# Patient Record
Sex: Female | Born: 1970 | Race: Black or African American | Hispanic: No | Marital: Married | Smoking: Never smoker
Health system: Southern US, Community
[De-identification: ages and names within clinical notes are randomized; demographics above are authoritative.]

## PROBLEM LIST (undated history)

## (undated) DIAGNOSIS — D219 Benign neoplasm of connective and other soft tissue, unspecified: Secondary | ICD-10-CM

## (undated) DIAGNOSIS — F329 Major depressive disorder, single episode, unspecified: Secondary | ICD-10-CM

## (undated) DIAGNOSIS — I10 Essential (primary) hypertension: Secondary | ICD-10-CM

## (undated) DIAGNOSIS — I639 Cerebral infarction, unspecified: Secondary | ICD-10-CM

## (undated) DIAGNOSIS — G10 Huntington's disease: Secondary | ICD-10-CM

## (undated) DIAGNOSIS — F419 Anxiety disorder, unspecified: Secondary | ICD-10-CM

## (undated) DIAGNOSIS — F32A Depression, unspecified: Secondary | ICD-10-CM

## (undated) HISTORY — PX: CHOLECYSTECTOMY: SHX55

## (undated) HISTORY — PX: LYMPH NODE DISSECTION: SHX5087

---

## 2008-04-10 ENCOUNTER — Emergency Department (HOSPITAL_COMMUNITY): Admission: EM | Admit: 2008-04-10 | Discharge: 2008-04-10 | Payer: Self-pay | Admitting: Emergency Medicine

## 2008-12-19 ENCOUNTER — Emergency Department (HOSPITAL_COMMUNITY): Admission: EM | Admit: 2008-12-19 | Discharge: 2008-12-19 | Payer: Self-pay | Admitting: Emergency Medicine

## 2009-03-27 ENCOUNTER — Emergency Department (HOSPITAL_COMMUNITY): Admission: EM | Admit: 2009-03-27 | Discharge: 2009-03-27 | Payer: Self-pay | Admitting: Emergency Medicine

## 2009-06-22 ENCOUNTER — Emergency Department (HOSPITAL_COMMUNITY): Admission: EM | Admit: 2009-06-22 | Discharge: 2009-06-22 | Payer: Self-pay | Admitting: Emergency Medicine

## 2010-01-01 ENCOUNTER — Emergency Department (HOSPITAL_COMMUNITY): Admission: EM | Admit: 2010-01-01 | Discharge: 2010-01-02 | Payer: Self-pay | Admitting: Emergency Medicine

## 2010-03-14 ENCOUNTER — Emergency Department (HOSPITAL_COMMUNITY): Admission: EM | Admit: 2010-03-14 | Discharge: 2010-03-15 | Payer: Self-pay | Admitting: Emergency Medicine

## 2010-05-02 ENCOUNTER — Emergency Department (HOSPITAL_COMMUNITY)
Admission: EM | Admit: 2010-05-02 | Discharge: 2010-05-02 | Payer: Self-pay | Source: Home / Self Care | Admitting: Emergency Medicine

## 2010-08-13 LAB — WET PREP, GENITAL
Clue Cells Wet Prep HPF POC: NONE SEEN
WBC, Wet Prep HPF POC: NONE SEEN
Yeast Wet Prep HPF POC: NONE SEEN

## 2010-08-13 LAB — URINALYSIS, ROUTINE W REFLEX MICROSCOPIC
Bilirubin Urine: NEGATIVE
Hgb urine dipstick: NEGATIVE
Specific Gravity, Urine: 1.034 — ABNORMAL HIGH (ref 1.005–1.030)
pH: 6 (ref 5.0–8.0)

## 2010-08-13 LAB — GC/CHLAMYDIA PROBE AMP, GENITAL: Chlamydia, DNA Probe: NEGATIVE

## 2010-08-15 LAB — CBC
HCT: 38 % (ref 36.0–46.0)
Hemoglobin: 12.6 g/dL (ref 12.0–15.0)
RDW: 13 % (ref 11.5–15.5)
WBC: 7.9 10*3/uL (ref 4.0–10.5)

## 2010-08-15 LAB — URINALYSIS, ROUTINE W REFLEX MICROSCOPIC
Bilirubin Urine: NEGATIVE
Glucose, UA: 100 mg/dL — AB
Hgb urine dipstick: NEGATIVE
Protein, ur: NEGATIVE mg/dL

## 2010-08-15 LAB — GC/CHLAMYDIA PROBE AMP, GENITAL: GC Probe Amp, Genital: NEGATIVE

## 2010-08-15 LAB — DIFFERENTIAL
Basophils Absolute: 0 10*3/uL (ref 0.0–0.1)
Lymphocytes Relative: 19 % (ref 12–46)
Monocytes Absolute: 0.4 10*3/uL (ref 0.1–1.0)
Monocytes Relative: 5 % (ref 3–12)
Neutro Abs: 6 10*3/uL (ref 1.7–7.7)

## 2010-08-15 LAB — POCT I-STAT, CHEM 8
BUN: 8 mg/dL (ref 6–23)
Calcium, Ion: 1.09 mmol/L — ABNORMAL LOW (ref 1.12–1.32)
Chloride: 104 mEq/L (ref 96–112)

## 2010-08-15 LAB — WET PREP, GENITAL

## 2010-08-16 LAB — POCT I-STAT, CHEM 8
Calcium, Ion: 1.19 mmol/L (ref 1.12–1.32)
Glucose, Bld: 199 mg/dL — ABNORMAL HIGH (ref 70–99)
HCT: 39 % (ref 36.0–46.0)
Hemoglobin: 13.3 g/dL (ref 12.0–15.0)
TCO2: 28 mmol/L (ref 0–100)

## 2010-08-16 LAB — URINALYSIS, ROUTINE W REFLEX MICROSCOPIC
Leukocytes, UA: NEGATIVE
Nitrite: NEGATIVE
Specific Gravity, Urine: >1.046 — ABNORMAL HIGH (ref 1.005–1.030)
Urobilinogen, UA: 1 mg/dL (ref 0.0–1.0)

## 2010-08-16 LAB — URINE MICROSCOPIC-ADD ON

## 2010-08-21 ENCOUNTER — Emergency Department (HOSPITAL_COMMUNITY)
Admission: EM | Admit: 2010-08-21 | Discharge: 2010-08-21 | Disposition: A | Discharge status: Home / Self Care | Payer: Worker's Compensation | Attending: Emergency Medicine | Admitting: Emergency Medicine

## 2010-08-21 ENCOUNTER — Emergency Department (HOSPITAL_COMMUNITY): Payer: Worker's Compensation

## 2010-08-21 DIAGNOSIS — Y9229 Other specified public building as the place of occurrence of the external cause: Secondary | ICD-10-CM | POA: Insufficient documentation

## 2010-08-21 DIAGNOSIS — M79609 Pain in unspecified limb: Secondary | ICD-10-CM | POA: Insufficient documentation

## 2010-08-21 DIAGNOSIS — I10 Essential (primary) hypertension: Secondary | ICD-10-CM | POA: Insufficient documentation

## 2010-08-21 DIAGNOSIS — IMO0002 Reserved for concepts with insufficient information to code with codable children: Secondary | ICD-10-CM | POA: Insufficient documentation

## 2010-08-21 DIAGNOSIS — E119 Type 2 diabetes mellitus without complications: Secondary | ICD-10-CM | POA: Insufficient documentation

## 2010-08-21 DIAGNOSIS — S93609A Unspecified sprain of unspecified foot, initial encounter: Secondary | ICD-10-CM | POA: Insufficient documentation

## 2010-08-21 DIAGNOSIS — S93409A Sprain of unspecified ligament of unspecified ankle, initial encounter: Secondary | ICD-10-CM | POA: Insufficient documentation

## 2010-08-21 DIAGNOSIS — W108XXA Fall (on) (from) other stairs and steps, initial encounter: Secondary | ICD-10-CM | POA: Insufficient documentation

## 2010-08-21 DIAGNOSIS — M25579 Pain in unspecified ankle and joints of unspecified foot: Secondary | ICD-10-CM | POA: Insufficient documentation

## 2010-08-21 DIAGNOSIS — M25569 Pain in unspecified knee: Secondary | ICD-10-CM | POA: Insufficient documentation

## 2010-08-21 DIAGNOSIS — X500XXA Overexertion from strenuous movement or load, initial encounter: Secondary | ICD-10-CM | POA: Insufficient documentation

## 2010-09-08 LAB — URINALYSIS, ROUTINE W REFLEX MICROSCOPIC
Bilirubin Urine: NEGATIVE
Hgb urine dipstick: NEGATIVE
Ketones, ur: NEGATIVE mg/dL
Protein, ur: NEGATIVE mg/dL
Urobilinogen, UA: 0.2 mg/dL (ref 0.0–1.0)

## 2010-09-23 ENCOUNTER — Other Ambulatory Visit: Payer: Self-pay | Admitting: Obstetrics and Gynecology

## 2010-10-02 ENCOUNTER — Other Ambulatory Visit: Payer: Self-pay | Admitting: Obstetrics and Gynecology

## 2010-10-02 ENCOUNTER — Ambulatory Visit
Admission: RE | Admit: 2010-10-02 | Discharge: 2010-10-02 | Disposition: A | Discharge status: Home / Self Care | Payer: Worker's Compensation | Source: Ambulatory Visit | Attending: Obstetrics and Gynecology | Admitting: Obstetrics and Gynecology

## 2010-11-18 ENCOUNTER — Encounter: Payer: BC Managed Care – PPO | Attending: Endocrinology

## 2010-11-18 DIAGNOSIS — E119 Type 2 diabetes mellitus without complications: Secondary | ICD-10-CM | POA: Insufficient documentation

## 2010-11-18 DIAGNOSIS — Z713 Dietary counseling and surveillance: Secondary | ICD-10-CM | POA: Insufficient documentation

## 2010-12-10 ENCOUNTER — Encounter: Payer: Worker's Compensation | Attending: Endocrinology

## 2010-12-10 DIAGNOSIS — E119 Type 2 diabetes mellitus without complications: Secondary | ICD-10-CM | POA: Insufficient documentation

## 2010-12-10 DIAGNOSIS — Z713 Dietary counseling and surveillance: Secondary | ICD-10-CM | POA: Insufficient documentation

## 2010-12-12 NOTE — Progress Notes (Signed)
  Patient was seen on 12/10/2010 for the second of a series of three diabetes self-management courses at the [facility]. The following learning objectives were met by the patient during this course:   States the relationship of exercise to blood glucose  States benefits/barriers of regular and safe exercise  States three guidelines for safe and effective exercise  Describes personal diabetes medicine regimen  Describes actions of own medications  Describes causes, symptoms, and treatment of hypo/hyperglycemia  Describes sick day rules  Identifies when to test urine for ketones when appropriate  Identifies when to call healthcare provider for acute complications  States the risk for problems with foot, skin, and dental care  States preventative foot, skin, and dental care measures  States when to call healthcare provider regarding foot, skin, and dental care  Identifies methods for evaluation of diabetes control  Discusses benefits of SBGM  Identifies relationship between nutrition, exercise, medication, and glucose levels  Discusses the importance of record keeping  Follow-Up Plan: Patient will attend the final class of the ADA Diabetes Self-Care Education.

## 2011-03-04 LAB — CBC
HCT: 39.7
Hemoglobin: 13.4
MCV: 94.2
Platelets: 304
RDW: 12

## 2011-03-04 LAB — DIFFERENTIAL
Basophils Absolute: 0
Basophils Relative: 0
Lymphocytes Relative: 8 — ABNORMAL LOW
Monocytes Absolute: 0.4
Monocytes Relative: 3
Neutro Abs: 12.1 — ABNORMAL HIGH
Neutrophils Relative %: 89 — ABNORMAL HIGH

## 2011-03-04 LAB — URINALYSIS, ROUTINE W REFLEX MICROSCOPIC
Hgb urine dipstick: NEGATIVE
Protein, ur: NEGATIVE
Specific Gravity, Urine: 1.03
Urobilinogen, UA: 1

## 2011-03-04 LAB — COMPREHENSIVE METABOLIC PANEL
Albumin: 4
BUN: 8
Creatinine, Ser: 0.84
Glucose, Bld: 189 — ABNORMAL HIGH
Total Bilirubin: 0.7
Total Protein: 6.9

## 2011-03-04 LAB — GLUCOSE, CAPILLARY: Glucose-Capillary: 196 — ABNORMAL HIGH

## 2011-03-04 LAB — POCT PREGNANCY, URINE: Preg Test, Ur: NEGATIVE

## 2011-04-11 ENCOUNTER — Encounter: Payer: Self-pay | Admitting: Emergency Medicine

## 2011-04-11 ENCOUNTER — Emergency Department (HOSPITAL_COMMUNITY)
Admission: EM | Admit: 2011-04-11 | Discharge: 2011-04-11 | Disposition: A | Discharge status: Home / Self Care | Payer: Worker's Compensation | Attending: Emergency Medicine | Admitting: Emergency Medicine

## 2011-04-11 DIAGNOSIS — I10 Essential (primary) hypertension: Secondary | ICD-10-CM | POA: Insufficient documentation

## 2011-04-11 DIAGNOSIS — E119 Type 2 diabetes mellitus without complications: Secondary | ICD-10-CM | POA: Insufficient documentation

## 2011-04-11 DIAGNOSIS — M542 Cervicalgia: Secondary | ICD-10-CM | POA: Insufficient documentation

## 2011-04-11 DIAGNOSIS — Y92410 Unspecified street and highway as the place of occurrence of the external cause: Secondary | ICD-10-CM | POA: Insufficient documentation

## 2011-04-11 DIAGNOSIS — T148XXA Other injury of unspecified body region, initial encounter: Secondary | ICD-10-CM

## 2011-04-11 HISTORY — DX: Essential (primary) hypertension: I10

## 2011-04-11 MED ORDER — IBUPROFEN 800 MG PO TABS: 800.0000 mg | ORAL_TABLET | Freq: Three times a day (TID) | ORAL | Status: AC | PRN

## 2011-04-11 MED ORDER — METHOCARBAMOL 500 MG PO TABS: 1000.0000 mg | ORAL_TABLET | Freq: Four times a day (QID) | ORAL | Status: AC
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
## 2011-04-11 NOTE — ED Notes (Signed)
Right neck pain with radiation to shoulder.  Aleve this AM and helped mildly. 4/10.
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
## 2011-04-12 NOTE — ED Provider Notes (Signed)
Medical screening examination/treatment/procedure(s) were performed by non-physician practitioner and as supervising physician I was immediately available for consultation/collaboration.  Doug Sou, MD 04/12/11 [phone]

## 2011-08-03 ENCOUNTER — Emergency Department (HOSPITAL_COMMUNITY)
Admission: EM | Admit: 2011-08-03 | Discharge: 2011-08-03 | Disposition: A | Discharge status: Home / Self Care | Payer: Self-pay | Attending: Emergency Medicine | Admitting: Emergency Medicine

## 2011-08-03 ENCOUNTER — Encounter (HOSPITAL_COMMUNITY): Payer: Self-pay | Admitting: *Deleted

## 2011-08-03 DIAGNOSIS — H9209 Otalgia, unspecified ear: Secondary | ICD-10-CM | POA: Insufficient documentation

## 2011-08-03 DIAGNOSIS — I10 Essential (primary) hypertension: Secondary | ICD-10-CM | POA: Insufficient documentation

## 2011-08-03 DIAGNOSIS — R51 Headache: Secondary | ICD-10-CM | POA: Insufficient documentation

## 2011-08-03 DIAGNOSIS — G629 Polyneuropathy, unspecified: Secondary | ICD-10-CM

## 2011-08-03 DIAGNOSIS — E119 Type 2 diabetes mellitus without complications: Secondary | ICD-10-CM | POA: Insufficient documentation

## 2011-08-03 DIAGNOSIS — G589 Mononeuropathy, unspecified: Secondary | ICD-10-CM | POA: Insufficient documentation

## 2011-08-03 DIAGNOSIS — J029 Acute pharyngitis, unspecified: Secondary | ICD-10-CM | POA: Insufficient documentation

## 2011-08-03 LAB — GLUCOSE, CAPILLARY

## 2011-08-03 LAB — RAPID STREP SCREEN (MED CTR MEBANE ONLY): Streptococcus, Group A Screen (Direct): NEGATIVE

## 2011-08-03 MED ORDER — HYDROCODONE-ACETAMINOPHEN 5-325 MG PO TABS: 1.0000 | ORAL_TABLET | ORAL | Status: AC | PRN

## 2011-08-03 NOTE — ED Provider Notes (Signed)
History     CSN: [id number]  Arrival date & time 08/03/11  4098   First MD Initiated Contact with Patient 08/03/11 1919      Chief Complaint  Patient presents with  . multiple complaints     (Consider location/radiation/quality/duration/timing/severity/associated sxs/prior treatment) HPI The patient complains of headache, earache and throat pain for the last 4 days. She feels the right side of her face and neck area is swollen. She feels like the glands in that area are enlarged as well. She also has noted that about 3 or 4 days ago 3 toes on her left foot are numb. That has subsequently resolved. She denies any trouble with her speech, coordination, balance. She has had a head cold as well for the last few days. She denies abdominal pain or chest pain. She has no history of stroke. Past Medical History  Diagnosis Date  . Hypertension   . Diabetes mellitus     Past Surgical History  Procedure Date  . Cholecystectomy     History reviewed. No pertinent family history.  History  Substance Use Topics  . Smoking status: Never Smoker   . Smokeless tobacco: Never Used  . Alcohol Use: No    OB History    Grav Para Term Preterm Abortions TAB SAB Ect Mult Living                  Review of Systems  All other systems reviewed and are negative.    Allergies  Review of patient's allergies indicates no known allergies.  Home Medications   Current Outpatient Rx  Name Route Sig Dispense Refill  . ASPIRIN EC 81 MG PO TBEC Oral Take 81 mg by mouth daily.      Marland Kitchen VITAMIN D PO Oral Take 1 tablet by mouth daily.    Marland Kitchen CINNAMON PO Oral Take 1 tablet by mouth 2 (two) times daily.    Marland Kitchen VITAMIN B-12 PO Oral Take 1 tablet by mouth daily.    . OMEGA-3 FATTY ACIDS 1000 MG PO CAPS Oral Take 1 g by mouth daily.    Marland Kitchen GLIPIZIDE ER 10 MG PO TB24 Oral Take 10 mg by mouth daily.    Marland Kitchen LISINOPRIL-HYDROCHLOROTHIAZIDE 10-12.5 MG PO TABS Oral Take 1 tablet by mouth daily.        BP 129/76  Pulse  100  Temp(Src) 98.8 F (37.1 C) (Oral)  Resp 18  SpO2 98%  LMP 08/01/2011  Physical Exam  Nursing note and vitals reviewed. Constitutional: She appears well-developed and well-nourished. No distress.  HENT:  Head: Normocephalic and atraumatic.  Right Ear: Tympanic membrane, external ear and ear canal normal.  Left Ear: Tympanic membrane, external ear and ear canal normal.  Eyes: Conjunctivae are normal. Right eye exhibits no discharge. Left eye exhibits no discharge. No scleral icterus.  Neck: Neck supple. No rigidity. No tracheal deviation and no edema present.       Tenderness anterior cervical chain primarily on the right side, no significant lymphadenopathy appreciated  Cardiovascular: Normal rate, regular rhythm and intact distal pulses.   Pulmonary/Chest: Effort normal and breath sounds normal. No stridor. No respiratory distress. She has no wheezes. She has no rales.  Abdominal: Soft. Bowel sounds are normal. She exhibits no distension. There is no tenderness. There is no rebound and no guarding.  Musculoskeletal: She exhibits no edema and no tenderness.  Neurological: She is alert. She has normal strength. She displays no atrophy and no tremor. No cranial nerve deficit (  no gross defecits noted) or sensory deficit. She exhibits normal muscle tone. She displays no seizure activity. Coordination and gait normal. GCS eye subscore is 4. GCS verbal subscore is 5. GCS motor subscore is 6.       Normal sensation throughout, 5 over 5 strength upper extremities and lower extremities, extraocular concern tach, no visual field defects,  Skin: Skin is warm and dry. No rash noted.  Psychiatric: She has a normal mood and affect.    ED Course  Procedures (including critical care time)  Labs Reviewed  GLUCOSE, CAPILLARY - Abnormal; Notable for the following:    Glucose-Capillary 132 (*)    All other components within normal limits  RAPID STREP SCREEN  LAB REPORT - SCANNED   No results  found.    MDM  Patient mentions an incidental numbness in her toes. There is no evidence to suggest stroke. I doubt TIA. She does have diabetes and may be developing neuropathy. At this time I do not feel there is an acute emergency medical condition associated with this complaint do recommend that she followup with her primary doctor.  Patient does have pharyngitis. Will check a strep test. It is possible that this is a viral type infection. There is no evidence of otitis externa or otitis media. There is no abscess on her exam        Celene Kras, MD 08/05/11 [phone]

## 2011-08-03 NOTE — ED Notes (Signed)
CBG=132 mg/ml

## 2011-08-03 NOTE — ED Notes (Signed)
Received bedside report from [place], [place].  Patient currently sitting up in bed; no respiratory or acute distress noted.  Introduced self to patient and updated patient on plan of care; informed patient that EDP will be in to assess patient.  Patient has no other questions or concerns at this time; will continue to monitor.

## 2011-08-03 NOTE — ED Provider Notes (Signed)
Hayley Bryant S 8:00 PM Patient discussed in sign out with Dr. Lynelle Doctor. Patient with complaints of earache, sore throat mild general fatigue and left toes numbness and tingling. Strep screen pending. Patient's sugars day unremarkable. She is known diabetic. Patient had normal nonfocal neurologic exam. Numbness in toes thought possibly to be related to diabetes and neuropathy. No concerning findings on exam for PTA or retropharyngeal abscess. Plan, if strep screen negative give symptomatic treatment. If strep screen positive give appropriate antibiotic treatment.   8:30 PM strep screen is negative. I discussed results with patient and treatment plan for symptomatic treatment. She will followup with PCP.  Angus Seller, [place] 08/03/11 2047

## 2011-08-03 NOTE — ED Notes (Signed)
Patient currently resting quietly in bed; no respiratory or acute distress noted; patient updated on plan of care; informed patient that we are currently waiting for strep screen to come back.  Patient given water; OKed by Dr. Lynelle Doctor.  Patient has no other questions or concerns at this time; will continue to monitor.

## 2011-08-03 NOTE — ED Notes (Signed)
Pt c/o a rt sided headache earache and throat pain for 4 days and her beck is swiollen.  She also had 3 toes numb on her lt foot 3-4 days ago .  Not now

## 2011-08-03 NOTE — ED Notes (Addendum)
Pt c/o "head cold," and pain to (R) side of head, throat, neck, and ear, non-productive cough, and swelling to (R) side of neck x4-5 days. Pt also reports numbness to first 3 toes on (L) foot 3-4 days ago for approx 90 mins, denies any numbness today but had brief episode of pain to (L) great toe yesterday

## 2011-08-03 NOTE — ED Notes (Addendum)
Pt reports her (R) ear pain is the only thing hurting at this moment, head and jaw pain has subsided, throat hurts only when she swallows

## 2011-08-03 NOTE — Discharge Instructions (Signed)
You were seen and evaluated today for your symptoms of earache, sore throat and toe numbness. At this time your strep throat test was negative. Your providers today feel that you're here in throat symptoms are likely caused from a viral infection. Your symptoms should improve with rest over the next few days. You may use Tylenol or ibuprofen during the day for your symptoms of pain. You were given a prescription for stronger pain medicine to help you at night if you have difficulty sleeping due to your symptoms of pain. Do not drive or operate machinery while taking this medicine as it may cause you to be drowsy. Drink plenty of fluids to stay hydrated. You develop any worsening symptoms, fever, chills, and difficulty swallowing or breathing please return to the emergency room. Continue to followup with your primary care provider for your diabetes treatment and continued evaluation of your numbness in your toes. Providers today feel this may be related to your diabetes but may also require further workup by her primary doctor.  Viral Infections A viral infection can be caused by different types of viruses.Most viral infections are not serious and resolve on their own. However, some infections may cause severe symptoms and may lead to further complications. SYMPTOMS Viruses can frequently cause:  Minor sore throat.   Aches and pains.   Headaches.   Runny nose.   Different types of rashes.   Watery eyes.   Tiredness.   Cough.   Loss of appetite.   Gastrointestinal infections, resulting in nausea, vomiting, and diarrhea.  These symptoms do not respond to antibiotics because the infection is not caused by bacteria. However, you might catch a bacterial infection following the viral infection. This is sometimes called a "superinfection." Symptoms of such a bacterial infection may include:  Worsening sore throat with pus and difficulty swallowing.   Swollen neck glands.   Chills and a high  or persistent fever.   Severe headache.   Tenderness over the sinuses.   Persistent overall ill feeling (malaise), muscle aches, and tiredness (fatigue).   Persistent cough.   Yellow, green, or brown mucus production with coughing.  HOME CARE INSTRUCTIONS   Only take over-the-counter or prescription medicines for pain, discomfort, diarrhea, or fever as directed by your caregiver.   Drink enough water and fluids to keep your urine clear or pale yellow. Sports drinks can provide valuable electrolytes, sugars, and hydration.   Get plenty of rest and maintain proper nutrition. Soups and broths with crackers or rice are fine.  SEEK IMMEDIATE MEDICAL CARE IF:   You have severe headaches, shortness of breath, chest pain, neck pain, or an unusual rash.   You have uncontrolled vomiting, diarrhea, or you are unable to keep down fluids.   You or your child has an oral temperature above 102 F (38.9 C), not controlled by medicine.   Your baby is older than 3 months with a rectal temperature of 102 F (38.9 C) or higher.   Your baby is 18 months old or younger with a rectal temperature of 100.4 F (38 C) or higher.  MAKE SURE YOU:   Understand these instructions.   Will watch your condition.   Will get help right away if you are not doing well or get worse.  Document Released: 02/26/2005 Document Revised: 05/08/2011 Document Reviewed: 09/23/2010 [facility] Patient Information 2012 [place], [place].  Sore Throat A sore throat is felt inside the throat and at the back of the mouth. It hurts to swallow or  the throat may feel dry and scratchy. It can be caused by germs, smoking, pollution, or allergies.  HOME CARE   Only take medicine as told by your doctor.   Drink enough fluids to keep your pee (urine) clear or pale yellow.   Eat soft foods.   Do not smoke.   Rinse the mouth (gargle) with warm water or salt water ( teaspoon salt in 8 ounces of water).   Try throat sprays,  lozenges, or suck on hard candy.  GET HELP RIGHT AWAY IF:   You have trouble breathing.   Your sore throat lasts longer than 1 week.   There is more puffiness (swelling) in the throat.   The pain is so bad that you are unable to swallow.   You have a very bad headache or a red rash.   You start to throw up (vomit).   You or your child has a temperature by mouth above 102 F (38.9 C), not controlled by medicine.   Your baby is older than 3 months with a rectal temperature of 102 F (38.9 C) or higher.   Your baby is 30 months old or younger with a rectal temperature of 100.4 F (38 C) or higher.  MAKE SURE YOU:   Understand these instructions.   Will watch your condition.   Will get help right away if you are not doing well or get worse.  Document Released: 02/26/2008 Document Revised: 05/08/2011 Document Reviewed: 02/26/2008 [facility] Patient Information 2012 [place], [place].  Otalgia The most common reason for this in children is an infection of the middle ear. Pain from the middle ear is usually caused by a build-up of fluid and pressure behind the eardrum. Pain from an earache can be sharp, dull, or burning. The pain may be temporary or constant. The middle ear is connected to the nasal passages by a short narrow tube called the Eustachian tube. The Eustachian tube allows fluid to drain out of the middle ear, and helps keep the pressure in your ear equalized. CAUSES  A cold or allergy can block the Eustachian tube with inflammation and the build-up of secretions. This is especially likely in small children, because their Eustachian tube is shorter and more horizontal. When the Eustachian tube closes, the normal flow of fluid from the middle ear is stopped. Fluid can accumulate and cause stuffiness, pain, hearing loss, and an ear infection if germs start growing in this area. SYMPTOMS  The symptoms of an ear infection may include fever, ear pain, fussiness, increased crying, and  irritability. Many children will have temporary and minor hearing loss during and right after an ear infection. Permanent hearing loss is rare, but the risk increases the more infections a child has. Other causes of ear pain include retained water in the outer ear canal from swimming and bathing. Ear pain in adults is less likely to be from an ear infection. Ear pain may be referred from other locations. Referred pain may be from the joint between your jaw and the skull. It may also come from a tooth problem or problems in the neck. Other causes of ear pain include:  A foreign body in the ear.   Outer ear infection.   Sinus infections.   Impacted ear wax.   Ear injury.   Arthritis of the jaw or TMJ problems.   Middle ear infection.   Tooth infections.   Sore throat with pain to the ears.  DIAGNOSIS  Your caregiver can usually make  the diagnosis by examining you. Sometimes other special studies, including x-rays and lab work may be necessary. TREATMENT   If antibiotics were prescribed, use them as directed and finish them even if you or your child's symptoms seem to be improved.   Sometimes PE tubes are needed in children. These are little plastic tubes which are put into the eardrum during a simple surgical procedure. They allow fluid to drain easier and allow the pressure in the middle ear to equalize. This helps relieve the ear pain caused by pressure changes.  HOME CARE INSTRUCTIONS   Only take over-the-counter or prescription medicines for pain, discomfort, or fever as directed by your caregiver. DO NOT GIVE CHILDREN ASPIRIN because of the association of Reye's Syndrome in children taking aspirin.   Use a cold pack applied to the outer ear for 15 to 20 minutes, 3 to 4 times per day or as needed may reduce pain. Do not apply ice directly to the skin. You may cause frost bite.   Over-the-counter ear drops used as directed may be effective. Your caregiver may sometimes prescribe ear  drops.   Resting in an upright position may help reduce pressure in the middle ear and relieve pain.   Ear pain caused by rapidly descending from high altitudes can be relieved by swallowing or chewing gum. Allowing infants to suck on a bottle during airplane travel can help.   Do not smoke in the house or near children. If you are unable to quit smoking, smoke outside.   Control allergies.  SEEK IMMEDIATE MEDICAL CARE IF:   You or your child are becoming sicker.   Pain or fever relief is not obtained with medicine.   You or your child's symptoms (pain, fever, or irritability) do not improve within 24 to 48 hours or as instructed.   Severe pain suddenly stops hurting. This may indicate a ruptured eardrum.   You or your children develop new problems such as severe headaches, stiff neck, difficulty swallowing, or swelling of the face or around the ear.  Document Released: 01/04/2004 Document Revised: 05/08/2011 Document Reviewed: 05/10/2008 [facility] Patient Information 2012 [place], [place].     Peripheral Nerve Problems Peripheral nerve disorders are problems with the nerves in your arms or legs. CAUSES  There are many different causes of these disorders. They include:  Injury.   Diabetes.   Chronic alcoholism.   Toxic chemicals and drugs.   Vitamin deficiencies.   Tumors.   Liver or kidney diseases.  SYMPTOMS  Some of the problems caused are:  Tingling, burning, pain, and numbness in the extremities and feet.   Weakness, loss of muscle tone, and size.  DIAGNOSIS  Sometimes blood tests and studies to examine nerve function are needed to make the diagnosis.  TREATMENT  Sometimes peripheral nerve problems can be treated with vitamins, medication, and avoiding known toxins such as alcohol. Please make a follow-up appointment to be sure you are getting better with treatment.  SEEK MEDICAL CARE IF:   You are not better after one week of treatment.   You have  worsening of problems or have breathing trouble.  Document Released: 06/26/2004 Document Revised: 05/08/2011 Document Reviewed: 05/19/2005 [facility] Patient Information 2012 [place], [place].

## 2011-08-05 NOTE — ED Provider Notes (Signed)
Medical screening examination/treatment/procedure(s) were conducted as a shared visit with non-physician practitioner(s) and myself.  I personally evaluated the patient during the encounter    Celene Kras, MD 08/05/11 [phone]

## 2011-10-10 IMAGING — CR DG ANKLE COMPLETE 3+V*R*
3 series · 3 of 3 positions shown · non-contrast
Comparison: None.

CLINICAL DATA: Right ankle pain.  Fall.

RIGHT ANKLE - COMPLETE 3+ VIEW

[t ankle joint ap right]
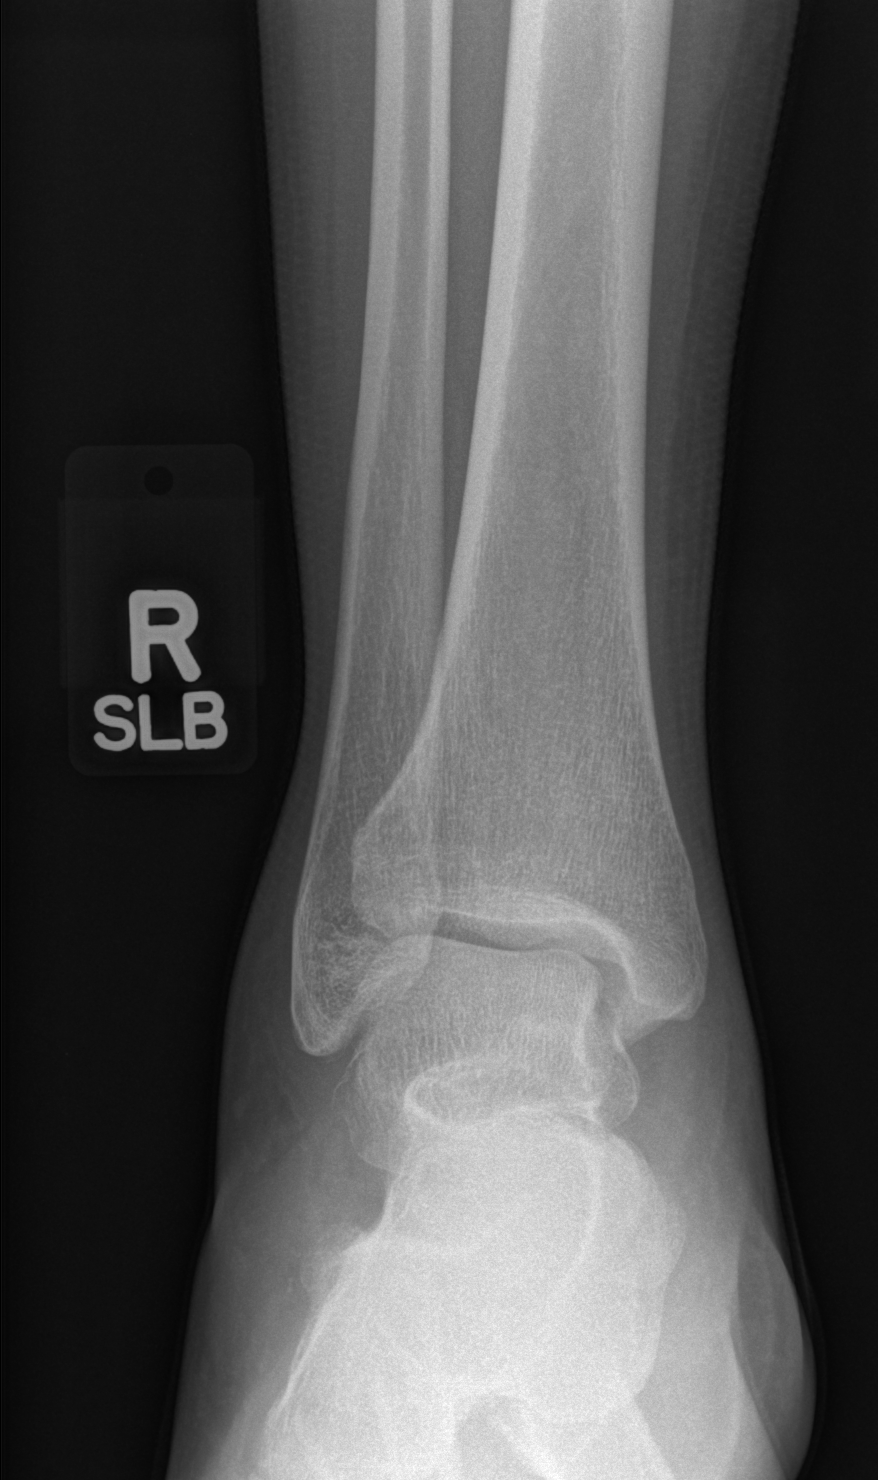

[t ankle joint oblique right]
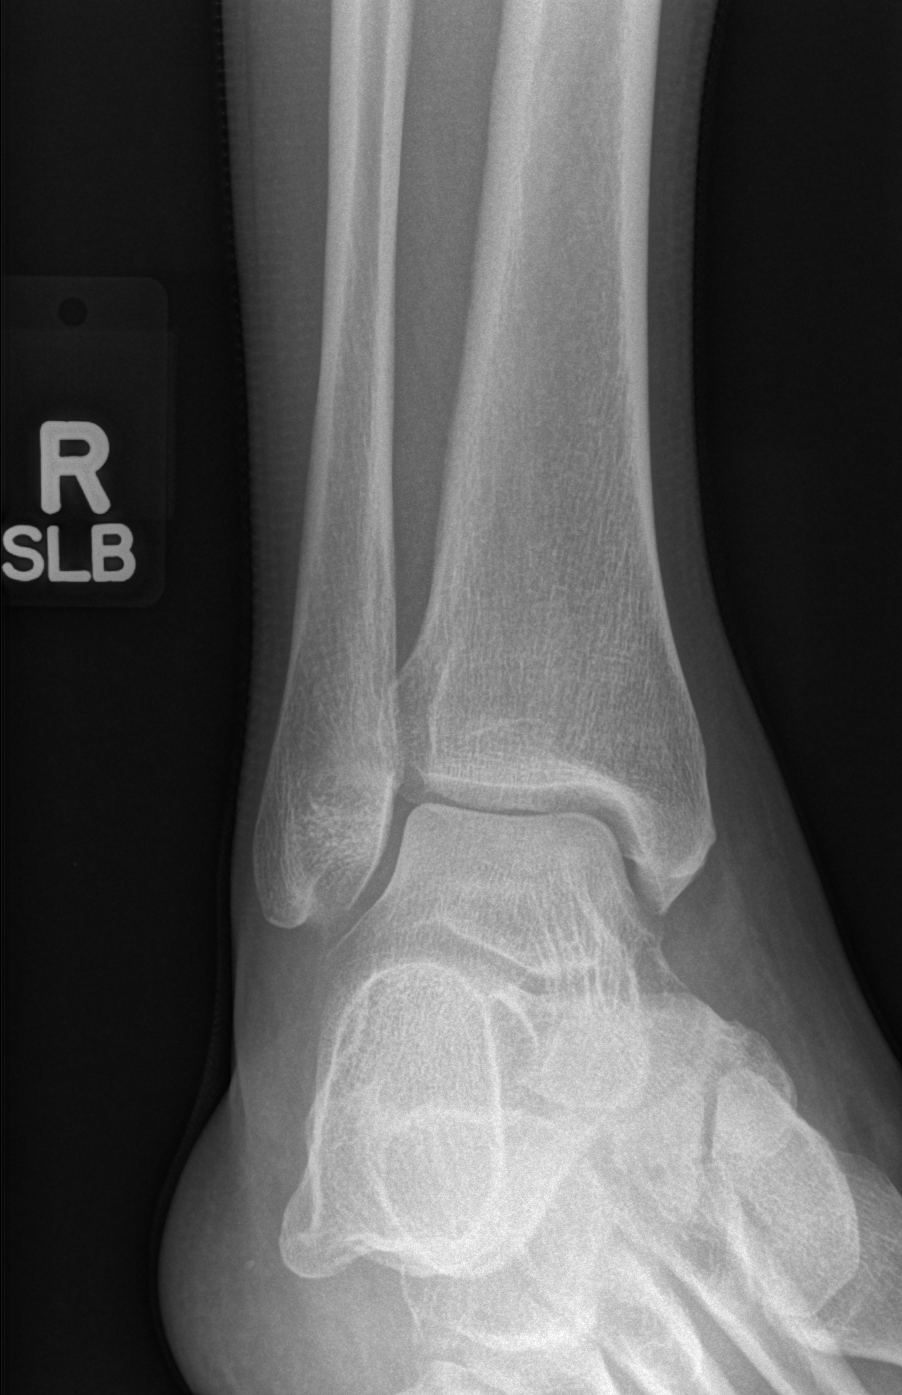

[t ankle joint lat right]
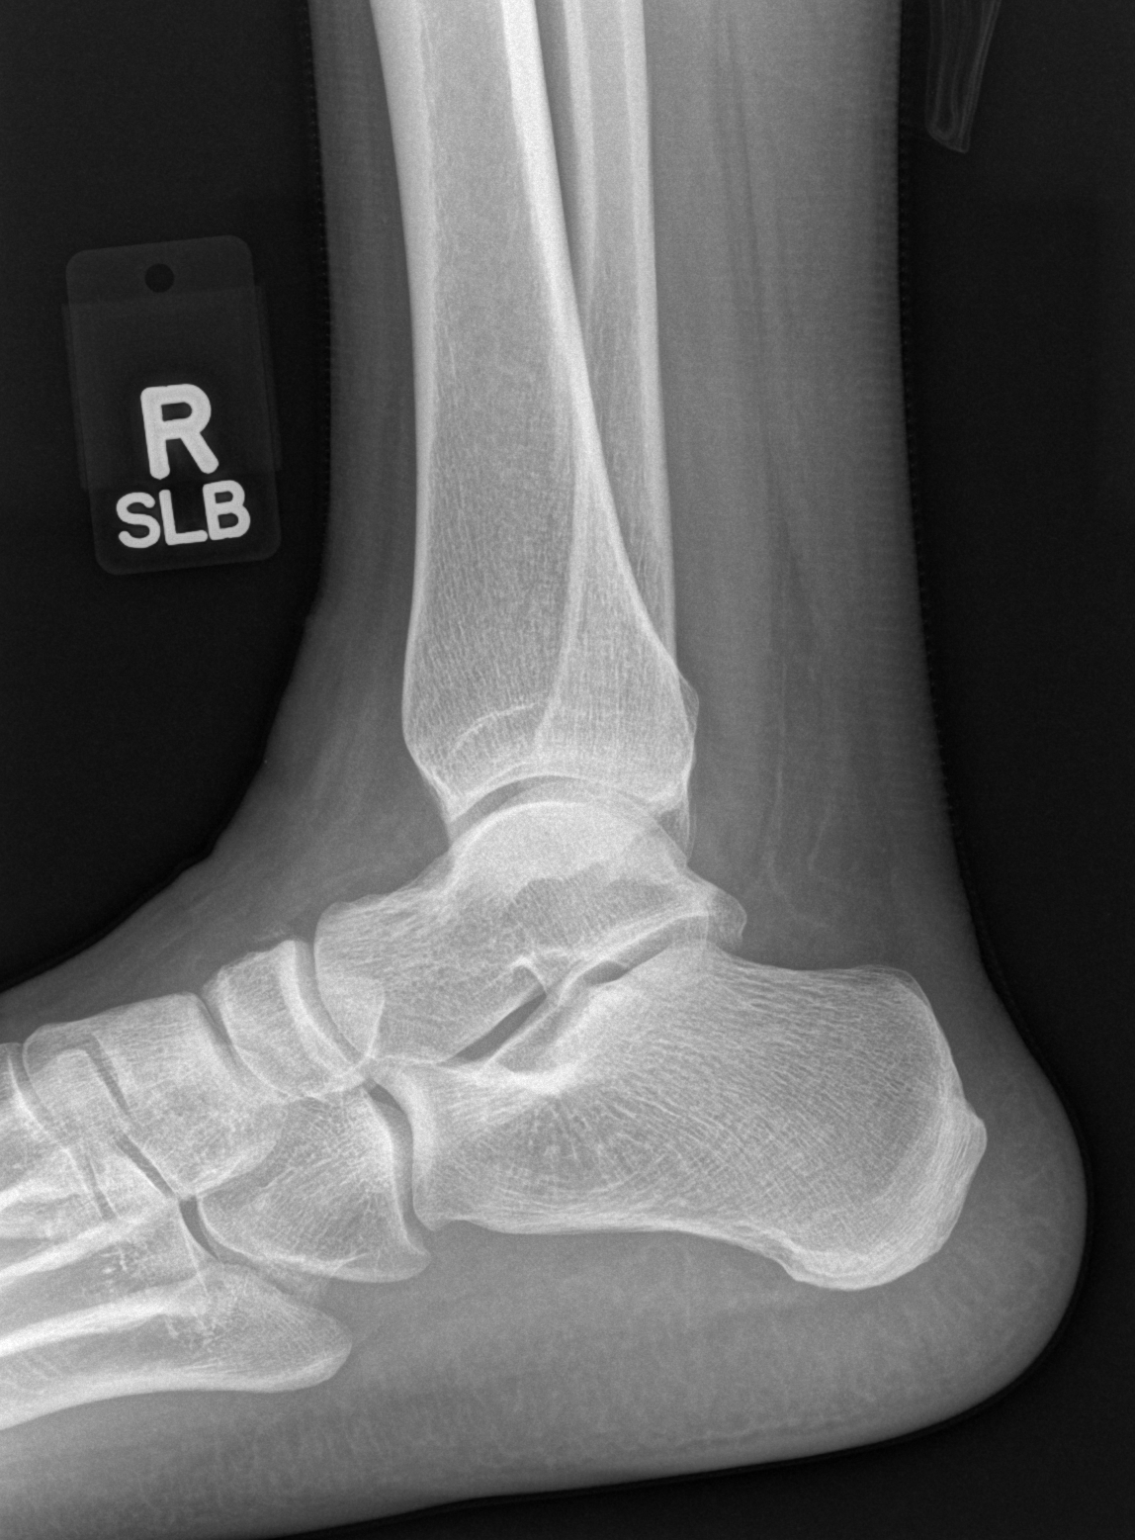

[3 of 3 positions shown; findings below may reference images not displayed]

FINDINGS: There is a suggestion of soft tissue swelling below the
malleoli, particularly laterally.

The plafond and talar dome appear intact.  No fractures identified.
No acute bony findings.
IMPRESSION: 1.  Mild soft tissue swelling below the malleoli.  No fracture
identified.

## 2011-12-01 ENCOUNTER — Emergency Department (HOSPITAL_COMMUNITY)
Admission: EM | Admit: 2011-12-01 | Discharge: 2011-12-02 | Disposition: A | Discharge status: Home / Self Care | Payer: Managed Care, Other (non HMO) | Attending: Emergency Medicine | Admitting: Emergency Medicine

## 2011-12-01 ENCOUNTER — Encounter (HOSPITAL_COMMUNITY): Payer: Self-pay | Admitting: *Deleted

## 2011-12-01 DIAGNOSIS — M549 Dorsalgia, unspecified: Secondary | ICD-10-CM

## 2011-12-01 DIAGNOSIS — E119 Type 2 diabetes mellitus without complications: Secondary | ICD-10-CM | POA: Insufficient documentation

## 2011-12-01 DIAGNOSIS — R109 Unspecified abdominal pain: Secondary | ICD-10-CM | POA: Insufficient documentation

## 2011-12-01 DIAGNOSIS — I10 Essential (primary) hypertension: Secondary | ICD-10-CM | POA: Insufficient documentation

## 2011-12-01 LAB — URINALYSIS, ROUTINE W REFLEX MICROSCOPIC
Glucose, UA: >1000 mg/dL — AB
Leukocytes, UA: NEGATIVE
Nitrite: NEGATIVE
Specific Gravity, Urine: 1.04 — ABNORMAL HIGH (ref 1.005–1.030)
pH: 6 (ref 5.0–8.0)

## 2011-12-01 LAB — POCT PREGNANCY, URINE: Preg Test, Ur: NEGATIVE

## 2011-12-01 LAB — URINE MICROSCOPIC-ADD ON

## 2011-12-01 LAB — GLUCOSE, CAPILLARY: Glucose-Capillary: 271 mg/dL — ABNORMAL HIGH (ref 70–99)

## 2011-12-01 NOTE — ED Notes (Addendum)
Patient states bilateral flank pain x 1 day, patient also states pain with urination

## 2011-12-02 LAB — POCT I-STAT, CHEM 8
Chloride: 99 mEq/L (ref 96–112)
Creatinine, Ser: 0.7 mg/dL (ref 0.50–1.10)
Glucose, Bld: 257 mg/dL — ABNORMAL HIGH (ref 70–99)
Hemoglobin: 12.9 g/dL (ref 12.0–15.0)
Potassium: 3.5 mEq/L (ref 3.5–5.1)

## 2011-12-02 NOTE — Discharge Instructions (Signed)
Take Tylenol as needed for pain. Your blood sugar was slightly high today at 257. Your urine was sent for culture. If an infection shows in a few days but was not seen on tonight's test we will call an antibiotic for you. Call the resource guide to get a new primary care Doctor.  RESOURCE GUIDE  Chronic Pain Problems: Contact [facility]  [phone] Patients need to be referred by their primary care doctor.  Insufficient Money for Medicine: Contact United Way:  call "211" or [facility] [phone].  No Primary Care Doctor: - Call [facility]  [phone] - can help you locate a primary care doctor that  accepts your insurance, provides certain services, etc. - Physician Referral Service- [phone]  Agencies that provide inexpensive medical care: - [facility]  [phone] - [facility]  [phone] - [facility]  [phone] - Women's Clinic  [phone] - [facility]  [phone] [facility]  [phone]  Medicaid-accepting [facility] Providers: - [facility]- [address]  [phone], Mon-Fri 9am-7pm, Sat 9am-1pm - [facility]- [address] [place], [address] [place]  [phone] - [facility]- [address] [place]  [phone] [facility]- [address]  [phone] - Renaye Rakers- [address] [place], [address], [phone]  Only accepts [place] patients after they have their name  applied to their card  Self Pay (no insurance) in [place]: - Sickle Cell Patients: Dr Willey Blade, [facility]  [address] [place], [phone] - [facility]- [address] [place]  [phone]       [facility] [place]- [address]       -     [facility]- see information above (Speak to [employer] if you do not have insurance)       -   [facility]- [address] [place], [phone]       -  [facility]- [address] [place],  [phone]       -  [facility]- [address], [phone]       -  Dr Julio Sicks-  [address], [place], [phone]       -  [facility]- [address], [phone]       -  [facility]- [address] [phone], also [address] [phone]       -    [facility]- [address] [place], [phone], 1st & 3rd Saturday   every month, 10am-1pm  1) Find a Doctor and Pay Out of Pocket Although you won't have to find out who is covered by your insurance plan, it is a good idea to ask around and get recommendations. You will then need to call the office and see if the doctor you have chosen will accept you as a new patient and what types of options they offer for patients who are self-pay. Some doctors offer discounts or will set up payment plans for their patients who do not have insurance, but you will need to ask so you aren't surprised when you get to your appointment.  2) Contact Your Local Health Department Not all health departments have doctors that can see  patients for sick visits, but many do, so it is worth a call to see if yours does. If you don't know where your local health department is, you can check in your phone book. The CDC also has a tool to help you locate your state's health department, and many state websites also have listings of all of their local health departments.  3) Find a Walk-in Clinic If your illness is not likely to be very severe or complicated, you may want to try a walk in clinic. These are popping up all over the country in pharmacies, drugstores, and shopping centers. They're usually staffed by nurse practitioners or physician assistants that have been trained to treat common illnesses and complaints. They're usually fairly quick and inexpensive. However, if you have serious medical issues or  chronic medical problems, these are probably not your best option  STD Testing - [facility] Department of [facility] [place], STD Clinic, [address] [place], phone [phone] or [phone].  Monday - Friday, call for an appointment. [facility] Department of [employer], STD Clinic, [place] [address], [place], phone [phone] or [phone].  Monday - Friday, call for an appointment.  Abuse/Neglect: [facility] Child Abuse Hotline 250-624-8709 [facility] Child Abuse Hotline (443) 200-8389 (After Hours)  Emergency Shelter:  [facility] 201-600-8386  Maternity Homes: - [facility] at [facility] [place] [facility] [phone] - [facility] ([phone]) [phone]  MRSA Hotline #:   325-203-7893  [facility] Resources  [facility] [place]  [facility] Dept. [address]                 [address]         [address] [place] [address]  Blondell Reveal Phone:  [phone]                                  Phone:  [phone]                   Phone:  [phone]  [facility], [phone] - [phone]       -     [facility] in [employer], [address],                                  [phone], [facility] Child Abuse Hotline 7092762661 or 570-441-3905 (After Hours)   Behavioral Health Services  Substance Abuse Resources: - [facility]  864-211-1826 - [facility] 5707310567 - [facility] [place] [phone] Floydene Flock [phone] - Residential & Outpatient Substance Abuse Program  325-395-2599  Psychological Services: [facility]  [phone] Services  [phone] - [facility], [phone] [place]. [address] [place],  ACCESS LINE: 279-063-8909 or 314-032-6884, [email or web address]za  Dental Assistance  If unable to pay or uninsured, contact:  [facility] or [facility]. to become qualified for the adult dental clinic.  Patients with Medicaid: [facility] [phone] [address], [phone] [address] [phone]  If unable to pay, or uninsured, contact [facility] [phone]) or [facility] Department [phone] in [place], [phone] in [facility]) to become qualified for the adult dental clinic  Other Low-Cost Community Dental Services: - [facility]- [address] [place], [place], [place], [address], [phone], Ext. [phone], 2nd and 4th Thursday of the month at 6:30am.  10 clients each day by appointment, can sometimes see walk-in patients if someone does not show for an appointment. [facility]- [address] Ether Griffins [place], [place], [address], [phone] - [facility]- [address] [place], [place], [address], [phone] - [facility]- [phone] [facility] Department- [phone] [facility] Department- [phone]

## 2011-12-02 NOTE — ED Notes (Signed)
Patient is alert and oriented x4.  No complaints of pain.  Patient is driving herself home.  Discharge instructions explained no questions asked.  No further action taken.

## 2011-12-02 NOTE — ED Provider Notes (Signed)
History     CSN: [id number]  Arrival date & time 12/01/11  2156   First MD Initiated Contact with Patient 12/01/11 2348      Chief Complaint  Patient presents with  . Flank Pain    bilateral    (Consider location/radiation/quality/duration/timing/severity/associated sxs/prior treatment) HPI Complains of bilateral flank pain onset yesterday pain worse with holding her urine improved with urinating. No fever no abdominal pain no nausea or vomiting no other associated symptoms. She is presently asymptomatic without treatment. Last menstrual period 2 weeks ago, normal Past Medical History  Diagnosis Date  . Hypertension   . Diabetes mellitus     Past Surgical History  Procedure Date  . Cholecystectomy     No family history on file.  History  Substance Use Topics  . Smoking status: Never Smoker   . Smokeless tobacco: Never Used  . Alcohol Use: No    OB History    Grav Para Term Preterm Abortions TAB SAB Ect Mult Living                  Review of Systems  Constitutional: Negative.   HENT: Negative.   Respiratory: Negative.   Cardiovascular: Negative.   Gastrointestinal: Negative.   Genitourinary: Positive for flank pain.  Musculoskeletal: Negative.   Skin: Negative.   Neurological: Negative.   Hematological: Negative.   Psychiatric/Behavioral: Negative.   All other systems reviewed and are negative.    Allergies  Review of patient's allergies indicates no known allergies.  Home Medications   Current Outpatient Rx  Name Route Sig Dispense Refill  . ASPIRIN EC 81 MG PO TBEC Oral Take 81 mg by mouth daily.      . OMEGA-3 FATTY ACIDS 1000 MG PO CAPS Oral Take 1 g by mouth daily.    Marland Kitchen GLIPIZIDE ER 10 MG PO TB24 Oral Take 10 mg by mouth daily.    Marland Kitchen LISINOPRIL-HYDROCHLOROTHIAZIDE 10-12.5 MG PO TABS Oral Take 1 tablet by mouth daily.        BP 90/65  Pulse 69  Temp 98.6 F (37 C) (Oral)  Resp 18  Wt 220 lb (99.791 kg)  SpO2 100%  LMP  11/15/2011  Physical Exam  Nursing note and vitals reviewed. Constitutional: She appears well-developed and well-nourished.  HENT:  Head: Normocephalic and atraumatic.  Eyes: Conjunctivae are normal. Pupils are equal, round, and reactive to light.  Neck: Neck supple. No tracheal deviation present. No thyromegaly present.  Cardiovascular: Normal rate and regular rhythm.   No murmur heard. Pulmonary/Chest: Effort normal and breath sounds normal.  Abdominal: Soft. Bowel sounds are normal. She exhibits no distension. There is no tenderness.       Obese  Genitourinary:       No flank tenderness  Musculoskeletal: Normal range of motion. She exhibits no edema and no tenderness.  Neurological: She is alert. Coordination normal.  Skin: Skin is warm and dry. No rash noted.  Psychiatric: She has a normal mood and affect.    ED Course  Procedures (including critical care time) 2:15 AM remains asymptomatic Labs Reviewed  URINALYSIS, ROUTINE W REFLEX MICROSCOPIC - Abnormal; Notable for the following:    Specific Gravity, Urine 1.040 (*)     Glucose, UA >1000 (*)     All other components within normal limits  GLUCOSE, CAPILLARY - Abnormal; Notable for the following:    Glucose-Capillary 271 (*)     All other components within normal limits  URINE MICROSCOPIC-ADD ON - Abnormal; Notable  for the following:    Squamous Epithelial / LPF FEW (*)     All other components within normal limits  POCT PREGNANCY, URINE   No results found. Results for orders placed during the hospital encounter of 12/01/11  URINALYSIS, ROUTINE W REFLEX MICROSCOPIC      Component Value Range   Color, Urine YELLOW  YELLOW   APPearance CLEAR  CLEAR   Specific Gravity, Urine 1.040 (*) 1.005 - 1.030   pH 6.0  5.0 - 8.0   Glucose, UA >1000 (*) NEGATIVE mg/dL   Hgb urine dipstick NEGATIVE  NEGATIVE   Bilirubin Urine NEGATIVE  NEGATIVE   Ketones, ur NEGATIVE  NEGATIVE mg/dL   Protein, ur NEGATIVE  NEGATIVE mg/dL    Urobilinogen, UA 1.0  0.0 - 1.0 mg/dL   Nitrite NEGATIVE  NEGATIVE   Leukocytes, UA NEGATIVE  NEGATIVE  GLUCOSE, CAPILLARY      Component Value Range   Glucose-Capillary 271 (*) 70 - 99 mg/dL  POCT PREGNANCY, URINE      Component Value Range   Preg Test, Ur NEGATIVE  NEGATIVE  URINE MICROSCOPIC-ADD ON      Component Value Range   Squamous Epithelial / LPF FEW (*) RARE   WBC, UA 3-6  <3 WBC/hpf   Bacteria, UA RARE  RARE  POCT I-STAT, CHEM 8      Component Value Range   Sodium 136  135 - 145 mEq/L   Potassium 3.5  3.5 - 5.1 mEq/L   Chloride 99  96 - 112 mEq/L   BUN 9  6 - 23 mg/dL   Creatinine, Ser 1.47  0.50 - 1.10 mg/dL   Glucose, Bld 829 (*) 70 - 99 mg/dL   Calcium, Ion 5.62  1.30 - 1.32 mmol/L   TCO2 25  0 - 100 mmol/L   Hemoglobin 12.9  12.0 - 15.0 g/dL   HCT 86.5  78.4 - 69.6 %  POCT I-STAT TROPONIN I      Component Value Range   Troponin i, poc 0.00  0.00 - 0.08 ng/mL   Comment 3            No results found.   No diagnosis found.    MDM  Plan Tylenol for pain as needed. Patient requesting a new Chief Technology Officer. Will give resource guide Diagnosis #1 low back pain 2 hyperglycemia       Doug Sou, MD 12/02/11 0222

## 2011-12-02 NOTE — ED Notes (Signed)
Pt denies any pain at this time.  St's pain was in right flank and radiated to left.  Pt denies any pain at this time.  Skin warm and dry, color appropriate.  Dr. Ethelda Chick at bedside.

## 2011-12-27 ENCOUNTER — Emergency Department (HOSPITAL_COMMUNITY)
Admission: EM | Admit: 2011-12-27 | Discharge: 2011-12-27 | Disposition: A | Discharge status: Home / Self Care | Payer: Managed Care, Other (non HMO) | Attending: Emergency Medicine | Admitting: Emergency Medicine

## 2011-12-27 ENCOUNTER — Encounter (HOSPITAL_COMMUNITY): Payer: Self-pay | Admitting: Physical Medicine and Rehabilitation

## 2011-12-27 DIAGNOSIS — I10 Essential (primary) hypertension: Secondary | ICD-10-CM | POA: Insufficient documentation

## 2011-12-27 DIAGNOSIS — E119 Type 2 diabetes mellitus without complications: Secondary | ICD-10-CM | POA: Insufficient documentation

## 2011-12-27 DIAGNOSIS — M549 Dorsalgia, unspecified: Secondary | ICD-10-CM

## 2011-12-27 MED ORDER — TRAMADOL HCL 50 MG PO TABS: 50.0000 mg | ORAL_TABLET | Freq: Four times a day (QID) | ORAL | Status: AC | PRN

## 2011-12-27 MED ORDER — IBUPROFEN 800 MG PO TABS: 800.0000 mg | ORAL_TABLET | Freq: Three times a day (TID) | ORAL | Status: AC

## 2011-12-27 MED ORDER — DIAZEPAM 5 MG PO TABS: 5.0000 mg | ORAL_TABLET | Freq: Two times a day (BID) | ORAL | Status: AC

## 2011-12-27 NOTE — ED Notes (Signed)
Pt presents to department for evaluation of back injury. States she was lifting laundry basket when she pulled back out. States 6/10 pain that increases with movement/ambulation. No obvious deformities noted. She is alert and oriented x4.

## 2011-12-27 NOTE — ED Provider Notes (Signed)
History   This chart was scribed for Gerhard Munch, MD by Charolett Bumpers . The patient was seen in room TR06C/TR06C. Patient's care was started at 1740.    CSN: [id number]  Arrival date & time 12/27/11  1734   First MD Initiated Contact with Patient 12/27/11 1740      Chief Complaint  Patient presents with  . Back Pain    (Consider location/radiation/quality/duration/timing/severity/associated sxs/prior treatment) HPI Hayley Bryant is a 41 y.o. female who presents to the Emergency Department complaining of constant, moderate lower back pain after injuring her back around 12 noon. Pt described a shooting back across her back, after lifting a laundry basket earlier today. Pt rates her back pain 6/10. Pt denies any radiation of back to extremities or anywhere other than her back. Pt states that the back pain is sharp, shooting, located mostly on left side. Pt states that she took Ibuprofen with no relief. Pt states that her back pain is aggravated with movement/ambulating and states that her back pain is relieved with laying down. Pt denies any weakness, numbness or tingling of extremities. Pt denies any urinary or bowel incontinence. Pt denies any chest pain, SOB, fevers or chills. Pt denies any recent illnesses. Pt reports a h/o HTN and diabetes. Pt denies any h/o back surgeries.    PCP: Dr. Juluis Rainier  Past Medical History  Diagnosis Date  . Hypertension   . Diabetes mellitus     Past Surgical History  Procedure Date  . Cholecystectomy     No family history on file.  History  Substance Use Topics  . Smoking status: Never Smoker   . Smokeless tobacco: Never Used  . Alcohol Use: No    OB History    Grav Para Term Preterm Abortions TAB SAB Ect Mult Living                  Review of Systems  Constitutional:       Per HPI, otherwise negative  HENT:       Per HPI, otherwise negative  Eyes: Negative.   Respiratory:       Per HPI, otherwise negative    Cardiovascular:       Per HPI, otherwise negative  Gastrointestinal: Negative for vomiting.  Genitourinary: Negative.   Musculoskeletal: Positive for back pain.       Per HPI, otherwise negative  Skin: Negative.   Neurological: Negative for syncope.    Allergies  Review of patient's allergies indicates no known allergies.  Home Medications   Current Outpatient Rx  Name Route Sig Dispense Refill  . ASPIRIN EC 81 MG PO TBEC Oral Take 81 mg by mouth daily.      . ERGOCALCIFEROL 50000 UNITS PO CAPS Oral Take 50,000 Units by mouth once a week.    Marland Kitchen GLIPIZIDE ER 10 MG PO TB24 Oral Take 10 mg by mouth daily.    . IBUPROFEN 200 MG PO TABS Oral Take 800 mg by mouth every 6 (six) hours as needed. For pain    . LISINOPRIL-HYDROCHLOROTHIAZIDE 10-12.5 MG PO TABS Oral Take 1 tablet by mouth daily.      Marland Kitchen OVER THE COUNTER MEDICATION Both Eyes Place 1 drop into both eyes daily as needed. For itchy, watery eyes eye allergy drops      BP 136/89  Pulse 78  Temp 99.3 F (37.4 C) (Oral)  Resp 20  SpO2 100%  LMP 11/15/2011  Physical Exam  Nursing note and vitals reviewed.  Constitutional: She is oriented to person, place, and time. She appears well-developed and well-nourished. No distress.  HENT:  Head: Normocephalic and atraumatic.  Eyes: Conjunctivae and EOM are normal.  Cardiovascular: Normal rate, regular rhythm and normal heart sounds.   Pulmonary/Chest: Effort normal and breath sounds normal. No stridor. No respiratory distress. She has no wheezes.  Abdominal: She exhibits no distension.  Musculoskeletal: Normal range of motion. She exhibits no edema and no tenderness.       Abduction and extension of left hip normal. Pain with flexion on left hip. No spinal tenderness noted.   Neurological: She is alert and oriented to person, place, and time. No cranial nerve deficit.  Skin: Skin is warm and dry.  Psychiatric: She has a normal mood and affect.    ED Course  Procedures (including  critical care time)  DIAGNOSTIC STUDIES: Oxygen Saturation is 100% on room air, normal by my interpretation.    COORDINATION OF CARE:   17:58-Discussed planned course of treatment with the patient including pain medication, anti-inflammatories and f/u with PCP if no improvement, who is agreeable at this time.     Labs Reviewed - No data to display No results found.   No diagnosis found.    MDM  This previously well female presents following the acute onset of back pain while doing laundry.  On exam the patient is in no distress, with no neurovascular deficits, and the absence of any vital sign abnormalities reassuring for the low suspicion of a cauda equina or acute epidural hematoma.  The patient was discharged with analgesics, muscle relaxants, instructions to follow up with her primary care physician.  Gerhard Munch, MD 12/27/11 Rickey Primus

## 2012-03-21 ENCOUNTER — Emergency Department (HOSPITAL_COMMUNITY)
Admission: EM | Admit: 2012-03-21 | Discharge: 2012-03-22 | Disposition: A | Discharge status: Home / Self Care | Payer: Managed Care, Other (non HMO) | Attending: Emergency Medicine | Admitting: Emergency Medicine

## 2012-03-21 ENCOUNTER — Encounter (HOSPITAL_COMMUNITY): Payer: Self-pay | Admitting: *Deleted

## 2012-03-21 DIAGNOSIS — N949 Unspecified condition associated with female genital organs and menstrual cycle: Secondary | ICD-10-CM | POA: Insufficient documentation

## 2012-03-21 DIAGNOSIS — E119 Type 2 diabetes mellitus without complications: Secondary | ICD-10-CM | POA: Insufficient documentation

## 2012-03-21 DIAGNOSIS — N921 Excessive and frequent menstruation with irregular cycle: Secondary | ICD-10-CM | POA: Insufficient documentation

## 2012-03-21 DIAGNOSIS — I10 Essential (primary) hypertension: Secondary | ICD-10-CM | POA: Insufficient documentation

## 2012-03-21 DIAGNOSIS — Z794 Long term (current) use of insulin: Secondary | ICD-10-CM | POA: Insufficient documentation

## 2012-03-21 LAB — URINALYSIS, ROUTINE W REFLEX MICROSCOPIC
Glucose, UA: 500 mg/dL — AB
Leukocytes, UA: NEGATIVE
pH: 6.5 (ref 5.0–8.0)

## 2012-03-21 LAB — WET PREP, GENITAL
Trich, Wet Prep: NONE SEEN
Yeast Wet Prep HPF POC: NONE SEEN

## 2012-03-21 LAB — POCT PREGNANCY, URINE: Preg Test, Ur: NEGATIVE

## 2012-03-21 MED ORDER — NAPROXEN 250 MG PO TABS
500.0000 mg | ORAL_TABLET | Freq: Once | ORAL | Status: AC
  Administered 2012-03-21: 500 mg via ORAL
  Filled 2012-03-21: qty 2

## 2012-03-21 NOTE — ED Notes (Signed)
Pt c/o  Mid lower abd pain onset 10.19.13 and this am woke up with dark red vaginal spotting which has persisted all day when she wipes.  Pain has gone now but does have some abd  Cramping.  Denies urinary symptoms.  Denies nauea or vomiting.  LMP 10.15.13

## 2012-03-21 NOTE — ED Notes (Signed)
Pt states that she is on BC pill and does not usually have spotting. Pt was spotting last night, but just when she wiped. Pt states lower pelvic pain as well last night. Pt states itching no discharge. Pt denies burning upon urination.

## 2012-03-22 MED ORDER — NAPROXEN 500 MG PO TABS: 500.0000 mg | ORAL_TABLET | Freq: Two times a day (BID) | ORAL | Status: DC

## 2012-03-22 NOTE — ED Provider Notes (Signed)
History     CSN: [id number]  Arrival date & time 03/21/12  2210   First MD Initiated Contact with Patient 03/21/12 2259      Chief Complaint  Patient presents with  . Vaginal Bleeding  . Pelvic Pain    (Consider location/radiation/quality/duration/timing/severity/associated sxs/prior treatment) Patient is a 41 y.o. female presenting with vaginal bleeding and pelvic pain.  Vaginal Bleeding  Pelvic Pain   41 year old female presents to emergency room complaining of vaginal bleeding and lower pelvic cramping. Patient reports she is on a birth control pill. She had one day where she was late taking a pill but has not missed any pills. She has not previously had breakthrough beat bleeding. Patient had spotting yesterday, and then today has had a little heavier bleeding. She is not due to have a period for another 2 weeks. No vaginal discharge no urinary symptoms no fever no chills no new sexual partner. Patient is concerned that she might be pregnant.  Past Medical History  Diagnosis Date  . Hypertension   . Diabetes mellitus     Past Surgical History  Procedure Date  . Cholecystectomy     History reviewed. No pertinent family history.  History  Substance Use Topics  . Smoking status: Never Smoker   . Smokeless tobacco: Never Used  . Alcohol Use: No    OB History    Grav Para Term Preterm Abortions TAB SAB Ect Mult Living                  Review of Systems  Genitourinary: Positive for vaginal bleeding and pelvic pain.  All other systems reviewed and are negative.    Allergies  Review of patient's allergies indicates no known allergies.  Home Medications   Current Outpatient Rx  Name Route Sig Dispense Refill  . ASPIRIN EC 81 MG PO TBEC Oral Take 81 mg by mouth daily.      . INSULIN ASPART 100 UNIT/ML [place] SOLN Subcutaneous Inject 18 Units into the skin 2 (two) times daily.    Marland Kitchen LISINOPRIL-HYDROCHLOROTHIAZIDE 10-12.5 MG PO TABS Oral Take 1 tablet by mouth  daily.      Marland Kitchen NORGESTIMATE-ETH ESTRADIOL 0.25-35 MG-MCG PO TABS Oral Take 1 tablet by mouth daily.    Marland Kitchen NAPROXEN 500 MG PO TABS Oral Take 1 tablet (500 mg total) by mouth 2 (two) times daily with a meal. 30 tablet 0    BP 97/62  Pulse 81  Temp 98.5 F (36.9 C) (Oral)  Resp 16  SpO2 98%  LMP 03/06/2012  Physical Exam  Nursing note and vitals reviewed. Constitutional: She appears well-developed and well-nourished. No distress.  Abdominal: Soft. Bowel sounds are normal. She exhibits no distension and no mass. There is no tenderness. There is no rebound and no guarding.  Genitourinary: Vaginal discharge (dark blood noted in vaginal vault without frank discharge) found.       normal external genitalia, vulva, vagina, cervix, uterus and adnexa. No adnexal tenderness mild uterine tenderness, no cervical motion tenderness   Skin: Skin is warm and dry. No rash noted. She is not diaphoretic. No erythema. No pallor.    ED Course  Procedures (including critical care time)  Labs Reviewed  URINALYSIS, ROUTINE W REFLEX MICROSCOPIC - Abnormal; Notable for the following:    Specific Gravity, Urine 1.035 (*)     Glucose, UA 500 (*)     All other components within normal limits  WET PREP, GENITAL - Abnormal; Notable for the following:  Clue Cells Wet Prep HPF POC FEW (*)     All other components within normal limits  POCT PREGNANCY, URINE  GC/CHLAMYDIA PROBE AMP, GENITAL   No results found.   1. Breakthrough bleeding on birth control pills       MDM  41 year old female with scant vaginal bleeding suspected breakthrough bleeding. Patient is not pregnant. No signs of PID. Will have her followup with her gynecologist.        Olivia Mackie, MD 03/22/12 [phone]

## 2012-05-16 ENCOUNTER — Encounter (HOSPITAL_COMMUNITY): Payer: Self-pay | Admitting: *Deleted

## 2012-05-16 ENCOUNTER — Emergency Department (HOSPITAL_COMMUNITY)
Admission: EM | Admit: 2012-05-16 | Discharge: 2012-05-16 | Disposition: A | Discharge status: Home / Self Care | Payer: Managed Care, Other (non HMO) | Attending: Emergency Medicine | Admitting: Emergency Medicine

## 2012-05-16 DIAGNOSIS — Z7982 Long term (current) use of aspirin: Secondary | ICD-10-CM | POA: Insufficient documentation

## 2012-05-16 DIAGNOSIS — N63 Unspecified lump in unspecified breast: Secondary | ICD-10-CM | POA: Insufficient documentation

## 2012-05-16 DIAGNOSIS — I10 Essential (primary) hypertension: Secondary | ICD-10-CM | POA: Insufficient documentation

## 2012-05-16 DIAGNOSIS — E119 Type 2 diabetes mellitus without complications: Secondary | ICD-10-CM | POA: Insufficient documentation

## 2012-05-16 DIAGNOSIS — N6310 Unspecified lump in the right breast, unspecified quadrant: Secondary | ICD-10-CM

## 2012-05-16 DIAGNOSIS — Z794 Long term (current) use of insulin: Secondary | ICD-10-CM | POA: Insufficient documentation

## 2012-05-16 DIAGNOSIS — Z79899 Other long term (current) drug therapy: Secondary | ICD-10-CM | POA: Insufficient documentation

## 2012-05-16 NOTE — ED Notes (Signed)
Pt states when she awoke this am, she noticed a "knot" to outer right breast.

## 2012-05-16 NOTE — ED Provider Notes (Signed)
Medical screening examination/treatment/procedure(s) were performed by non-physician practitioner and as supervising physician I was immediately available for consultation/collaboration.  Ethelda Chick, MD 05/16/12 [phone]

## 2012-05-16 NOTE — ED Provider Notes (Signed)
History     CSN: [id number]  Arrival date & time 05/16/12  0814   First MD Initiated Contact with Patient 05/16/12 0915      Chief Complaint  Patient presents with  . Breast Mass    lump to right breast    (Consider location/radiation/quality/duration/timing/severity/associated sxs/prior treatment) HPI  41 year old female with history of hypertension and history of diabetes presents for evaluations of breast mass.  Patient reports she was awoke this a.m. and noticed a "knot" to the outer right breast.  States she was playing on the computer and for no apparent reasons she touches her right breast and noticed a lump. She denies any specific pain but states she didn't evaluate further. She has not noticed this knot before.  Her last menstrual period was 3 weeks ago. Patient denies fever, weight changes, myalgias, nipple discharge, bloody nipple discharge, chest pain, shortness of breath, or pain in the axillary area no significant family history of breast cancer. Patient is a nonsmoker.  Past Medical History  Diagnosis Date  . Hypertension   . Diabetes mellitus     Past Surgical History  Procedure Date  . Cholecystectomy     No family history on file.  History  Substance Use Topics  . Smoking status: Never Smoker   . Smokeless tobacco: Never Used  . Alcohol Use: No    OB History    Grav Para Term Preterm Abortions TAB SAB Ect Mult Living                  Review of Systems  Constitutional: Negative for fever, chills, diaphoresis and unexpected weight change.  Respiratory: Negative for shortness of breath.   Cardiovascular: Negative for chest pain.  Skin: Negative for rash and wound.    Allergies  Review of patient's allergies indicates no known allergies.  Home Medications   Current Outpatient Rx  Name  Route  Sig  Dispense  Refill  . ASPIRIN EC 81 MG PO TBEC   Oral   Take 81 mg by mouth daily.           Marland Kitchen CINNAMON PO   Oral   Take 2 capsules by mouth  daily.         . INSULIN ASPART PROT & ASPART (70-30) 100 UNIT/ML [place] SUSP   Subcutaneous   Inject 18 Units into the skin 2 (two) times daily with a meal.         . LISINOPRIL-HYDROCHLOROTHIAZIDE 20-25 MG PO TABS   Oral   Take 1 tablet by mouth daily.         Marland Kitchen NAPROXEN 500 MG PO TABS   Oral   Take 500 mg by mouth 2 (two) times daily as needed. For pain         . NORETHINDRONE 0.35 MG PO TABS   Oral   Take 1 tablet by mouth daily.           BP 106/65  Pulse 72  Temp 98.5 F (36.9 C) (Oral)  Resp 16  SpO2 100%  LMP 04/26/2012  Physical Exam  Nursing note and vitals reviewed. Constitutional: She is oriented to person, place, and time. She appears well-developed and well-nourished. No distress.  HENT:  Head: Atraumatic.  Eyes: Conjunctivae normal are normal.  Neck: Neck supple.  Cardiovascular: Normal rate and regular rhythm.   Pulmonary/Chest: No respiratory distress. She exhibits no tenderness. Right breast exhibits tenderness. Right breast exhibits no inverted nipple, no nipple discharge and no skin  change.       Chaperone present  R breast was examine.  Multiple nodular lesion noted below breast tissue at 2 o'clock medial to areolar and a more prominent nodular, non fixated lesion noted at 9 o'clock, tender, lateral to areola.    No nipple invertion, discharge, skin changes, peau d'orange or rash.    Abdominal: Soft. There is no tenderness.  Neurological: She is alert and oriented to person, place, and time.  Skin: Skin is warm. No rash noted.    ED Course  Procedures (including critical care time)  Labs Reviewed - No data to display No results found.   No diagnosis found.  1. Breast mass, right  MDM  Pt with several "knots" to R breast suggestive of cystic lesions.  No red flags.  No evidence of infection or abscess.  Pt report having normal mammogram < 6 months ago.  I recommend having self breast examination monthly while in the shower.   Although risk of cancer is low, i do recommend having mammography to evaluate further.  Resource given.  Pt does have PCP which she agrees to f/u with.    BP 106/65  Pulse 72  Temp 98.5 F (36.9 C) (Oral)  Resp 16  SpO2 100%  LMP 04/26/2012       Fayrene Helper, PA-C 05/16/12 [phone]

## 2012-07-08 ENCOUNTER — Encounter (HOSPITAL_COMMUNITY): Payer: Self-pay

## 2012-07-08 ENCOUNTER — Emergency Department (HOSPITAL_COMMUNITY)
Admission: EM | Admit: 2012-07-08 | Discharge: 2012-07-08 | Disposition: A | Discharge status: Home / Self Care | Payer: Managed Care, Other (non HMO) | Attending: Emergency Medicine | Admitting: Emergency Medicine

## 2012-07-08 DIAGNOSIS — I10 Essential (primary) hypertension: Secondary | ICD-10-CM | POA: Insufficient documentation

## 2012-07-08 DIAGNOSIS — R000 Tachycardia, unspecified: Secondary | ICD-10-CM | POA: Insufficient documentation

## 2012-07-08 DIAGNOSIS — Z7982 Long term (current) use of aspirin: Secondary | ICD-10-CM | POA: Insufficient documentation

## 2012-07-08 DIAGNOSIS — F430 Acute stress reaction: Secondary | ICD-10-CM | POA: Insufficient documentation

## 2012-07-08 DIAGNOSIS — E119 Type 2 diabetes mellitus without complications: Secondary | ICD-10-CM | POA: Insufficient documentation

## 2012-07-08 DIAGNOSIS — Z794 Long term (current) use of insulin: Secondary | ICD-10-CM | POA: Insufficient documentation

## 2012-07-08 DIAGNOSIS — G4700 Insomnia, unspecified: Secondary | ICD-10-CM | POA: Insufficient documentation

## 2012-07-08 DIAGNOSIS — F411 Generalized anxiety disorder: Secondary | ICD-10-CM | POA: Insufficient documentation

## 2012-07-08 DIAGNOSIS — Z79899 Other long term (current) drug therapy: Secondary | ICD-10-CM | POA: Insufficient documentation

## 2012-07-08 DIAGNOSIS — F439 Reaction to severe stress, unspecified: Secondary | ICD-10-CM

## 2012-07-08 DIAGNOSIS — F419 Anxiety disorder, unspecified: Secondary | ICD-10-CM

## 2012-07-08 MED ORDER — LORAZEPAM 1 MG PO TABS
1.0000 mg | ORAL_TABLET | Freq: Once | ORAL | Status: AC
  Administered 2012-07-08: 1 mg via ORAL
  Filled 2012-07-08: qty 1

## 2012-07-08 MED ORDER — CLONAZEPAM 0.5 MG PO TABS: 0.5000 mg | ORAL_TABLET | Freq: Two times a day (BID) | ORAL | Status: DC | PRN

## 2012-07-08 NOTE — ED Provider Notes (Signed)
History     CSN: [id number]  Arrival date & time 07/08/12  0523   First MD Initiated Contact with Patient 07/08/12 0600      Chief Complaint  Patient presents with  . Anxiety    (Consider location/radiation/quality/duration/timing/severity/associated sxs/prior treatment) HPI Hayley Bryant is a 42 y.o. female presents to the hospital after feeling anxious while waking up this morning. Patient woke up with feelings of anxiety and crying, she says she's been having insomnia, she is having difficulty sleeping and only sleeping about 3 hours a night. Patient says she has lots of stress in her life right now, per mother is living with her, she says her family and husband have stressors as well. She denies any suicidal homicidal ideations, she denies any history of psychiatric illness or prior history of anxiety or depression.  Patient denies any delusions or auditory or visual hallucinations.  Patient's primary care physician is Dr. Zachery Dauer, she has not talked to Dr. Zachery Dauer about these symptoms yet. The symptoms have been present for the last few weeks and this morning just got worse. She has not taken anything for them there are no other alleviating or exacerbating factors no other associated symptoms. Patient says her heart was racing when she woke up but denies any skipping beats or other palpitations, denies any chest pain, shortness of breath, abdominal pain, nausea vomiting or diarrhea, headaches, myalgias, joint pains or rash.   Past Medical History  Diagnosis Date  . Hypertension   . Diabetes mellitus     Past Surgical History  Procedure Date  . Cholecystectomy     No family history on file.  History  Substance Use Topics  . Smoking status: Never Smoker   . Smokeless tobacco: Never Used  . Alcohol Use: No    OB History    Grav Para Term Preterm Abortions TAB SAB Ect Mult Living                  Review of Systems At least 10pt or greater review of systems completed and  are negative except where specified in the HPI.  Allergies  Review of patient's allergies indicates no known allergies.  Home Medications   Current Outpatient Rx  Name  Route  Sig  Dispense  Refill  . ASPIRIN EC 81 MG PO TBEC   Oral   Take 81 mg by mouth daily.           Marland Kitchen CINNAMON PO   Oral   Take 2 capsules by mouth daily.         . INSULIN ASPART PROT & ASPART (70-30) 100 UNIT/ML [place] SUSP   Subcutaneous   Inject 18 Units into the skin 2 (two) times daily with a meal.         . LISINOPRIL-HYDROCHLOROTHIAZIDE 20-25 MG PO TABS   Oral   Take 1 tablet by mouth daily.         . NORETHINDRONE 0.35 MG PO TABS   Oral   Take 1 tablet by mouth daily.           BP 128/75  Pulse 74  Temp 98.4 F (36.9 C)  Resp 24  SpO2 100%  LMP 06/16/2012  Physical Exam  Nursing notes reviewed.  Electronic medical record reviewed. VITAL SIGNS:   Filed Vitals:   07/08/12 0532  BP: 128/75  Pulse: 74  Temp: 98.4 F (36.9 C)  Resp: 24  SpO2: 100%   CONSTITUTIONAL: Awake, oriented, appears non-toxic HENT:  Atraumatic, normocephalic, oral mucosa pink and moist, airway patent. Nares patent without drainage. External ears normal. EYES: Conjunctiva clear, EOMI, PERRLA NECK: Trachea midline, non-tender, supple CARDIOVASCULAR: Normal heart rate, Normal rhythm, No murmurs, rubs, gallops PULMONARY/CHEST: Clear to auscultation, no rhonchi, wheezes, or rales. Symmetrical breath sounds. Non-tender. ABDOMINAL: Non-distended, soft, non-tender - no rebound or guarding.  BS normal. NEUROLOGIC: Non-focal, moving all four extremities, no gross sensory or motor deficits. EXTREMITIES: No clubbing, cyanosis, or edema SKIN: Warm, Dry, No erythema, No rash Psychiatric: Denies any SI or HI, mood is congruent with affect, mood is slightly depressed. Not responding to internal stimuli, responds appropriately to questions. Good insight.  ED Course  Procedures (including critical care time)  Date:  07/08/2012  Rate: 68  Rhythm: normal sinus rhythm  QRS Axis: normal  Intervals: First degree AV block  ST/T Wave abnormalities: normal  Conduction Disutrbances: none  Narrative Interpretation: First degree AV block, no prior for comparison  Labs Reviewed - No data to display No results found.   1. Anxiety   2. Stress at home   3. Insomnia secondary to anxiety       MDM  EVALIN SHAWHAN is a 42 y.o. female presenting with anxiety, rapid heartbeat starting this morning, she got a history of anxiety or depression however does appear to be having a stress reaction possibly a adjustment disorder. Patient does have a primary care physician-Dr. Zachery Dauer, patient's also having problem with insomnia. Give the patient is small prescription for clonazepam for anxiety and for sleep, have her followup in one to 2 days with Dr. Zachery Dauer. Spent extensive time at the bedside splint use and risks of clonazepam he can followup with her primary care provider for long-term care. Also discussed with ACT team patient will be provided with community resources for counseling.  Patient is feeling better, she understands accepts medical plan is as been dictated and only discharged home stable and in good condition.       Jones Skene, MD 07/08/12 4098

## 2012-07-08 NOTE — ED Notes (Signed)
Pt to ed from home.  Woke up this am unable to stop crying, anxious feeling,  And heart racing. States she does have a major stressor in life right now.  Feels like her heart has stopped racing now but still feels anxious.  Pt tearful upon interview.

## 2012-07-08 NOTE — ED Notes (Signed)
Report given to nikki, rn

## 2012-07-13 DIAGNOSIS — F419 Anxiety disorder, unspecified: Secondary | ICD-10-CM | POA: Insufficient documentation

## 2012-07-13 DIAGNOSIS — F32A Depression, unspecified: Secondary | ICD-10-CM | POA: Diagnosis present

## 2012-09-02 ENCOUNTER — Encounter (HOSPITAL_COMMUNITY): Payer: Self-pay | Admitting: Emergency Medicine

## 2012-09-02 ENCOUNTER — Emergency Department (HOSPITAL_COMMUNITY)
Admission: EM | Admit: 2012-09-02 | Discharge: 2012-09-02 | Disposition: A | Discharge status: Home / Self Care | Payer: Managed Care, Other (non HMO) | Attending: Emergency Medicine | Admitting: Emergency Medicine

## 2012-09-02 DIAGNOSIS — Z79899 Other long term (current) drug therapy: Secondary | ICD-10-CM | POA: Insufficient documentation

## 2012-09-02 DIAGNOSIS — A084 Viral intestinal infection, unspecified: Secondary | ICD-10-CM

## 2012-09-02 DIAGNOSIS — I10 Essential (primary) hypertension: Secondary | ICD-10-CM | POA: Insufficient documentation

## 2012-09-02 DIAGNOSIS — Z794 Long term (current) use of insulin: Secondary | ICD-10-CM | POA: Insufficient documentation

## 2012-09-02 DIAGNOSIS — R112 Nausea with vomiting, unspecified: Secondary | ICD-10-CM | POA: Insufficient documentation

## 2012-09-02 DIAGNOSIS — R630 Anorexia: Secondary | ICD-10-CM | POA: Insufficient documentation

## 2012-09-02 DIAGNOSIS — E119 Type 2 diabetes mellitus without complications: Secondary | ICD-10-CM | POA: Insufficient documentation

## 2012-09-02 DIAGNOSIS — R1013 Epigastric pain: Secondary | ICD-10-CM | POA: Insufficient documentation

## 2012-09-02 DIAGNOSIS — A088 Other specified intestinal infections: Secondary | ICD-10-CM | POA: Insufficient documentation

## 2012-09-02 DIAGNOSIS — Z9089 Acquired absence of other organs: Secondary | ICD-10-CM | POA: Insufficient documentation

## 2012-09-02 DIAGNOSIS — Z7982 Long term (current) use of aspirin: Secondary | ICD-10-CM | POA: Insufficient documentation

## 2012-09-02 DIAGNOSIS — R5381 Other malaise: Secondary | ICD-10-CM | POA: Insufficient documentation

## 2012-09-02 LAB — GLUCOSE, CAPILLARY: Glucose-Capillary: 123 mg/dL — ABNORMAL HIGH (ref 70–99)

## 2012-09-02 LAB — COMPREHENSIVE METABOLIC PANEL
ALT: 15 U/L (ref 0–35)
AST: 19 U/L (ref 0–37)
Albumin: 3.9 g/dL (ref 3.5–5.2)
Calcium: 8.9 mg/dL (ref 8.4–10.5)
Sodium: 135 mEq/L (ref 135–145)
Total Protein: 7.2 g/dL (ref 6.0–8.3)

## 2012-09-02 LAB — CBC
HCT: 36.1 % (ref 36.0–46.0)
MCHC: 34.3 g/dL (ref 30.0–36.0)
MCV: 87.6 fL (ref 78.0–100.0)
RDW: 13.5 % (ref 11.5–15.5)

## 2012-09-02 LAB — POCT PREGNANCY, URINE: Preg Test, Ur: NEGATIVE

## 2012-09-02 MED ORDER — ONDANSETRON HCL 4 MG/2ML IJ SOLN
4.0000 mg | Freq: Once | INTRAMUSCULAR | Status: AC
  Administered 2012-09-02: 4 mg via INTRAVENOUS
  Filled 2012-09-02: qty 2

## 2012-09-02 MED ORDER — ONDANSETRON HCL 4 MG PO TABS: 4.0000 mg | ORAL_TABLET | Freq: Once | ORAL | Status: DC

## 2012-09-02 MED ORDER — SODIUM CHLORIDE 0.9 % IV BOLUS (SEPSIS)
1000.0000 mL | Freq: Once | INTRAVENOUS | Status: AC
  Administered 2012-09-02: 1000 mL via INTRAVENOUS

## 2012-09-02 MED ORDER — ONDANSETRON HCL 8 MG PO TABS
4.0000 mg | ORAL_TABLET | Freq: Once | ORAL | Status: AC
  Administered 2012-09-02: 4 mg via ORAL
  Filled 2012-09-02: qty 2

## 2012-09-02 NOTE — ED Provider Notes (Signed)
History     CSN: [id number]  Arrival date & time 09/02/12  1478   First MD Initiated Contact with Patient 09/02/12 [phone]      No chief complaint on file.   (Consider location/radiation/quality/duration/timing/severity/associated sxs/prior treatment) HPI Comments: 42 y/o female with a PMHx of HTN and DM presents to the ED complaining of gradual onset nausea and vomiting x 2 days. Patient states after eating lunch at work yesterday (microwavable meal with chicken) around noon she began to feel nauseated, went out to dinner later with co workers and could not eat due to decreased appetite. When she got home later last night she began to vomit and states "stopped counting after 5 episodes". Admits to associated mid-epigastric and peri-umbilical abdominal pain described as discomfort after vomiting. She is beginning to feel weak from vomiting. Denies fever, chills, diarrhea. Unsure of sick contacts but thinks her niece may have been sick with similar symptoms. History of cholecystectomy. Denies urinary or vaginal complaints.   The history is provided by the patient.    Past Medical History  Diagnosis Date  . Hypertension   . Diabetes mellitus     Past Surgical History  Procedure Laterality Date  . Cholecystectomy      No family history on file.  History  Substance Use Topics  . Smoking status: Never Smoker   . Smokeless tobacco: Never Used  . Alcohol Use: No    OB History   Grav Para Term Preterm Abortions TAB SAB Ect Mult Living                  Review of Systems  Constitutional: Positive for appetite change. Negative for fever and chills.  Respiratory: Negative for shortness of breath.   Cardiovascular: Negative for chest pain.  Gastrointestinal: Positive for nausea, vomiting and abdominal pain. Negative for diarrhea.  Neurological: Positive for weakness.  All other systems reviewed and are negative.    Allergies  Review of patient's allergies indicates no known  allergies.  Home Medications   Current Outpatient Rx  Name  Route  Sig  Dispense  Refill  . aspirin EC 81 MG tablet   Oral   Take 81 mg by mouth daily.           Marland Kitchen CINNAMON PO   Oral   Take 2 capsules by mouth daily.         . clonazePAM (KLONOPIN) 0.5 MG tablet   Oral   Take 1 tablet (0.5 mg total) by mouth 2 (two) times daily as needed for anxiety (You may take 2 tablets (1mg ) at night to help with sleep.).   15 tablet   0   . insulin aspart protamine-insulin aspart (NOVOLOG 70/30) (70-30) 100 UNIT/ML injection   Subcutaneous   Inject 18 Units into the skin 2 (two) times daily with a meal.         . lisinopril-hydrochlorothiazide (PRINZIDE,ZESTORETIC) 20-25 MG per tablet   Oral   Take 1 tablet by mouth daily.         . norethindrone (MICRONOR,CAMILA,ERRIN) 0.35 MG tablet   Oral   Take 1 tablet by mouth daily.           BP 133/82  Pulse 114  Resp 20  SpO2 98%  Physical Exam  Nursing note and vitals reviewed. Constitutional: She is oriented to person, place, and time. She appears well-developed and well-nourished. No distress.  Appears fatigued.  HENT:  Head: Normocephalic and atraumatic.  Mouth/Throat: Oropharynx is clear  and moist and mucous membranes are normal.  Eyes: Conjunctivae and EOM are normal. Pupils are equal, round, and reactive to light.  Neck: Normal range of motion. Neck supple.  Cardiovascular: Normal rate, regular rhythm, normal heart sounds and intact distal pulses.   Pulmonary/Chest: Effort normal and breath sounds normal. No respiratory distress.  Abdominal: Soft. Normal appearance and bowel sounds are normal. She exhibits no distension and no mass. There is tenderness (mild) in the epigastric area. There is no rigidity, no rebound and no guarding.  Musculoskeletal: Normal range of motion. She exhibits no edema.  Neurological: She is alert and oriented to person, place, and time.  Skin: Skin is warm and dry. No pallor.  Psychiatric:  She has a normal mood and affect. Her behavior is normal.    ED Course  Procedures (including critical care time)  Labs Reviewed  CBC  COMPREHENSIVE METABOLIC PANEL  LIPASE, BLOOD   No results found.   1. Viral gastroenteritis   2. Nausea and vomiting       MDM  42 y/o female with nausea and vomiting. No fever, chills, diarrhea. Labs normal. Patient is diabetic- glucose 134. She is feeling better after receiving fluids and zofran. Abdomen no longer tender to palpation. Diagnosis most likely viral gastroenteritis. Will given PO challenge and re-assess.  8:26 AM Patient able to tolerate PO challenge with ginger ale and crackers. Abdomen still soft and non-tender. Stable for discharge. Return precautions discussed. Patient states understanding of plan and is agreeable.       Trevor Mace, PA-C 09/02/12 [phone]

## 2012-09-02 NOTE — ED Notes (Signed)
Pt from home c/o N/V. Pt states nausea since noon yesterday, emesis started at 10pm last night, unable to recall amount since. No signs of distress at this time

## 2012-09-08 NOTE — ED Provider Notes (Signed)
Medical screening examination/treatment/procedure(s) were performed by non-physician practitioner and as supervising physician I was immediately available for consultation/collaboration.   Suzi Roots, MD 09/08/12 2126

## 2012-10-14 ENCOUNTER — Emergency Department (HOSPITAL_COMMUNITY)
Admission: EM | Admit: 2012-10-14 | Discharge: 2012-10-15 | Disposition: A | Discharge status: Home / Self Care | Payer: Managed Care, Other (non HMO) | Attending: Emergency Medicine | Admitting: Emergency Medicine

## 2012-10-14 ENCOUNTER — Encounter (HOSPITAL_COMMUNITY): Payer: Self-pay | Admitting: Emergency Medicine

## 2012-10-14 DIAGNOSIS — Z794 Long term (current) use of insulin: Secondary | ICD-10-CM | POA: Insufficient documentation

## 2012-10-14 DIAGNOSIS — Z7982 Long term (current) use of aspirin: Secondary | ICD-10-CM | POA: Insufficient documentation

## 2012-10-14 DIAGNOSIS — H81399 Other peripheral vertigo, unspecified ear: Secondary | ICD-10-CM | POA: Insufficient documentation

## 2012-10-14 DIAGNOSIS — R110 Nausea: Secondary | ICD-10-CM | POA: Insufficient documentation

## 2012-10-14 DIAGNOSIS — E119 Type 2 diabetes mellitus without complications: Secondary | ICD-10-CM | POA: Insufficient documentation

## 2012-10-14 DIAGNOSIS — I10 Essential (primary) hypertension: Secondary | ICD-10-CM | POA: Insufficient documentation

## 2012-10-14 DIAGNOSIS — Z79899 Other long term (current) drug therapy: Secondary | ICD-10-CM | POA: Insufficient documentation

## 2012-10-14 DIAGNOSIS — H9319 Tinnitus, unspecified ear: Secondary | ICD-10-CM | POA: Insufficient documentation

## 2012-10-14 DIAGNOSIS — Z3202 Encounter for pregnancy test, result negative: Secondary | ICD-10-CM | POA: Insufficient documentation

## 2012-10-14 LAB — URINALYSIS, ROUTINE W REFLEX MICROSCOPIC
Ketones, ur: NEGATIVE mg/dL
Leukocytes, UA: NEGATIVE
Nitrite: NEGATIVE
Specific Gravity, Urine: 1.02 (ref 1.005–1.030)
Urobilinogen, UA: 1 mg/dL (ref 0.0–1.0)
pH: 6 (ref 5.0–8.0)

## 2012-10-14 LAB — POCT PREGNANCY, URINE: Preg Test, Ur: NEGATIVE

## 2012-10-14 LAB — CBC WITH DIFFERENTIAL/PLATELET
Basophils Relative: 0 % (ref 0–1)
Eosinophils Absolute: 0 10*3/uL (ref 0.0–0.7)
Hemoglobin: 12.4 g/dL (ref 12.0–15.0)
MCHC: 34 g/dL (ref 30.0–36.0)
Monocytes Relative: 5 % (ref 3–12)
Neutro Abs: 5.5 10*3/uL (ref 1.7–7.7)
Neutrophils Relative %: 70 % (ref 43–77)
Platelets: 320 10*3/uL (ref 150–400)
RBC: 4.07 MIL/uL (ref 3.87–5.11)

## 2012-10-14 LAB — URINE MICROSCOPIC-ADD ON

## 2012-10-14 LAB — COMPREHENSIVE METABOLIC PANEL
ALT: 15 U/L (ref 0–35)
AST: 18 U/L (ref 0–37)
Albumin: 4.1 g/dL (ref 3.5–5.2)
Alkaline Phosphatase: 52 U/L (ref 39–117)
BUN: 11 mg/dL (ref 6–23)
Chloride: 100 mEq/L (ref 96–112)
Potassium: 3.7 mEq/L (ref 3.5–5.1)
Sodium: 137 mEq/L (ref 135–145)
Total Bilirubin: 0.4 mg/dL (ref 0.3–1.2)
Total Protein: 7.5 g/dL (ref 6.0–8.3)

## 2012-10-14 MED ORDER — MECLIZINE HCL 25 MG PO TABS
25.0000 mg | ORAL_TABLET | Freq: Once | ORAL | Status: AC
  Administered 2012-10-14: 25 mg via ORAL
  Filled 2012-10-14: qty 1

## 2012-10-14 NOTE — ED Provider Notes (Signed)
History     CSN: [id number]  Arrival date & time 10/14/12  2100   First MD Initiated Contact with Patient 10/14/12 2303      Chief Complaint  Patient presents with  . Dizziness    (Consider location/radiation/quality/duration/timing/severity/associated sxs/prior treatment) HPI Pt with episodic dizziness starting yesterday while walking. Associated with nausea. Worse when turning heaad. Pt c/o ringing in R ear. No viral symptoms. No fever chills. No focal weakness. No abd pain. Pt feeling better in ED.  Past Medical History  Diagnosis Date  . Hypertension   . Diabetes mellitus     Past Surgical History  Procedure Laterality Date  . Cholecystectomy      No family history on file.  History  Substance Use Topics  . Smoking status: Never Smoker   . Smokeless tobacco: Never Used  . Alcohol Use: No    OB History   Grav Para Term Preterm Abortions TAB SAB Ect Mult Living                  Review of Systems  Constitutional: Negative for fever and chills.  HENT: Positive for tinnitus. Negative for ear pain, sore throat, neck pain, neck stiffness and sinus pressure.   Respiratory: Negative for shortness of breath.   Cardiovascular: Negative for chest pain.  Gastrointestinal: Positive for nausea. Negative for vomiting, abdominal pain, diarrhea and constipation.  Musculoskeletal: Negative for myalgias and back pain.  Skin: Negative for pallor, rash and wound.  Neurological: Positive for dizziness and light-headedness. Negative for tremors, seizures, syncope, weakness, numbness and headaches.    Allergies  Review of patient's allergies indicates no known allergies.  Home Medications   Current Outpatient Rx  Name  Route  Sig  Dispense  Refill  . Acetaminophen-Caff-Pyrilamine (MIDOL COMPLETE PO)   Oral   Take 1 tablet by mouth 2 (two) times daily as needed (for pain).         Marland Kitchen aspirin EC 81 MG tablet   Oral   Take 81 mg by mouth daily.           . Cinnamon 500  MG capsule   Oral   Take 1,000 mg by mouth daily.         Marland Kitchen ibuprofen (ADVIL,MOTRIN) 200 MG tablet   Oral   Take 800 mg by mouth 2 (two) times daily as needed for pain.         Marland Kitchen insulin aspart protamine-insulin aspart (NOVOLOG 70/30) (70-30) 100 UNIT/ML injection   Subcutaneous   Inject 15-18 Units into the skin 2 (two) times daily with a meal. 15 units in the morning and 18 units in the evening         . lisinopril-hydrochlorothiazide (PRINZIDE,ZESTORETIC) 20-25 MG per tablet   Oral   Take 1 tablet by mouth daily.         . norethindrone (MICRONOR,CAMILA,ERRIN) 0.35 MG tablet   Oral   Take 1 tablet by mouth daily.         . Venlafaxine HCl (EFFEXOR PO)   Oral   Take 1 tablet by mouth daily.         . meclizine (ANTIVERT) 25 MG tablet   Oral   Take 1 tablet (25 mg total) by mouth 3 (three) times daily as needed for dizziness.   30 tablet   0   . ondansetron (ZOFRAN ODT) 4 MG disintegrating tablet      4mg  ODT q4 hours prn nausea/vomit   4 tablet  0     BP 112/73  Pulse 87  Temp(Src) 97.8 F (36.6 C) (Oral)  Resp 14  SpO2 99%  LMP 10/11/2012  Physical Exam  Nursing note and vitals reviewed. Constitutional: She is oriented to person, place, and time. She appears well-developed and well-nourished. No distress.  HENT:  Head: Normocephalic and atraumatic.  Mouth/Throat: Oropharynx is clear and moist.  R TM appears slightly sclerotic.   Eyes: EOM are normal. Pupils are equal, round, and reactive to light.  Neck: Normal range of motion. Neck supple.  Cardiovascular: Normal rate and regular rhythm.   Pulmonary/Chest: Effort normal and breath sounds normal. No respiratory distress. She has no wheezes. She has no rales.  Abdominal: Soft. Bowel sounds are normal. She exhibits no distension and no mass. There is no tenderness. There is no rebound and no guarding.  Musculoskeletal: Normal range of motion. She exhibits no edema and no tenderness.   Neurological: She is alert and oriented to person, place, and time.  5/5 motor in all ext, sensation intact, finger to nose intact  Skin: Skin is warm and dry. No rash noted. No erythema.  Psychiatric: She has a normal mood and affect. Her behavior is normal.    ED Course  Procedures (including critical care time)  Labs Reviewed  URINALYSIS, ROUTINE W REFLEX MICROSCOPIC - Abnormal; Notable for the following:    Hgb urine dipstick MODERATE (*)    All other components within normal limits  COMPREHENSIVE METABOLIC PANEL - Abnormal; Notable for the following:    Glucose, Bld 114 (*)    GFR calc non Af Amer 65 (*)    GFR calc Af Amer 75 (*)    All other components within normal limits  URINE MICROSCOPIC-ADD ON - Abnormal; Notable for the following:    Squamous Epithelial / LPF MANY (*)    All other components within normal limits  CBC WITH DIFFERENTIAL  POCT PREGNANCY, URINE   No results found.   1. Peripheral vertigo, unspecified laterality       MDM  Symptoms consistent with peripheral vertigo. Will treat with meclizine and refer to ENT for persistent symptoms. Return precautions given        Loren Racer, MD 10/15/12 [phone]

## 2012-10-14 NOTE — ED Notes (Signed)
PT. REPORTS DIZZINESS WHEN WALKING /MOVING WITH LIGHTHEADEDNESS / SLIGHT HEADACHE FOR 2 DAYS . ALERT AND ORIENTED , SPEECH CLEAR / NO FACIAL ASYMMETRY , AMBULATES STEADILY , EQUAL STRONG GRIPS ,  NO ARM DRIFT.

## 2012-10-14 NOTE — ED Notes (Addendum)
Pt c/o dizziness, onset 2d ago, LMP 3d ago, reports heavy bleeding (usual for pt), no h/o dizziness, (denies: HA, pain, nvd, fever, recent sickness, congestion, cold sx). Alert, NAD, calm, skin W&D, resps e/u, speaking in clear complete sentences.

## 2012-10-15 MED ORDER — ONDANSETRON 4 MG PO TBDP: ORAL_TABLET | ORAL | Status: DC

## 2012-10-15 MED ORDER — MECLIZINE HCL 25 MG PO TABS: 25.0000 mg | ORAL_TABLET | Freq: Three times a day (TID) | ORAL | Status: DC | PRN

## 2012-11-16 ENCOUNTER — Ambulatory Visit: Payer: Managed Care, Other (non HMO) | Admitting: Physical Therapy

## 2012-11-24 ENCOUNTER — Ambulatory Visit: Payer: Managed Care, Other (non HMO) | Attending: Family Medicine | Admitting: Physical Therapy

## 2012-11-24 DIAGNOSIS — IMO0001 Reserved for inherently not codable concepts without codable children: Secondary | ICD-10-CM | POA: Insufficient documentation

## 2012-11-24 DIAGNOSIS — M2420 Disorder of ligament, unspecified site: Secondary | ICD-10-CM | POA: Insufficient documentation

## 2012-11-24 DIAGNOSIS — M629 Disorder of muscle, unspecified: Secondary | ICD-10-CM | POA: Insufficient documentation

## 2012-11-30 ENCOUNTER — Ambulatory Visit: Payer: Managed Care, Other (non HMO) | Admitting: Physical Therapy

## 2012-12-05 ENCOUNTER — Observation Stay (HOSPITAL_COMMUNITY)
Admission: EM | Admit: 2012-12-05 | Discharge: 2012-12-07 | Disposition: A | Discharge status: Home / Self Care | Payer: Managed Care, Other (non HMO) | Attending: Internal Medicine | Admitting: Internal Medicine

## 2012-12-05 ENCOUNTER — Encounter (HOSPITAL_COMMUNITY): Payer: Self-pay | Admitting: *Deleted

## 2012-12-05 DIAGNOSIS — T50902D Poisoning by unspecified drugs, medicaments and biological substances, intentional self-harm, subsequent encounter: Secondary | ICD-10-CM

## 2012-12-05 DIAGNOSIS — R42 Dizziness and giddiness: Secondary | ICD-10-CM | POA: Insufficient documentation

## 2012-12-05 DIAGNOSIS — G4700 Insomnia, unspecified: Secondary | ICD-10-CM | POA: Insufficient documentation

## 2012-12-05 DIAGNOSIS — T43502A Poisoning by unspecified antipsychotics and neuroleptics, intentional self-harm, initial encounter: Secondary | ICD-10-CM | POA: Insufficient documentation

## 2012-12-05 DIAGNOSIS — Y929 Unspecified place or not applicable: Secondary | ICD-10-CM | POA: Insufficient documentation

## 2012-12-05 DIAGNOSIS — E876 Hypokalemia: Secondary | ICD-10-CM | POA: Insufficient documentation

## 2012-12-05 DIAGNOSIS — Z598 Other problems related to housing and economic circumstances: Secondary | ICD-10-CM | POA: Insufficient documentation

## 2012-12-05 DIAGNOSIS — Z79899 Other long term (current) drug therapy: Secondary | ICD-10-CM | POA: Insufficient documentation

## 2012-12-05 DIAGNOSIS — T50902A Poisoning by unspecified drugs, medicaments and biological substances, intentional self-harm, initial encounter: Secondary | ICD-10-CM

## 2012-12-05 DIAGNOSIS — T502X5A Adverse effect of carbonic-anhydrase inhibitors, benzothiadiazides and other diuretics, initial encounter: Secondary | ICD-10-CM | POA: Insufficient documentation

## 2012-12-05 DIAGNOSIS — E119 Type 2 diabetes mellitus without complications: Secondary | ICD-10-CM | POA: Insufficient documentation

## 2012-12-05 DIAGNOSIS — F329 Major depressive disorder, single episode, unspecified: Secondary | ICD-10-CM | POA: Insufficient documentation

## 2012-12-05 DIAGNOSIS — G4710 Hypersomnia, unspecified: Secondary | ICD-10-CM | POA: Insufficient documentation

## 2012-12-05 DIAGNOSIS — F3289 Other specified depressive episodes: Secondary | ICD-10-CM | POA: Insufficient documentation

## 2012-12-05 DIAGNOSIS — Z5987 Material hardship due to limited financial resources, not elsewhere classified: Secondary | ICD-10-CM | POA: Insufficient documentation

## 2012-12-05 DIAGNOSIS — Z794 Long term (current) use of insulin: Secondary | ICD-10-CM | POA: Insufficient documentation

## 2012-12-05 DIAGNOSIS — T43294A Poisoning by other antidepressants, undetermined, initial encounter: Principal | ICD-10-CM | POA: Insufficient documentation

## 2012-12-05 DIAGNOSIS — I10 Essential (primary) hypertension: Secondary | ICD-10-CM | POA: Insufficient documentation

## 2012-12-05 DIAGNOSIS — Z7982 Long term (current) use of aspirin: Secondary | ICD-10-CM | POA: Insufficient documentation

## 2012-12-05 LAB — CBC WITH DIFFERENTIAL/PLATELET
Basophils Absolute: 0 10*3/uL (ref 0.0–0.1)
Eosinophils Relative: 0 % (ref 0–5)
HCT: 33.2 % — ABNORMAL LOW (ref 36.0–46.0)
Hemoglobin: 11.1 g/dL — ABNORMAL LOW (ref 12.0–15.0)
Lymphocytes Relative: 12 % (ref 12–46)
Lymphs Abs: 1.2 10*3/uL (ref 0.7–4.0)
MCV: 90.2 fL (ref 78.0–100.0)
Monocytes Absolute: 0.3 10*3/uL (ref 0.1–1.0)
Monocytes Relative: 3 % (ref 3–12)
Neutro Abs: 8.2 10*3/uL — ABNORMAL HIGH (ref 1.7–7.7)
RBC: 3.68 MIL/uL — ABNORMAL LOW (ref 3.87–5.11)
RDW: 12.7 % (ref 11.5–15.5)
WBC: 9.7 10*3/uL (ref 4.0–10.5)

## 2012-12-05 LAB — COMPREHENSIVE METABOLIC PANEL
AST: 13 U/L (ref 0–37)
CO2: 30 mEq/L (ref 19–32)
Calcium: 8.9 mg/dL (ref 8.4–10.5)
Chloride: 101 mEq/L (ref 96–112)
Creatinine, Ser: 0.67 mg/dL (ref 0.50–1.10)
GFR calc Af Amer: >90 mL/min (ref >90)
GFR calc non Af Amer: >90 mL/min (ref >90)
Glucose, Bld: 134 mg/dL — ABNORMAL HIGH (ref 70–99)
Total Bilirubin: 0.3 mg/dL (ref 0.3–1.2)

## 2012-12-05 LAB — ACETAMINOPHEN LEVEL: Acetaminophen (Tylenol), Serum: <15 ug/mL (ref 10–30)

## 2012-12-05 LAB — ETHANOL: Alcohol, Ethyl (B): <11 mg/dL (ref 0–11)

## 2012-12-05 MED ORDER — SODIUM CHLORIDE 0.9 % IV SOLN: Freq: Once | INTRAVENOUS | Status: AC

## 2012-12-05 NOTE — ED Notes (Signed)
Pt and belongings wanded,  Pt's medication at nurses desk and counted until pharmacy verifies.

## 2012-12-05 NOTE — ED Provider Notes (Signed)
History    CSN: [id number] Arrival date & time 12/05/12  2131  First MD Initiated Contact with Patient 12/05/12 2200     Chief Complaint  Patient presents with  . Drug Overdose   (Consider location/radiation/quality/duration/timing/severity/associated sxs/prior Treatment) HPI Comments: Patient took 40 trazodone about 8 PM tonight to help relief her stress   Patient is a 42 y.o. female presenting with Overdose. The history is provided by the patient.  Drug Overdose This is a new problem. The current episode started today. The problem occurs constantly. The problem has been unchanged. Pertinent negatives include no abdominal pain, chest pain, chills, coughing, fever, headaches or nausea. Nothing aggravates the symptoms. She has tried nothing for the symptoms.   Past Medical History  Diagnosis Date  . Hypertension   . Diabetes mellitus    Past Surgical History  Procedure Laterality Date  . Cholecystectomy     History reviewed. No pertinent family history. History  Substance Use Topics  . Smoking status: Never Smoker   . Smokeless tobacco: Never Used  . Alcohol Use: No   OB History   Grav Para Term Preterm Abortions TAB SAB Ect Mult Living                 Review of Systems  Constitutional: Negative for fever and chills.  Respiratory: Negative for cough and shortness of breath.   Cardiovascular: Negative for chest pain.  Gastrointestinal: Negative for nausea and abdominal pain.  Neurological: Negative for dizziness and headaches.  Psychiatric/Behavioral: Positive for suicidal ideas. The patient is nervous/anxious.   All other systems reviewed and are negative.    Allergies  Review of patient's allergies indicates no known allergies.  Home Medications   No current outpatient prescriptions on file. BP 108/63  Pulse 60  Temp(Src) 98.7 F (37.1 C) (Oral)  Resp 14  Ht 5\' 9"  (1.753 m)  Wt 212 lb 8.4 oz (96.4 kg)  BMI 31.37 kg/m2  SpO2 100%  LMP  11/07/2012 Physical Exam  Nursing note and vitals reviewed. Constitutional: She is oriented to person, place, and time. She appears well-developed and well-nourished.  HENT:  Head: Normocephalic.  Eyes: Pupils are equal, round, and reactive to light.  Neck: Normal range of motion.  Cardiovascular: Normal rate and regular rhythm.   Pulmonary/Chest: Effort normal and breath sounds normal.  Abdominal: Soft. Bowel sounds are normal.  Musculoskeletal: Normal range of motion.  Neurological: She is alert and oriented to person, place, and time.  Skin: Skin is warm and dry.  Psychiatric: She is slowed. She expresses impulsivity. She exhibits a depressed mood. She expresses suicidal ideation. She expresses suicidal plans.  Took Trazone to relieve her stress told EMS it was to kill herself on arrival patient resinds this stating she just wanted to sleep     ED Course  Procedures (including critical care time) Labs Reviewed  CBC WITH DIFFERENTIAL - Abnormal; Notable for the following:    RBC 3.68 (*)    Hemoglobin 11.1 (*)    HCT 33.2 (*)    Neutrophils Relative % 84 (*)    Neutro Abs 8.2 (*)    All other components within normal limits  COMPREHENSIVE METABOLIC PANEL - Abnormal; Notable for the following:    Glucose, Bld 134 (*)    Albumin 3.3 (*)    All other components within normal limits  SALICYLATE LEVEL - Abnormal; Notable for the following:    Salicylate Lvl <2.0 (*)    All other components within normal  limits  TSH - Abnormal; Notable for the following:    TSH 4.943 (*)    All other components within normal limits  GLUCOSE, CAPILLARY - Abnormal; Notable for the following:    Glucose-Capillary 113 (*)    All other components within normal limits  GLUCOSE, CAPILLARY - Abnormal; Notable for the following:    Glucose-Capillary 129 (*)    All other components within normal limits  GLUCOSE, CAPILLARY - Abnormal; Notable for the following:    Glucose-Capillary 148 (*)    All other  components within normal limits  ACETAMINOPHEN LEVEL  URINE RAPID DRUG SCREEN (HOSP PERFORMED)  ETHANOL  MAGNESIUM  GLUCOSE, CAPILLARY  MAGNESIUM  BASIC METABOLIC PANEL   No results found. 1. Medication overdose, initial encounter   2. DM (diabetes mellitus)   3. HTN (hypertension)   4. Suicidal overdose, initial encounter    ED ECG REPORT   Date: 12/06/2012  EKG Time: 7:52 PM  Rate: 61  Rhythm: normal sinus rhythm,  unchanged from previous tracings  Axis: noraml   Intervals:none  ST&T Change: none OTC was 417 on 2/14 now 492   Narrative Interpretation: borderline            MDM   Will admit for observation     Arman Filter, NP 12/06/12 1952  Arman Filter, NP 12/06/12 1952

## 2012-12-05 NOTE — ED Notes (Signed)
Spoke with poison control ([place])  Advised of overdose  50mg   Qty 40  Trazodone,  Advised of watching for QT prolongation, seizures,  Drowsy 6 - 8 hours,  CNS depression,  All regular blood work for overdose

## 2012-12-05 NOTE — ED Notes (Signed)
Clearance Coots [phone],  Zoe Lan  [phone]

## 2012-12-05 NOTE — ED Notes (Signed)
EAV:[id number]> Expected date:<BR> Expected time:<BR> Means of arrival:<BR> Comments:<BR> EMS

## 2012-12-05 NOTE — ED Notes (Signed)
Per EMS pt transported from home after taking Trazodone 50mg  x 40 at @ 2015. EMS states pt was initially has SI but has since recanted and is no longer suicidal. #20 L wrist established. A & O, PWD, SR on 12 lead.

## 2012-12-06 ENCOUNTER — Encounter (HOSPITAL_COMMUNITY): Payer: Self-pay | Admitting: Internal Medicine

## 2012-12-06 DIAGNOSIS — T50902A Poisoning by unspecified drugs, medicaments and biological substances, intentional self-harm, initial encounter: Secondary | ICD-10-CM

## 2012-12-06 DIAGNOSIS — E119 Type 2 diabetes mellitus without complications: Secondary | ICD-10-CM

## 2012-12-06 DIAGNOSIS — T50901A Poisoning by unspecified drugs, medicaments and biological substances, accidental (unintentional), initial encounter: Secondary | ICD-10-CM

## 2012-12-06 DIAGNOSIS — I10 Essential (primary) hypertension: Secondary | ICD-10-CM

## 2012-12-06 LAB — GLUCOSE, CAPILLARY
Glucose-Capillary: 113 mg/dL — ABNORMAL HIGH (ref 70–99)
Glucose-Capillary: 129 mg/dL — ABNORMAL HIGH (ref 70–99)
Glucose-Capillary: 148 mg/dL — ABNORMAL HIGH (ref 70–99)
Glucose-Capillary: 71 mg/dL (ref 70–99)

## 2012-12-06 LAB — RAPID URINE DRUG SCREEN, HOSP PERFORMED
Amphetamines: NOT DETECTED
Tetrahydrocannabinol: NOT DETECTED

## 2012-12-06 LAB — TSH: TSH: 4.943 u[IU]/mL — ABNORMAL HIGH (ref 0.350–4.500)

## 2012-12-06 MED ORDER — HYDROCHLOROTHIAZIDE 25 MG PO TABS
25.0000 mg | ORAL_TABLET | Freq: Every day | ORAL | Status: DC
  Administered 2012-12-06 – 2012-12-07 (×2): 25 mg via ORAL
  Filled 2012-12-06 (×3): qty 1

## 2012-12-06 MED ORDER — INSULIN ASPART PROT & ASPART (70-30 MIX) 100 UNIT/ML ~~LOC~~ SUSP
15.0000 [IU] | Freq: Every day | SUBCUTANEOUS | Status: DC
  Administered 2012-12-06 – 2012-12-07 (×2): 15 [IU] via SUBCUTANEOUS
  Filled 2012-12-06 (×2): qty 10

## 2012-12-06 MED ORDER — IBUPROFEN 200 MG PO TABS
200.0000 mg | ORAL_TABLET | Freq: Three times a day (TID) | ORAL | Status: DC | PRN
  Administered 2012-12-06: 200 mg via ORAL
  Filled 2012-12-06: qty 1

## 2012-12-06 MED ORDER — ASPIRIN EC 81 MG PO TBEC
81.0000 mg | DELAYED_RELEASE_TABLET | Freq: Every day | ORAL | Status: DC
  Administered 2012-12-06 – 2012-12-07 (×2): 81 mg via ORAL
  Filled 2012-12-06 (×3): qty 1

## 2012-12-06 MED ORDER — LISINOPRIL 20 MG PO TABS
20.0000 mg | ORAL_TABLET | Freq: Every day | ORAL | Status: DC
  Administered 2012-12-06 – 2012-12-07 (×2): 20 mg via ORAL
  Filled 2012-12-06 (×3): qty 1

## 2012-12-06 MED ORDER — INSULIN ASPART PROT & ASPART (70-30 MIX) 100 UNIT/ML ~~LOC~~ SUSP: 15.0000 [IU] | Freq: Two times a day (BID) | SUBCUTANEOUS | Status: DC

## 2012-12-06 MED ORDER — INSULIN ASPART PROT & ASPART (70-30 MIX) 100 UNIT/ML ~~LOC~~ SUSP
18.0000 [IU] | Freq: Every day | SUBCUTANEOUS | Status: DC
  Administered 2012-12-06: 18 [IU] via SUBCUTANEOUS
  Filled 2012-12-06: qty 10

## 2012-12-06 MED ORDER — ONDANSETRON HCL 4 MG PO TABS: 4.0000 mg | ORAL_TABLET | Freq: Four times a day (QID) | ORAL | Status: DC | PRN

## 2012-12-06 MED ORDER — NORETHINDRONE 0.35 MG PO TABS: 1.0000 | ORAL_TABLET | Freq: Every day | ORAL | Status: DC

## 2012-12-06 MED ORDER — PNEUMOCOCCAL VAC POLYVALENT 25 MCG/0.5ML IJ INJ
0.5000 mL | INJECTION | INTRAMUSCULAR | Status: AC
  Administered 2012-12-07: 0.5 mL via INTRAMUSCULAR
  Filled 2012-12-06 (×2): qty 0.5

## 2012-12-06 MED ORDER — SODIUM CHLORIDE 0.9 % IJ SOLN
3.0000 mL | Freq: Two times a day (BID) | INTRAMUSCULAR | Status: DC
  Administered 2012-12-06 – 2012-12-07 (×3): 3 mL via INTRAVENOUS

## 2012-12-06 MED ORDER — INSULIN ASPART 100 UNIT/ML ~~LOC~~ SOLN: 0.0000 [IU] | Freq: Every day | SUBCUTANEOUS | Status: DC

## 2012-12-06 MED ORDER — ONDANSETRON HCL 4 MG/2ML IJ SOLN: 4.0000 mg | Freq: Four times a day (QID) | INTRAMUSCULAR | Status: DC | PRN

## 2012-12-06 MED ORDER — INSULIN ASPART 100 UNIT/ML ~~LOC~~ SOLN
0.0000 [IU] | Freq: Three times a day (TID) | SUBCUTANEOUS | Status: DC
  Administered 2012-12-06 (×2): 2 [IU] via SUBCUTANEOUS
  Administered 2012-12-07: 1 [IU] via SUBCUTANEOUS

## 2012-12-06 MED ORDER — LISINOPRIL-HYDROCHLOROTHIAZIDE 20-25 MG PO TABS: 1.0000 | ORAL_TABLET | Freq: Every day | ORAL | Status: DC

## 2012-12-06 MED ORDER — IBUPROFEN 800 MG PO TABS
800.0000 mg | ORAL_TABLET | Freq: Three times a day (TID) | ORAL | Status: DC | PRN
  Administered 2012-12-06 – 2012-12-07 (×3): 800 mg via ORAL
  Filled 2012-12-06 (×3): qty 1

## 2012-12-06 NOTE — Progress Notes (Signed)
Clinical Social Work Department CLINICAL SOCIAL WORK PSYCHIATRY SERVICE LINE ASSESSMENT 12/06/2012  Patient:  Hayley Bryant  Account:  [id number]  Admit Date:  12/05/2012  Clinical Social Worker:  Unk Lightning, LCSW  Date/Time:  12/06/2012 02:45 PM Referred by:  Physician  Date referred:  12/06/2012 Reason for Referral  Psychosocial assessment   Presenting Symptoms/Problems (In the person's/family's own words):   Psych consulted due to overdose.   Abuse/Neglect/Trauma History (check all that apply)  Denies history   Abuse/Neglect/Trauma Comments:   Psychiatric History (check all that apply)  Outpatient treatment   Psychiatric medications:  Effexor XR 75 mg   Current Mental Health Hospitalizations/Previous Mental Health History:   Patient reports that her PCP referred her to a psychiatrist and therapist. Patient saw psychiatrist once about 1 month ago. Patient saw a therapist but has not followed up in about 2-3 months.   Current provider:   Dr. Morris-psychiatrist   Place and Date:   [facility]   Current Medications:   ondansetron (ZOFRAN) IV, ondansetron            . aspirin EC  81 mg Oral Daily  . lisinopril  20 mg Oral Daily   And     . hydrochlorothiazide  25 mg Oral Daily  . insulin aspart  0-5 Units Subcutaneous QHS  . insulin aspart  0-9 Units Subcutaneous TID WC  . insulin aspart protamine- aspart  15 Units Subcutaneous Q breakfast  . insulin aspart protamine- aspart  18 Units Subcutaneous Q supper  . norethindrone  1 tablet Oral Daily  . [START ON 12/07/2012] pneumococcal 23 valent vaccine  0.5 mL Intramuscular Tomorrow-1000  . sodium chloride  3 mL Intravenous Q12H   Previous Impatient Admission/Date/Reason:   None reported   Emotional Health / Current Symptoms    Suicide/Self Harm  Suicide attempt in past (date/description)   Suicide attempt in the past:   Patient was admitted after overdosing on Trazodone. Patient reports that this  is her first attempt and is remorseful about her actions.   Other harmful behavior:   None reported   Psychotic/Dissociative Symptoms  None reported   Other Psychotic/Dissociative Symptoms:   Patient denies any psychotic symptoms.    Attention/Behavioral Symptoms  Within Normal Limits   Other Attention / Behavioral Symptoms:   Patient engaged throughout assessment.    Cognitive Impairment  Orientation - Place  Orientation - Self  Orientation - Situation  Orientation - Time   Other Cognitive Impairment:    Mood and Adjustment  Flat    Stress, Anxiety, Trauma, Any Recent Loss/Stressor  Other - See comment   Anxiety (frequency):   N/A   Phobia (specify):   N/A   Compulsive behavior (specify):   N/A   Obsessive behavior (specify):   N/A   Other:   Patient reports significant financial stress. Patient reports that her husband does not work and that they cannot afford to pay all of their bills.   Substance Abuse/Use  None   SBIRT completed (please refer for detailed history):  N  Self-reported substance use:   Patient denies any substance use.   Urinary Drug Screen Completed:  Y Alcohol level:   <11    Environmental/Housing/Living Arrangement  Stable housing   Who is in the home:   Husband   Emergency contact:  Educational psychologist   Patient's Strengths and Goals (patient's own words):   Patient reports that family is supportive and that she  has a close relationship with oldest son and her dtr.   Clinical Social Worker's Interpretive Summary:   CSW received referral to complete psychosocial assessment. CSW reviewed chart and met with patient at bedside. CSW introduced myself and explained role. Patient's husband was in the room but patient preferred to speak privately with husband. Husband exited room during CSW assessment.    Patient lives with husband and has three children. Patient and husband have been married for  several years but husband is not currently working so patient feels that the financial stress has increased. Patient currently has a job but reports the hours are not steady and feels that she became overwhelmed. Patient took several Trazodone pills due to the stress and her son called 911. Patient reports she regrets this decision and realizes it has caused her family significant stress.    Patient reports that she has felt depressed for several years. Patient expressed depression to PCP and PCP referred patient to psychiatrist. Patient has seen psychiatrist once and reports that he did not change her medication. Patient felt that medication was beneficial until it "just stopped working" on the day she attempted to overdose. Patient reports that she was seeing a therapist but has not gone back in several months. Patient cannot remember her therapist's name.    Patient reports that she regrets her decision and recognizes that she needs treatment. Patient has never been hospitalized and does not want inpatient treatment. CSW explained that psych MD would complete assessment to determine what level of care she would need at DC. CSW explained that CSW would then assist with arranging what MD recommended.    Patient was alert and oriented throughout assessment. Patient had flat affect with minimal eye contact. Patient reports she is embarrassed by her actions and just an impulsive moment. Patient could benefit from treatment in order to learn to identify triggers and to work on positive coping skills during stressful events.    CSW will continue to follow to assist with psych MD recommendations.   Disposition:  Recommend Psych CSW continuing to support while in hospital

## 2012-12-06 NOTE — ED Notes (Signed)
Report to floor,  Advised pt is telemetry and SI overdose,  Floor nurse to call [facility] for sitter when pt arrives to floor

## 2012-12-06 NOTE — ED Provider Notes (Signed)
Medical screening examination/treatment/procedure(s) were performed by non-physician practitioner and as supervising physician I was immediately available for consultation/collaboration.   Erikka Follmer H Lyfe Monger, MD 12/06/12 2254 

## 2012-12-06 NOTE — ED Notes (Signed)
Pt has low b/p,  Pt is not symptomatic,  Pt given 500cc bolus and repositioned in bed,  Pt was also ambulatory to bathroom without incident,

## 2012-12-06 NOTE — ED Notes (Signed)
Pt's medication sent to pharmacy, sheet signed by this writer and pharmacy tech,  White slip in pt's chart

## 2012-12-06 NOTE — H&P (Signed)
[facility] History and Physical  Hayley Bryant NWG:[id number] DOB: 1970/12/06    PCP:   Cameron Sprang, FNP   Chief Complaint: suicidal attempt by taking 40 tablets of Trazadone 50mg  about 3 hours prior to arrival.  HPI: Hayley Bryant is an 42 y.o. female with hx of HTN, DM, insomnia on Trazadone 50mg  q hs PRN, depression and having financial stress, presents to the ER with suicidal attempt by taking 40 tablets of Trazadone.  She is sleepy, but otherwise alert, orient, and gave meaningful conversation.  She has three sons and is married, and took these pills as a suicidal attempt, but later changed her mind, and is currently not actively suicidal.  She had no prior attempt.  For her DM, she uses Mixtard 70/30 about 15-18 units BID.  She is also on HTN meds, but denied any alcohol or drug use.  Work up in the ER included an EKG in NSR with QTc of 490 milliseconds.  Her electrolytes and renal fx tests were normal as well.  Hospitalist was asked to admit her for observation.  Rewiew of Systems:  Constitutional: Negative for malaise, fever and chills. No significant weight loss or weight gain Eyes: Negative for eye pain, redness and discharge, diplopia, visual changes, or flashes of light. ENMT: Negative for ear pain, hoarseness, nasal congestion, sinus pressure and sore throat. No headaches; tinnitus, drooling, or problem swallowing. Cardiovascular: Negative for chest pain, palpitations, diaphoresis, dyspnea and peripheral edema. ; No orthopnea, PND Respiratory: Negative for cough, hemoptysis, wheezing and stridor. No pleuritic chestpain. Gastrointestinal: Negative for nausea, vomiting, diarrhea, constipation, abdominal pain, melena, blood in stool, hematemesis, jaundice and rectal bleeding.    Genitourinary: Negative for frequency, dysuria, incontinence,flank pain and hematuria; Musculoskeletal: Negative for back pain and neck pain. Negative for swelling and trauma.;  Skin: . Negative  for pruritus, rash, abrasions, bruising and skin lesion.; ulcerations Neuro: Negative for headache, lightheadedness and neck stiffness. Negative for weakness, altered level of consciousness , altered mental status, extremity weakness, burning feet, involuntary movement, seizure and syncope.  Psych: negative for tearfulness, panic attacks, hallucinations, paranoia, suicidal or homicidal ideation    Past Medical History  Diagnosis Date  . Hypertension   . Diabetes mellitus     Past Surgical History  Procedure Laterality Date  . Cholecystectomy      Medications:  HOME MEDS: Prior to Admission medications   Medication Sig Start Date End Date Taking? Authorizing Provider  aspirin EC 81 MG tablet Take 81 mg by mouth daily.     Yes Historical Provider, MD  insulin aspart protamine-insulin aspart (NOVOLOG 70/30) (70-30) 100 UNIT/ML injection Inject 15-18 Units into the skin 2 (two) times daily with a meal. 15 units in the morning and 18 units in the evening   Yes Historical Provider, MD  lisinopril-hydrochlorothiazide (PRINZIDE,ZESTORETIC) 20-25 MG per tablet Take 1 tablet by mouth daily.   Yes Historical Provider, MD  meclizine (ANTIVERT) 25 MG tablet Take 1 tablet (25 mg total) by mouth 3 (three) times daily as needed for dizziness. 10/15/12  Yes Loren Racer, MD  norethindrone (MICRONOR,CAMILA,ERRIN) 0.35 MG tablet Take 1 tablet by mouth daily.   Yes Historical Provider, MD  ondansetron (ZOFRAN ODT) 4 MG disintegrating tablet 4mg  ODT q4 hours prn nausea/vomit 10/15/12  Yes Loren Racer, MD  traZODone (DESYREL) 50 MG tablet Take 50 mg by mouth at bedtime.   Yes Historical Provider, MD  venlafaxine XR (EFFEXOR-XR) 75 MG 24 hr capsule Take 75 mg by mouth every  morning.   Yes Historical Provider, MD     Allergies:  No Known Allergies  Social History:   reports that she has never smoked. She has never used smokeless tobacco. She reports that she does not drink alcohol or use illicit  drugs.  Family History: History reviewed. No pertinent family history.   Physical Exam: Filed Vitals:   12/05/12 2131 12/05/12 2138  BP: 106/67   Pulse: 70   Temp: 98.6 F (37 C)   TempSrc: Oral   Resp: 16   SpO2: 95% 96%   Blood pressure 106/67, pulse 70, temperature 98.6 F (37 C), temperature source Oral, resp. rate 16, last menstrual period 11/07/2012, SpO2 96.00%.  GEN:  Pleasant patient lying in the stretcher in no acute distress; cooperative with exam. PSYCH:  alert and oriented x4; does not appear anxious or depressed; affect is appropriate. HEENT: Mucous membranes pink and anicteric; PERRLA; EOM intact; no cervical lymphadenopathy nor thyromegaly or carotid bruit; no JVD; There were no stridor. Neck is very supple. Breasts:: Not examined CHEST WALL: No tenderness CHEST: Normal respiration, clear to auscultation bilaterally.  HEART: Regular rate and rhythm.  There are no murmur, rub, or gallops.   BACK: No kyphosis or scoliosis; no CVA tenderness ABDOMEN: soft and non-tender; no masses, no organomegaly, normal abdominal bowel sounds; no pannus; no intertriginous candida. There is no rebound and no distention. Rectal Exam: Not done EXTREMITIES: No bone or joint deformity; age-appropriate arthropathy of the hands and knees; no edema; no ulcerations.  There is no calf tenderness. Genitalia: not examined PULSES: 2+ and symmetric SKIN: Normal hydration no rash or ulceration CNS: Cranial nerves 2-12 grossly intact no focal lateralizing neurologic deficit.  Speech is fluent; uvula elevated with phonation, facial symmetry and tongue midline. DTR are normal bilaterally, cerebella exam is intact, barbinski is negative and strengths are equaled bilaterally.  No sensory loss.   Labs on Admission:  Basic Metabolic Panel:  Recent Labs Lab 12/05/12 2218  NA 137  K 3.5  CL 101  CO2 30  GLUCOSE 134*  BUN 12  CREATININE 0.67  CALCIUM 8.9   Liver Function Tests:  Recent  Labs Lab 12/05/12 2218  AST 13  ALT 10  ALKPHOS 48  BILITOT 0.3  PROT 6.7  ALBUMIN 3.3*   No results found for this basename: LIPASE, AMYLASE,  in the last 168 hours No results found for this basename: AMMONIA,  in the last 168 hours CBC:  Recent Labs Lab 12/05/12 2218  WBC 9.7  NEUTROABS 8.2*  HGB 11.1*  HCT 33.2*  MCV 90.2  PLT 251   Cardiac Enzymes: No results found for this basename: CKTOTAL, CKMB, CKMBINDEX, TROPONINI,  in the last 168 hours  CBG: No results found for this basename: GLUCAP,  in the last 168 hours   Radiological Exams on Admission: No results found.  EKG: Independently reviewed. QTc 490 milliseconds.   Assessment/Plan  Suicidal attempt Trazadone overdose HTN DM2 Depression  PLAN:  Poison control didn't recommend AC.  QTc is slightly elevated, but still less than 500 seconds.  Will admit to telemetry and monitor QTc.  Will get Mag level as well.  For her DM, will continue mixtard and give sensitive SSI.  She is on suicidal precaution and will have sitter.  Please consult psychiatry in the morning.  She is stable, full code, and will be admitted OBS under [facility] service.  Thank you for allowing me to partake in the care of this nice patient.  Other plans as per orders.  Code Status: FULL Unk Lightning, MD. [facility] Pager [phone] 7pm to 7am.  12/06/2012, 12:51 AM

## 2012-12-06 NOTE — Progress Notes (Signed)
Patient seen and examined. Admitted after midnight secondary to suicidal attempt with hand full of trazodone. Please referred to Dr. Irwin Brakeman H&P for further info/details on admission. Patient is currently AAOX3, denies CP, SOB, abd pain, nausea, vomiting or any other complaints. She denies SI or suicidal thoughts and ise feeling remursed/ashamed with whole situation.  Plan: -follow electrolytes -watch on telemetry -psych consulted and will follow rec's -rest of medical problems stable and will continue current medication regimen  Hayley Bryant [phone]

## 2012-12-06 NOTE — ED Notes (Signed)
Room secure.  Pt within site to maintain safety.

## 2012-12-07 DIAGNOSIS — E876 Hypokalemia: Secondary | ICD-10-CM

## 2012-12-07 DIAGNOSIS — Z5189 Encounter for other specified aftercare: Secondary | ICD-10-CM

## 2012-12-07 DIAGNOSIS — F339 Major depressive disorder, recurrent, unspecified: Secondary | ICD-10-CM

## 2012-12-07 LAB — BASIC METABOLIC PANEL
GFR calc Af Amer: 86 mL/min — ABNORMAL LOW (ref >90)
GFR calc non Af Amer: 74 mL/min — ABNORMAL LOW (ref >90)
Potassium: 3.2 mEq/L — ABNORMAL LOW (ref 3.5–5.1)
Sodium: 137 mEq/L (ref 135–145)

## 2012-12-07 LAB — GLUCOSE, CAPILLARY: Glucose-Capillary: 117 mg/dL — ABNORMAL HIGH (ref 70–99)

## 2012-12-07 MED ORDER — LISINOPRIL-HYDROCHLOROTHIAZIDE 20-25 MG PO TABS: 1.0000 | ORAL_TABLET | Freq: Every day | ORAL | Status: DC

## 2012-12-07 MED ORDER — SODIUM CHLORIDE 0.9 % IV BOLUS (SEPSIS)
500.0000 mL | Freq: Once | INTRAVENOUS | Status: AC
  Administered 2012-12-07: 500 mL via INTRAVENOUS

## 2012-12-07 MED ORDER — POTASSIUM CHLORIDE CRYS ER 20 MEQ PO TBCR
40.0000 meq | EXTENDED_RELEASE_TABLET | Freq: Every day | ORAL | Status: DC
  Administered 2012-12-07: 40 meq via ORAL
  Filled 2012-12-07: qty 2

## 2012-12-07 MED ORDER — MECLIZINE HCL 25 MG PO TABS
25.0000 mg | ORAL_TABLET | Freq: Three times a day (TID) | ORAL | Status: DC | PRN
  Administered 2012-12-07: 25 mg via ORAL
  Filled 2012-12-07: qty 1

## 2012-12-07 MED ORDER — VENLAFAXINE HCL ER 75 MG PO CP24
75.0000 mg | ORAL_CAPSULE | Freq: Every morning | ORAL | Status: DC
  Administered 2012-12-07: 75 mg via ORAL
  Filled 2012-12-07: qty 1

## 2012-12-07 NOTE — Progress Notes (Signed)
Voices understanding of discharge instructions.  No changes noted since am assessment.

## 2012-12-07 NOTE — Progress Notes (Signed)
Patient's husband brought in some clothes and shoes for patient last night, items locked in suicide precautions cabinet on [address]. Leeroy Cha

## 2012-12-07 NOTE — Progress Notes (Signed)
Clinical Social Work  Per psych MD, patient is able to follow up on outpatient basis for treatment. Patient active with [facility] and agreeable to follow up at their facility. CSW called [facility] but unable to schedule appointment at this time. Patient aware and agreeable to schedule her own appointment.   CSW is signing off but available if further needs arise.  [place], [place] [phone]

## 2012-12-07 NOTE — Progress Notes (Signed)
Pt c/o dizziness and states it feels like vertigo.  Text page sent to Dr Gwenlyn Perking.

## 2012-12-07 NOTE — Discharge Summary (Signed)
Physician Discharge Summary  Hayley Bryant ZOX:[id number] DOB: 1970/10/26 DOA: 12/05/2012  PCP: Cameron Sprang, FNP  Admit date: 12/05/2012 Discharge date: 12/07/2012  Time spent: >30 minutes  Recommendations for Outpatient Follow-up:  1. BMET to follow electrolytes 2. Reassess BP and adjust medications as needed 3. Medication refills 4. Needs follow up with Dr. Langston Masker for psychotherapy   Discharge Diagnoses:  Principal Problem:   Suicidal overdose Active Problems:   HTN (hypertension)   DM (diabetes mellitus) vertigo hypokalemia  Discharge Condition: stable and improved. No active hallucinations or SI.  Diet recommendation: heart healthy and low carb diet  Filed Weights   12/06/12 0811  Weight: 96.4 kg (212 lb 8.4 oz)    History of present illness:  42 y.o. female with hx of HTN, DM, insomnia on Trazadone 50mg  q hs PRN, depression and having financial stress, presents to the ER with suicidal attempt by taking 40 tablets of Trazadone. She is sleepy, but otherwise alert, orient, and gave meaningful conversation. She has three sons and is married, and took these pills as a suicidal attempt, but later changed her mind, and is currently not actively suicidal. She had no prior attempt. For her DM, she uses Mixtard 70/30 about 15-18 units BID. She is also on HTN meds, but denied any alcohol or drug use. Work up in the ER included an EKG in NSR with QTc of 490 milliseconds. Her electrolytes and renal fx tests were normal as well. Hospitalist was asked to admit her for observation.   Hospital Course:  1-Suicidal attempt with trazodone overdose: patient w/o SI or hallucinations. Per Psychiatry recommendations ok to discharge on effexor and to follow for psychotherapy and further psychiatry evaluation in the outpatient setting with Dr. Langston Masker. -Appointment made by SW -patient will follow with PCP for refills on her effexor -at discharge stable and in understanding of calling 911 or  visiting nearest ER room if further negative thoughts or SI recurrence -Trazodone discontinue.  2-HTN:stable/soft. HCTZ on hold until follow up with PCP -advise to follow a low sodium diet  3-DM: stable. Will continue home medications and follow with PCP for further adjustments.  4-hypokalemia: repleted. Due to diuretics.  5-vertigo: continue PRN meclizine  *Rest of medical conditions remains stable and the plan is to continue current medication regimen.    Procedures:  none  Consultations:  Psychiatry  Discharge Exam: Filed Vitals:   12/06/12 0815 12/06/12 1300 12/06/12 2115 12/07/12 0557  BP: 101/67 108/63 98/60 107/56  Pulse: 51 60 58 60  Temp: 98.6 F (37 C) 98.7 F (37.1 C) 98.7 F (37.1 C) 98.4 F (36.9 C)  TempSrc: Oral Oral Oral Oral  Resp: 16 14 15 16   Height:      Weight:      SpO2: 100% 100% 100% 100%    General: NAD, afebrile; AAOX3, no SI or hallucinations Cardiovascular: S1 and S2, no rubs or gallops Respiratory: CTA bilaterally Abdomen: soft, ND, positive BS, no guarding. Patient reports some cramping pain due to menstrual cycle. Extremities: no edema, no cyanosis Neuro: no focal deficit  Discharge Instructions  Discharge Orders   Future Appointments Provider Department Dept Phone   12/22/2012 11:00 AM Theressa Millard, PT [facility] [phone]   Future Orders Complete By Expires     Diet - low sodium heart healthy  As directed     Discharge instructions  As directed     Comments:      TAKE MEDICATIONS AS PRESCRIBED FOLLOW WITH PRIMARY CARE  DOCTOR IN 5-7 DAYS ARRANGE FOLLOW UP WITH PSYCHIATRY OVER IN 3 DAYS        Medication List    STOP taking these medications       traZODone 50 MG tablet  Commonly known as:  DESYREL      TAKE these medications       aspirin EC 81 MG tablet  Take 81 mg by mouth daily.     insulin aspart protamine- aspart (70-30) 100 UNIT/ML injection  Commonly known as:   NOVOLOG MIX 70/30  Inject 15-18 Units into the skin 2 (two) times daily with a meal. 15 units in the morning and 18 units in the evening     lisinopril-hydrochlorothiazide 20-25 MG per tablet  Commonly known as:  PRINZIDE,ZESTORETIC  Take 1 tablet by mouth daily. HOLD UNTIL FOLLOW UP WITH PRIMARY CARE DOCTOR     meclizine 25 MG tablet  Commonly known as:  ANTIVERT  Take 1 tablet (25 mg total) by mouth 3 (three) times daily as needed for dizziness.     norethindrone 0.35 MG tablet  Commonly known as:  MICRONOR,CAMILA,ERRIN  Take 1 tablet by mouth daily.     ondansetron 4 MG disintegrating tablet  Commonly known as:  ZOFRAN ODT  4mg  ODT q4 hours prn nausea/vomit     venlafaxine XR 75 MG 24 hr capsule  Commonly known as:  EFFEXOR-XR  Take 75 mg by mouth every morning.       No Known Allergies     Follow-up Information   Call [facility]. (To schedule a follow up appointment)    Contact information:   [address] [place], [place] [address] [phone]      Follow up with Cameron Sprang, FNP. Schedule an appointment as soon as possible for a visit in 5 days.   Contact information:   [address] [place] [address] [phone]       The results of significant diagnostics from this hospitalization (including imaging, microbiology, ancillary and laboratory) are listed below for reference.     Labs: Basic Metabolic Panel:  Recent Labs Lab 12/05/12 2218 12/07/12 0510  NA 137 137  K 3.5 3.2*  CL 101 104  CO2 30 26  GLUCOSE 134* 120*  BUN 12 13  CREATININE 0.67 0.94  CALCIUM 8.9 8.9  MG 1.7 1.9   Liver Function Tests:  Recent Labs Lab 12/05/12 2218  AST 13  ALT 10  ALKPHOS 48  BILITOT 0.3  PROT 6.7  ALBUMIN 3.3*   CBC:  Recent Labs Lab 12/05/12 2218  WBC 9.7  NEUTROABS 8.2*  HGB 11.1*  HCT 33.2*  MCV 90.2  PLT 251   CBG:  Recent Labs Lab 12/06/12 1412 12/06/12 1734 12/06/12 2211 12/07/12 0726  12/07/12 1149  GLUCAP 129* 148* 71 117* 139*     Signed:  Kyrstan Gotwalt  [facility] 12/07/2012, 1:39 PM

## 2012-12-07 NOTE — Consult Note (Signed)
Reason for Consult: Suicidal attempt Referring Physician: Dr. Alanson Aly is an 42 y.o. female.  HPI: Patient is a 42 year old African American married employed female who was admitted on the medical floor after taking overdose on her trazodone.  Patient stated that she was very upset and admitted feeling depressed due to financial distress.  She's only one who was working.  She lives with her husband and 3 children.  Patient told it was an accident when she took the medication.  She is seeing Dr. Langston Masker who is a psychologist however admitted not seeing him for past one month.  Her psychotropic medication is managed by her primary care physician.  Patient denies any psychosis, hallucination, paranoia or any active suicidal thoughts at this time.  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  Patient admitted not getting enough money on her current job.  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  She is hopeful about her future.  She regrets about her suicidal attempt.  She wants to live.  She wants to live for her children, husband and hoping to start a new job.  Patient is not drinking or using any illegal substance.  She takes Effexor from her primary care physician.  Patient admitted has been depressed for past few months do to financial issues.  She denies any aggression violence.  She admitted decreased energy anhedonia and some time chronic feelings of hopelessness.  However felt that she did in his take and would not do it again.  She wants to be discharged and 1 to restart her medication and followup with Dr. Langston Masker.    Past Medical History  Diagnosis Date  . Hypertension   . Diabetes mellitus     Past Surgical History  Procedure Laterality Date  . Cholecystectomy      History reviewed. No pertinent family history.  Social History:  reports that she has never smoked. She has never used smokeless tobacco. She reports that she does not drink alcohol  or use illicit drugs.  Allergies: No Known Allergies  Medications: I have reviewed the patient's current medications.  Results for orders placed during the hospital encounter of 12/05/12 (from the past 48 hour(s))  CBC WITH DIFFERENTIAL     Status: Abnormal   Collection Time    12/05/12 10:18 PM      Result Value Range   WBC 9.7  4.0 - 10.5 K/uL   RBC 3.68 (*) 3.87 - 5.11 MIL/uL   Hemoglobin 11.1 (*) 12.0 - 15.0 g/dL   HCT 62.1 (*) 30.8 - 65.7 %   MCV 90.2  78.0 - 100.0 fL   MCH 30.2  26.0 - 34.0 pg   MCHC 33.4  30.0 - 36.0 g/dL   RDW 84.6  96.2 - 95.2 %   Platelets 251  150 - 400 K/uL   Neutrophils Relative % 84 (*) 43 - 77 %   Neutro Abs 8.2 (*) 1.7 - 7.7 K/uL   Lymphocytes Relative 12  12 - 46 %   Lymphs Abs 1.2  0.7 - 4.0 K/uL   Monocytes Relative 3  3 - 12 %   Monocytes Absolute 0.3  0.1 - 1.0 K/uL   Eosinophils Relative 0  0 - 5 %   Eosinophils Absolute 0.0  0.0 - 0.7 K/uL   Basophils Relative 0  0 - 1 %   Basophils Absolute 0.0  0.0 - 0.1 K/uL  COMPREHENSIVE METABOLIC PANEL  Status: Abnormal   Collection Time    12/05/12 10:18 PM      Result Value Range   Sodium 137  135 - 145 mEq/L   Potassium 3.5  3.5 - 5.1 mEq/L   Chloride 101  96 - 112 mEq/L   CO2 30  19 - 32 mEq/L   Glucose, Bld 134 (*) 70 - 99 mg/dL   BUN 12  6 - 23 mg/dL   Creatinine, Ser 1.61  0.50 - 1.10 mg/dL   Calcium 8.9  8.4 - 09.6 mg/dL   Total Protein 6.7  6.0 - 8.3 g/dL   Albumin 3.3 (*) 3.5 - 5.2 g/dL   AST 13  0 - 37 U/L   ALT 10  0 - 35 U/L   Alkaline Phosphatase 48  39 - 117 U/L   Total Bilirubin 0.3  0.3 - 1.2 mg/dL   GFR calc non Af Amer >90  >90 mL/min   GFR calc Af Amer >90  >90 mL/min   Comment:            The eGFR has been calculated     using the CKD EPI equation.     This calculation has not been     validated in all clinical     situations.     eGFR's persistently     <90 mL/min signify     possible Chronic Kidney Disease.  SALICYLATE LEVEL     Status: Abnormal    Collection Time    12/05/12 10:18 PM      Result Value Range   Salicylate Lvl <2.0 (*) 2.8 - 20.0 mg/dL  ACETAMINOPHEN LEVEL     Status: None   Collection Time    12/05/12 10:18 PM      Result Value Range   Acetaminophen (Tylenol), Serum <15.0  10 - 30 ug/mL   Comment:            THERAPEUTIC CONCENTRATIONS VARY     SIGNIFICANTLY. A RANGE OF 10-30     ug/mL MAY BE AN EFFECTIVE     CONCENTRATION FOR MANY PATIENTS.     HOWEVER, SOME ARE BEST TREATED     AT CONCENTRATIONS OUTSIDE THIS     RANGE.     ACETAMINOPHEN CONCENTRATIONS     >150 ug/mL AT 4 HOURS AFTER     INGESTION AND >50 ug/mL AT 12     HOURS AFTER INGESTION ARE     OFTEN ASSOCIATED WITH TOXIC     REACTIONS.  ETHANOL     Status: None   Collection Time    12/05/12 10:18 PM      Result Value Range   Alcohol, Ethyl (B) <11  0 - 11 mg/dL   Comment:            LOWEST DETECTABLE LIMIT FOR     SERUM ALCOHOL IS 11 mg/dL     FOR MEDICAL PURPOSES ONLY  TSH     Status: Abnormal   Collection Time    12/05/12 10:18 PM      Result Value Range   TSH 4.943 (*) 0.350 - 4.500 uIU/mL  MAGNESIUM     Status: None   Collection Time    12/05/12 10:18 PM      Result Value Range   Magnesium 1.7  1.5 - 2.5 mg/dL  URINE RAPID DRUG SCREEN (HOSP PERFORMED)     Status: None   Collection Time    12/06/12 12:24 AM  Result Value Range   Opiates NONE DETECTED  NONE DETECTED   Cocaine NONE DETECTED  NONE DETECTED   Benzodiazepines NONE DETECTED  NONE DETECTED   Amphetamines NONE DETECTED  NONE DETECTED   Tetrahydrocannabinol NONE DETECTED  NONE DETECTED   Barbiturates NONE DETECTED  NONE DETECTED   Comment:            DRUG SCREEN FOR MEDICAL PURPOSES     ONLY.  IF CONFIRMATION IS NEEDED     FOR ANY PURPOSE, NOTIFY LAB     WITHIN 5 DAYS.                LOWEST DETECTABLE LIMITS     FOR URINE DRUG SCREEN     Drug Class       Cutoff (ng/mL)     Amphetamine      1000     Barbiturate      200     Benzodiazepine   200     Tricyclics        300     Opiates          300     Cocaine          300     THC              50  GLUCOSE, CAPILLARY     Status: None   Collection Time    12/06/12  9:28 AM      Result Value Range   Glucose-Capillary 86  70 - 99 mg/dL  GLUCOSE, CAPILLARY     Status: Abnormal   Collection Time    12/06/12  1:08 PM      Result Value Range   Glucose-Capillary 113 (*) 70 - 99 mg/dL  GLUCOSE, CAPILLARY     Status: Abnormal   Collection Time    12/06/12  2:12 PM      Result Value Range   Glucose-Capillary 129 (*) 70 - 99 mg/dL  GLUCOSE, CAPILLARY     Status: Abnormal   Collection Time    12/06/12  5:34 PM      Result Value Range   Glucose-Capillary 148 (*) 70 - 99 mg/dL  GLUCOSE, CAPILLARY     Status: None   Collection Time    12/06/12 10:11 PM      Result Value Range   Glucose-Capillary 71  70 - 99 mg/dL  MAGNESIUM     Status: None   Collection Time    12/07/12  5:10 AM      Result Value Range   Magnesium 1.9  1.5 - 2.5 mg/dL  BASIC METABOLIC PANEL     Status: Abnormal   Collection Time    12/07/12  5:10 AM      Result Value Range   Sodium 137  135 - 145 mEq/L   Potassium 3.2 (*) 3.5 - 5.1 mEq/L   Chloride 104  96 - 112 mEq/L   CO2 26  19 - 32 mEq/L   Glucose, Bld 120 (*) 70 - 99 mg/dL   BUN 13  6 - 23 mg/dL   Creatinine, Ser 4.09  0.50 - 1.10 mg/dL   Calcium 8.9  8.4 - 81.1 mg/dL   GFR calc non Af Amer 74 (*) >90 mL/min   GFR calc Af Amer 86 (*) >90 mL/min   Comment:            The eGFR has been calculated     using the CKD EPI equation.  This calculation has not been     validated in all clinical     situations.     eGFR's persistently     <90 mL/min signify     possible Chronic Kidney Disease.  T4, FREE     Status: None   Collection Time    12/07/12  5:10 AM      Result Value Range   Free T4 1.01  0.80 - 1.80 ng/dL  GLUCOSE, CAPILLARY     Status: Abnormal   Collection Time    12/07/12  7:26 AM      Result Value Range   Glucose-Capillary 117 (*) 70 - 99 mg/dL     No results found.  Review of Systems  Skin: Negative.   Neurological: Positive for dizziness and headaches.  Psychiatric/Behavioral: Positive for depression. Negative for suicidal ideas, hallucinations, memory loss and substance abuse. The patient is nervous/anxious and has insomnia.    Blood pressure 107/56, pulse 60, temperature 98.4 F (36.9 C), temperature source Oral, resp. rate 16, height 5\' 9"  (1.753 m), weight 96.4 kg (212 lb 8.4 oz), last menstrual period 11/07/2012, SpO2 100.00%. Physical Exam  Assessment/Plan:  AXIS I depressive disorder, recurrent  Axis II deferred Axis III hypertension, diabetes mellitus  Axis IV mild to moderate Axis V 50-60   Plan At this time does not exhibit any psychosis, paranoia or active suicidal thoughts or homicidal thoughts.  Patient contracts for safety.  She wants to followup outpatient with her psychologist Dr. Langston Masker and primary care physician for medication management.  Recommend to restart her home medication.  Recommend followup treatment.  At this time patient does not require inpatient psychiatric treatment.  I discuss safety plan that anytime having active suicidal thoughts or homicidal thoughts continue to call 911 or go to local emergency room.   ARFEEN,SYED T. 12/07/2012, 11:52 AM

## 2012-12-07 NOTE — Progress Notes (Signed)
Clinical Social Work  Patient was discussed during [employer]. CSW continues to follow and will assist with recommendations once they are provided by psych MD.   Unk Lightning, LCSW [phone]

## 2012-12-07 NOTE — Care Management Note (Signed)
    Page 1 of 1   12/07/2012     2:03:57 PM   CARE MANAGEMENT NOTE 12/07/2012  Patient:  FIFI, SCHINDLER   Account Number:  [id number]  Date Initiated:  12/07/2012  Documentation initiated by:  Lanier Clam  Subjective/Objective Assessment:   ADMITTED W/SUICIDAL IDEATION.     Action/Plan:   FROM HOME.   Anticipated DC Date:  12/07/2012   Anticipated DC Plan:  HOME/SELF CARE      DC Planning Services  CM consult      Choice offered to / List presented to:             Status of service:  Completed, signed off Medicare Important Message given?   (If response is "NO", the following Medicare IM given date fields will be blank) Date Medicare IM given:   Date Additional Medicare IM given:    Discharge Disposition:  HOME/SELF CARE  Per UR Regulation:  Reviewed for med. necessity/level of care/duration of stay  If discussed at Long Length of Stay Meetings, dates discussed:    Comments:  12/07/12 Tiffiany Beadles RN,BSN NCM [phone] PSYCH-NO SUICIDAL THOUGHTS,CONTRACT FOR SAFETY.D/C HOME.

## 2012-12-22 ENCOUNTER — Ambulatory Visit: Payer: Managed Care, Other (non HMO) | Attending: Family Medicine | Admitting: Physical Therapy

## 2012-12-22 DIAGNOSIS — IMO0001 Reserved for inherently not codable concepts without codable children: Secondary | ICD-10-CM | POA: Insufficient documentation

## 2012-12-22 DIAGNOSIS — M2420 Disorder of ligament, unspecified site: Secondary | ICD-10-CM | POA: Insufficient documentation

## 2012-12-22 DIAGNOSIS — M629 Disorder of muscle, unspecified: Secondary | ICD-10-CM | POA: Insufficient documentation

## 2013-06-21 DIAGNOSIS — E559 Vitamin D deficiency, unspecified: Secondary | ICD-10-CM | POA: Insufficient documentation

## 2013-06-22 ENCOUNTER — Encounter: Payer: Self-pay | Admitting: Obstetrics

## 2013-06-23 ENCOUNTER — Encounter: Payer: Self-pay | Admitting: Obstetrics & Gynecology

## 2013-07-05 ENCOUNTER — Ambulatory Visit: Payer: Self-pay | Admitting: Advanced Practice Midwife

## 2013-07-22 ENCOUNTER — Encounter: Payer: Self-pay | Admitting: Advanced Practice Midwife

## 2013-07-22 ENCOUNTER — Ambulatory Visit (INDEPENDENT_AMBULATORY_CARE_PROVIDER_SITE_OTHER): Payer: BC Managed Care – PPO | Admitting: Advanced Practice Midwife

## 2013-07-22 VITALS — BP 111/79 | HR 60 | Temp 98.3°F | Ht 68.0 in | Wt 204.0 lb

## 2013-07-22 DIAGNOSIS — B379 Candidiasis, unspecified: Secondary | ICD-10-CM

## 2013-07-22 DIAGNOSIS — N760 Acute vaginitis: Secondary | ICD-10-CM

## 2013-07-22 DIAGNOSIS — D259 Leiomyoma of uterus, unspecified: Secondary | ICD-10-CM

## 2013-07-22 DIAGNOSIS — N946 Dysmenorrhea, unspecified: Secondary | ICD-10-CM

## 2013-07-22 DIAGNOSIS — Z124 Encounter for screening for malignant neoplasm of cervix: Secondary | ICD-10-CM

## 2013-07-22 MED ORDER — FLUCONAZOLE 150 MG PO TABS: 150.0000 mg | ORAL_TABLET | Freq: Once | ORAL | Status: DC

## 2013-07-22 NOTE — Progress Notes (Addendum)
Subjective:     Hayley Bryant is a 43 y.o. female here for a routine exam.  Current complaints: Patient is in the office today for a problem visit. Patient states she has heavy bleeding during her cycles with a lot of small clots. Patient states she has a lot of cramping with her cycles. Patient states that the pain is so bad for her sometimes that it is hard for her to function. Patient states sometimes it does causes nausea but no vomiting. Patient states she was diagnosed with fibroids.   Personal health questionnaire reviewed: yes.  Patient taking 800 mg of ibuprofen every 3-4 hours for 5 days. States she is in pain for all 5 days of her MP. States the cramps are 8/10 in pain.   Gynecologic History Patient's last menstrual period was 07/08/2013. Contraception: none Last Pap: 2013. Results were: normal Last mammogram: 06/2013. Results were: normal  Obstetric History OB History  Gravida Para Term Preterm AB SAB TAB Ectopic Multiple Living  5 3 3  2 1 1   3     # Outcome Date GA Lbr Len/2nd Weight Sex Delivery Anes PTL Lv  5 TRM 01/02/99 [redacted]w[redacted]d  6 lb 3 oz (2.807 kg) F SVD EPI  Y  4 SAB 1999        N  3 TRM 11/03/96 [redacted]w[redacted]d  6 lb 4 oz (2.835 kg) M SVD EPI  Y  2 TAB 1998        N  1 TRM 01/26/93 [redacted]w[redacted]d  5 lb 14.5 oz (2.679 kg) M SVD EPI  Y       The following portions of the patient's history were reviewed and updated as appropriate: allergies, current medications, past family history, past medical history, past social history, past surgical history and problem list.  Review of Systems A comprehensive review of systems was negative except for: Constitutional: positive for denies Genitourinary: positive for yeast infection Neurological: positive for depression    Objective:    BP 111/79  Pulse 60  Temp(Src) 98.3 F (36.8 C)  Ht 5\' 8"  (1.727 m)  Wt 204 lb (92.534 kg)  BMI 31.03 kg/m2  LMP 07/08/2013  General Appearance:    Alert, cooperative, no distress, appears stated age   Head:    Normocephalic, without obvious abnormality, atraumatic  Eyes:    PERRL, conjunctiva/corneas clear, EOM's intact, fundi    benign, both eyes  Ears:    Normal TM's and external ear canals, both ears  Nose:   Nares normal, septum midline, mucosa normal, no drainage    or sinus tenderness  Throat:   Lips, mucosa, and tongue normal; teeth and gums normal  Neck:   Supple, symmetrical, trachea midline, no adenopathy;    thyroid:  no enlargement/tenderness/nodules; no carotid   bruit or JVD  Back:     Symmetric, no curvature, ROM normal, no CVA tenderness  Lungs:     Clear to auscultation bilaterally, respirations unlabored  Chest Wall:    No tenderness or deformity   Heart:    Regular rate and rhythm, S1 and S2 normal, no murmur, rub   or gallop  Breast Exam:    No tenderness, masses, or nipple abnormality  Abdomen:     Soft, non-tender, bowel sounds active all four quadrants,    no masses, no organomegaly  Genitalia:    Normal female without lesion, discharge or tenderness  Rectal:    Normal tone, normal prostate, no masses or tenderness;   guaiac  negative stool  Extremities:   Extremities normal, atraumatic, no cyanosis or edema  Pulses:   2+ and symmetric all extremities  Skin:   Skin color, texture, turgor normal, no rashes or lesions  Lymph nodes:   Cervical, supraclavicular, and axillary nodes normal  Neurologic:   CNII-XII intact, normal strength, sensation and reflexes    throughout    Wet Prep: +yeast, all else negative  Assessment:   Dysmenorrhea Yeast infection History of Depression Patient Active Problem List   Diagnosis Date Noted  . Suicidal overdose 12/06/2012  . HTN (hypertension) 12/06/2012  . DM (diabetes mellitus) 12/06/2012   History of uterine fibroid  Plan:    Patient has resources and has been given referral for psych evaluation and treatment.  Patient strongly encouraged to make appt.  Pelvic US, plan pending results    Discussed possible  options for treatment, including possible IUD. Yeast infection today, diflucan Rx sent to pharmacy. Discussed OTC treatment for dysmenorrhea. Encouraged patient to not take excessive amts of Ibuprofen. Encouraged ibuprofen, tylenol and hot pad.   30 min spent with patient greater than 80% spent in counseling and coordination of care.    Valon Glasscock Roni Bread CNM

## 2013-07-23 LAB — WET PREP BY MOLECULAR PROBE
CANDIDA SPECIES: NEGATIVE
Gardnerella vaginalis: NEGATIVE
Trichomonas vaginosis: NEGATIVE

## 2013-07-25 LAB — PAP IG W/ RFLX HPV ASCU

## 2013-07-28 ENCOUNTER — Ambulatory Visit (HOSPITAL_COMMUNITY)
Admission: RE | Admit: 2013-07-28 | Discharge: 2013-07-28 | Disposition: A | Discharge status: Home / Self Care | Payer: BC Managed Care – PPO | Source: Ambulatory Visit | Attending: Advanced Practice Midwife | Admitting: Advanced Practice Midwife

## 2013-07-28 DIAGNOSIS — N946 Dysmenorrhea, unspecified: Secondary | ICD-10-CM | POA: Insufficient documentation

## 2013-07-28 DIAGNOSIS — D259 Leiomyoma of uterus, unspecified: Secondary | ICD-10-CM | POA: Insufficient documentation

## 2013-09-20 DIAGNOSIS — G479 Sleep disorder, unspecified: Secondary | ICD-10-CM | POA: Insufficient documentation

## 2013-09-20 DIAGNOSIS — E663 Overweight: Secondary | ICD-10-CM | POA: Insufficient documentation

## 2013-10-11 ENCOUNTER — Ambulatory Visit: Payer: BC Managed Care – PPO | Admitting: Advanced Practice Midwife

## 2013-10-18 ENCOUNTER — Ambulatory Visit: Payer: BC Managed Care – PPO | Admitting: Advanced Practice Midwife

## 2014-03-23 ENCOUNTER — Encounter (HOSPITAL_COMMUNITY): Payer: Self-pay | Admitting: Emergency Medicine

## 2014-03-23 ENCOUNTER — Emergency Department (HOSPITAL_COMMUNITY)
Admission: EM | Admit: 2014-03-23 | Discharge: 2014-03-23 | Disposition: A | Discharge status: Home / Self Care | Payer: BC Managed Care – PPO | Attending: Emergency Medicine | Admitting: Emergency Medicine

## 2014-03-23 DIAGNOSIS — S3992XA Unspecified injury of lower back, initial encounter: Secondary | ICD-10-CM | POA: Insufficient documentation

## 2014-03-23 DIAGNOSIS — Y92410 Unspecified street and highway as the place of occurrence of the external cause: Secondary | ICD-10-CM | POA: Diagnosis not present

## 2014-03-23 DIAGNOSIS — Z79899 Other long term (current) drug therapy: Secondary | ICD-10-CM | POA: Insufficient documentation

## 2014-03-23 DIAGNOSIS — Z7982 Long term (current) use of aspirin: Secondary | ICD-10-CM | POA: Diagnosis not present

## 2014-03-23 DIAGNOSIS — E119 Type 2 diabetes mellitus without complications: Secondary | ICD-10-CM | POA: Insufficient documentation

## 2014-03-23 DIAGNOSIS — Y9389 Activity, other specified: Secondary | ICD-10-CM | POA: Insufficient documentation

## 2014-03-23 DIAGNOSIS — S199XXA Unspecified injury of neck, initial encounter: Secondary | ICD-10-CM | POA: Diagnosis present

## 2014-03-23 DIAGNOSIS — Z794 Long term (current) use of insulin: Secondary | ICD-10-CM | POA: Diagnosis not present

## 2014-03-23 DIAGNOSIS — I10 Essential (primary) hypertension: Secondary | ICD-10-CM | POA: Insufficient documentation

## 2014-03-23 DIAGNOSIS — S161XXA Strain of muscle, fascia and tendon at neck level, initial encounter: Secondary | ICD-10-CM | POA: Insufficient documentation

## 2014-03-23 MED ORDER — HYDROCODONE-ACETAMINOPHEN 5-325 MG PO TABS: 1.0000 | ORAL_TABLET | Freq: Four times a day (QID) | ORAL | Status: DC | PRN
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
## 2014-03-23 NOTE — ED Provider Notes (Signed)
Medical screening examination/treatment/procedure(s) were performed by non-physician practitioner and as supervising physician I was immediately available for consultation/collaboration.   EKG Interpretation None        Debby Freiberg, MD 03/23/14 1101

## 2014-03-23 NOTE — Discharge Instructions (Signed)
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
## 2014-04-03 ENCOUNTER — Encounter (HOSPITAL_COMMUNITY): Payer: Self-pay | Admitting: Emergency Medicine

## 2014-05-29 ENCOUNTER — Encounter: Payer: Self-pay | Admitting: *Deleted

## 2015-02-25 ENCOUNTER — Emergency Department (HOSPITAL_COMMUNITY): Payer: BC Managed Care – PPO

## 2015-02-25 ENCOUNTER — Encounter (HOSPITAL_COMMUNITY): Payer: Self-pay

## 2015-02-25 ENCOUNTER — Emergency Department (HOSPITAL_COMMUNITY)
Admission: EM | Admit: 2015-02-25 | Discharge: 2015-02-25 | Disposition: A | Discharge status: Home / Self Care | Payer: BC Managed Care – PPO | Attending: Emergency Medicine | Admitting: Emergency Medicine

## 2015-02-25 DIAGNOSIS — Z3202 Encounter for pregnancy test, result negative: Secondary | ICD-10-CM | POA: Insufficient documentation

## 2015-02-25 DIAGNOSIS — Z794 Long term (current) use of insulin: Secondary | ICD-10-CM | POA: Insufficient documentation

## 2015-02-25 DIAGNOSIS — M549 Dorsalgia, unspecified: Secondary | ICD-10-CM | POA: Diagnosis not present

## 2015-02-25 DIAGNOSIS — Z79899 Other long term (current) drug therapy: Secondary | ICD-10-CM | POA: Insufficient documentation

## 2015-02-25 DIAGNOSIS — Z793 Long term (current) use of hormonal contraceptives: Secondary | ICD-10-CM | POA: Insufficient documentation

## 2015-02-25 DIAGNOSIS — R1031 Right lower quadrant pain: Secondary | ICD-10-CM | POA: Diagnosis not present

## 2015-02-25 DIAGNOSIS — E119 Type 2 diabetes mellitus without complications: Secondary | ICD-10-CM | POA: Insufficient documentation

## 2015-02-25 DIAGNOSIS — Z7982 Long term (current) use of aspirin: Secondary | ICD-10-CM | POA: Insufficient documentation

## 2015-02-25 DIAGNOSIS — I10 Essential (primary) hypertension: Secondary | ICD-10-CM | POA: Diagnosis not present

## 2015-02-25 DIAGNOSIS — R300 Dysuria: Secondary | ICD-10-CM | POA: Diagnosis present

## 2015-02-25 DIAGNOSIS — R110 Nausea: Secondary | ICD-10-CM | POA: Insufficient documentation

## 2015-02-25 LAB — CBC WITH DIFFERENTIAL/PLATELET
BASOS ABS: 0 10*3/uL (ref 0.0–0.1)
BASOS PCT: 0 %
EOS ABS: 0.1 10*3/uL (ref 0.0–0.7)
EOS PCT: 1 %
HCT: 35.7 % — ABNORMAL LOW (ref 36.0–46.0)
Hemoglobin: 11.7 g/dL — ABNORMAL LOW (ref 12.0–15.0)
LYMPHS PCT: 24 %
Lymphs Abs: 1.7 10*3/uL (ref 0.7–4.0)
MCH: 29.5 pg (ref 26.0–34.0)
MCHC: 32.8 g/dL (ref 30.0–36.0)
MCV: 89.9 fL (ref 78.0–100.0)
MONO ABS: 0.3 10*3/uL (ref 0.1–1.0)
Monocytes Relative: 4 %
Neutro Abs: 5.1 10*3/uL (ref 1.7–7.7)
Neutrophils Relative %: 71 %
PLATELETS: 279 10*3/uL (ref 150–400)
RBC: 3.97 MIL/uL (ref 3.87–5.11)
RDW: 12.4 % (ref 11.5–15.5)
WBC: 7.2 10*3/uL (ref 4.0–10.5)

## 2015-02-25 LAB — BASIC METABOLIC PANEL
Anion gap: 7 (ref 5–15)
BUN: 7 mg/dL (ref 6–20)
CALCIUM: 9 mg/dL (ref 8.9–10.3)
CO2: 28 mmol/L (ref 22–32)
Chloride: 101 mmol/L (ref 101–111)
Creatinine, Ser: 0.63 mg/dL (ref 0.44–1.00)
GFR calc Af Amer: >60 mL/min (ref >60)
GLUCOSE: 186 mg/dL — AB (ref 65–99)
POTASSIUM: 4.3 mmol/L (ref 3.5–5.1)
SODIUM: 136 mmol/L (ref 135–145)

## 2015-02-25 LAB — URINALYSIS, ROUTINE W REFLEX MICROSCOPIC
BILIRUBIN URINE: NEGATIVE
Glucose, UA: 500 mg/dL — AB
Hgb urine dipstick: NEGATIVE
KETONES UR: NEGATIVE mg/dL
LEUKOCYTES UA: NEGATIVE
NITRITE: NEGATIVE
PH: 6.5 (ref 5.0–8.0)
PROTEIN: NEGATIVE mg/dL
Specific Gravity, Urine: 1.026 (ref 1.005–1.030)
UROBILINOGEN UA: 1 mg/dL (ref 0.0–1.0)

## 2015-02-25 LAB — POC URINE PREG, ED: PREG TEST UR: NEGATIVE

## 2015-02-25 MED ORDER — PHENAZOPYRIDINE HCL 200 MG PO TABS: 200.0000 mg | ORAL_TABLET | Freq: Three times a day (TID) | ORAL | Status: DC

## 2015-02-25 MED ORDER — NITROFURANTOIN MONOHYD MACRO 100 MG PO CAPS: 100.0000 mg | ORAL_CAPSULE | Freq: Two times a day (BID) | ORAL | Status: DC

## 2015-02-25 MED ORDER — IBUPROFEN 800 MG PO TABS
800.0000 mg | ORAL_TABLET | Freq: Once | ORAL | Status: AC
  Administered 2015-02-25: 800 mg via ORAL
  Filled 2015-02-25: qty 1

## 2015-02-25 NOTE — ED Notes (Signed)
Pt. Presents with complaint of lower abd pain and urinary symptoms (burning/pain with urination). Pt. States she took azo and drank cranberry juice with no improvement.

## 2015-02-25 NOTE — ED Notes (Signed)
Patient transported to CT 

## 2015-02-25 NOTE — Discharge Instructions (Signed)
Abdominal Pain Many things can cause abdominal pain. Usually, abdominal pain is not caused by a disease and will improve without treatment. It can often be observed and treated at home. Your health care provider will do a physical exam and possibly order blood tests and X-rays to help determine the seriousness of your pain. However, in many cases, more time must pass before a clear cause of the pain can be found. Before that point, your health care provider may not know if you need more testing or further treatment. HOME CARE INSTRUCTIONS  Monitor your abdominal pain for any changes. The following actions may help to alleviate any discomfort you are experiencing:  Only take over-the-counter or prescription medicines as directed by your health care provider.  Do not take laxatives unless directed to do so by your health care provider.  Try a clear liquid diet (broth, tea, or water) as directed by your health care provider. Slowly move to a bland diet as tolerated. SEEK MEDICAL CARE IF:  You have unexplained abdominal pain.  You have abdominal pain associated with nausea or diarrhea.  You have pain when you urinate or have a bowel movement.  You experience abdominal pain that wakes you in the night.  You have abdominal pain that is worsened or improved by eating food.  You have abdominal pain that is worsened with eating fatty foods.  You have a fever. SEEK IMMEDIATE MEDICAL CARE IF:   Your pain does not go away within 2 hours.  You keep throwing up (vomiting).  Your pain is felt only in portions of the abdomen, such as the right side or the left lower portion of the abdomen.  You pass bloody or black tarry stools. MAKE SURE YOU:  Understand these instructions.   Will watch your condition.   Will get help right away if you are not doing well or get worse.  Document Released: 02/26/2005 Document Revised: 05/24/2013 Document Reviewed: 01/26/2013 [facility] Patient Information  2015 [place], [place]. This information is not intended to replace advice given to you by your health care provider. Make sure you discuss any questions you have with your health care provider.     Dysuria Dysuria is the medical term for pain with urination. There are many causes for dysuria, but urinary tract infection is the most common. If a urinalysis was performed it can show that there is a urinary tract infection. A urine culture confirms that you or your child is sick. You will need to follow up with a healthcare provider because:  If a urine culture was done you will need to know the culture results and treatment recommendations.  If the urine culture was positive, you or your child will need to be put on antibiotics or know if the antibiotics prescribed are the right antibiotics for your urinary tract infection.  If the urine culture is negative (no urinary tract infection), then other causes may need to be explored or antibiotics need to be stopped. Today laboratory work may have been done and there does not seem to be an infection. If cultures were done they will take at least 24 to 48 hours to be completed. Today x-rays may have been taken and they read as normal. No cause can be found for the problems. The x-rays may be re-read by a radiologist and you will be contacted if additional findings are made. You or your child may have been put on medications to help with this problem until you can see your  primary caregiver. If the problems get better, see your primary caregiver if the problems return. If you were given antibiotics (medications which kill germs), take all of the mediations as directed for the full course of treatment.  If laboratory work was done, you need to find the results. Leave a telephone number where you can be reached. If this is not possible, make sure you find out how you are to get test results. HOME CARE INSTRUCTIONS   Drink lots of fluids. For adults, drink  eight, 8 ounce glasses of clear juice or water a day. For children, replace fluids as suggested by your caregiver.  Empty the bladder often. Avoid holding urine for long periods of time.  After a bowel movement, women should cleanse front to back, using each tissue only once.  Empty your bladder before and after sexual intercourse.  Take all the medicine given to you until it is gone. You may feel better in a few days, but TAKE ALL MEDICINE.  Avoid caffeine, tea, alcohol and carbonated beverages, because they tend to irritate the bladder.  In men, alcohol may irritate the prostate.  Only take over-the-counter or prescription medicines for pain, discomfort, or fever as directed by your caregiver.  If your caregiver has given you a follow-up appointment, it is very important to keep that appointment. Not keeping the appointment could result in a chronic or permanent injury, pain, and disability. If there is any problem keeping the appointment, you must call back to this facility for assistance. SEEK IMMEDIATE MEDICAL CARE IF:   Back pain develops.  A fever develops.  There is nausea (feeling sick to your stomach) or vomiting (throwing up).  Problems are no better with medications or are getting worse. MAKE SURE YOU:   Understand these instructions.  Will watch your condition.  Will get help right away if you are not doing well or get worse. Document Released: 02/15/2004 Document Revised: 08/11/2011 Document Reviewed: 12/23/2007 [facility] Patient Information 2015 [place], [place]. This information is not intended to replace advice given to you by your health care provider. Make sure you discuss any questions you have with your health care provider.

## 2015-02-25 NOTE — ED Provider Notes (Signed)
CSN: [id number]     Arrival date & time 02/25/15  6269 History   First MD Initiated Contact with Patient 02/25/15 [phone]     Chief Complaint  Patient presents with  . Dysuria     (Consider location/radiation/quality/duration/timing/severity/associated sxs/prior Treatment) HPI  44 year old female presents with dysuria for the last 2 days. Patient states she tried Azo and cranberry juice but has not had any improvement. This morning she developed some right flank pain and transient nausea that has now resolved. Patient scrubbed herself as a poorly controlled diabetic. She chronically has a yeast infection but denies any new or different vaginal discharge. She just recently got off her last menstrual cycle. Patient has not noted any fevers. Does have suprapubic pain. Denies hematuria.  Past Medical History  Diagnosis Date  . Hypertension   . Diabetes mellitus    Past Surgical History  Procedure Laterality Date  . Cholecystectomy     Family History  Problem Relation Age of Onset  . Hypertension Mother    Social History  Substance Use Topics  . Smoking status: Never Smoker   . Smokeless tobacco: Never Used  . Alcohol Use: No   OB History    Gravida Para Term Preterm AB TAB SAB Ectopic Multiple Living   5 3 3  2 1 1   3      Review of Systems  Constitutional: Negative for fever.  Gastrointestinal: Positive for nausea and abdominal pain. Negative for vomiting.  Genitourinary: Positive for dysuria. Negative for frequency ("I pee all the time anyway") and decreased urine volume.  Musculoskeletal: Positive for back pain.  All other systems reviewed and are negative.     Allergies  Metformin and related  Home Medications   Prior to Admission medications   Medication Sig Start Date End Date Taking? Authorizing Provider  aspirin EC 81 MG tablet Take 81 mg by mouth daily.      Historical Provider, MD  fluconazole (DIFLUCAN) 150 MG tablet Take 1 tablet (150 mg total) by mouth  once. 07/22/13   Amy H Wren, CNM  glipiZIDE (GLUCOTROL) 10 MG tablet Take 10 mg by mouth daily before breakfast.    Historical Provider, MD  HYDROcodone-acetaminophen (NORCO/VICODIN) 5-325 MG per tablet Take 1-2 tablets by mouth every 6 (six) hours as needed. 03/23/14   Montine Circle, PA-C  insulin aspart protamine-insulin aspart (NOVOLOG 70/30) (70-30) 100 UNIT/ML injection Inject 15-18 Units into the skin 2 (two) times daily with a meal. 15 units in the morning and 18 units in the evening    Historical Provider, MD  lisinopril-hydrochlorothiazide (PRINZIDE,ZESTORETIC) 20-25 MG per tablet Take 1 tablet by mouth daily. HOLD UNTIL FOLLOW UP WITH PRIMARY CARE DOCTOR 12/07/12   Barton Dubois, MD  meclizine (ANTIVERT) 25 MG tablet Take 1 tablet (25 mg total) by mouth 3 (three) times daily as needed for dizziness. 10/15/12   Julianne Rice, MD  norethindrone (MICRONOR,CAMILA,ERRIN) 0.35 MG tablet Take 1 tablet by mouth daily.    Historical Provider, MD  ondansetron (ZOFRAN ODT) 4 MG disintegrating tablet 4mg  ODT q4 hours prn nausea/vomit 10/15/12   Julianne Rice, MD  venlafaxine XR (EFFEXOR-XR) 75 MG 24 hr capsule Take 75 mg by mouth every morning.    Historical Provider, MD   BP 124/82 mmHg  Pulse 51  Temp(Src) 98.3 F (36.8 C) (Oral)  Ht 5\' 9"  (1.753 m)  Wt 200 lb (90.719 kg)  BMI 29.52 kg/m2  SpO2 100%  LMP 02/19/2015 Physical Exam  Constitutional: She is oriented  to person, place, and time. She appears well-developed and well-nourished.  HENT:  Head: Normocephalic and atraumatic.  Right Ear: External ear normal.  Left Ear: External ear normal.  Nose: Nose normal.  Eyes: Right eye exhibits no discharge. Left eye exhibits no discharge.  Cardiovascular: Normal rate, regular rhythm and normal heart sounds.   Pulmonary/Chest: Effort normal and breath sounds normal.  Abdominal: Soft. There is tenderness in the suprapubic area. There is CVA tenderness (mild, right sided).  Neurological: She is  alert and oriented to person, place, and time.  Skin: Skin is warm and dry.  Nursing note and vitals reviewed.   ED Course  Procedures (including critical care time) Labs Review Labs Reviewed  BASIC METABOLIC PANEL - Abnormal; Notable for the following:    Glucose, Bld 186 (*)    All other components within normal limits  CBC WITH DIFFERENTIAL/PLATELET - Abnormal; Notable for the following:    Hemoglobin 11.7 (*)    HCT 35.7 (*)    All other components within normal limits  URINALYSIS, ROUTINE W REFLEX MICROSCOPIC (NOT AT [facility]) - Abnormal; Notable for the following:    Glucose, UA 500 (*)    All other components within normal limits  URINE CULTURE  URINE CULTURE  POC URINE PREG, ED    Imaging Review Ct Renal Stone Study  02/25/2015   CLINICAL DATA:  Lower abdominal/right flank pain, status post cholecystectomy  EXAM: CT ABDOMEN AND PELVIS WITHOUT CONTRAST  TECHNIQUE: Multidetector CT imaging of the abdomen and pelvis was performed following the standard protocol without IV contrast.  COMPARISON:  03/15/2010  FINDINGS: Lower chest:  Lung bases are clear.  Hepatobiliary: Unenhanced liver is unremarkable.  Status post cholecystectomy. No intrahepatic or extrahepatic ductal dilatation.  Pancreas: Within normal limits.  Spleen: Within normal limits.  Adrenals/Urinary Tract: Adrenal glands are within normal limits.  12 mm lateral interpolar left renal cyst (series 2/ image 33). 4 mm nonobstructing left lower pole renal calculus (series 2/ image 41).  Right kidney is within normal limits.  No hydronephrosis.  No ureteral or bladder calculi.  Bladder is mildly thick-walled although underdistended.  Stomach/Bowel: Stomach is within normal limits.  No evidence of bowel obstruction.  Appendix is notable for a tiny distal appendicolith but no associated inflammatory changes (series 5/ image 39).  Vascular/Lymphatic: No evidence of abdominal aortic aneurysm.  No suspicious abdominopelvic  lymphadenopathy.  Reproductive: Heterogeneous uterus with 4.8 cm subserosal right fundal fibroid (series 2/image 73).  Bilateral ovaries are within normal limits.  Other: Trace right pelvic ascites.  Musculoskeletal: Visualized osseous structures are within normal limits.  IMPRESSION: 4 mm nonobstructing left lower pole renal calculus. No ureteral or bladder calculi. No hydronephrosis.  Bladder is mildly thick-walled although underdistended.  No evidence of bowel obstruction or appendicitis.  Status post cholecystectomy.  Uterine fibroids.   Electronically Signed   By: Julian Hy M.D.   On: 02/25/2015 11:21   I have personally reviewed and evaluated these images and lab results as part of my medical decision-making.   EKG Interpretation None      MDM   Final diagnoses:  Dysuria    It is unclear exactly why the patient is having dysuria. Her symptoms are most consistent with a UTI except that her urine has no leukocytes or nitrites. This could be a false negative however given her symptoms. She has symptoms like a UTI versus early Pyelo. No evidence of an obstructing ureteral stone. This discussed the possibility of urethral os  as a cause of burning with urination but patient is adamant that she does not have an STD and has 1 partner, her husband. She does not want pelvic examination. Will treat with Macrobid and Pyridium and if symptoms do not improve in the next couple days I recommended she follow-up closely with her PCP.    Sherwood Gambler, MD 02/25/15 1131

## 2015-02-25 NOTE — ED Notes (Signed)
Patient returned from CT, monitor reconnected.

## 2015-02-26 LAB — URINE CULTURE

## 2015-04-08 ENCOUNTER — Encounter (HOSPITAL_COMMUNITY): Payer: Self-pay | Admitting: *Deleted

## 2015-04-08 ENCOUNTER — Emergency Department (HOSPITAL_COMMUNITY): Payer: BC Managed Care – PPO

## 2015-04-08 ENCOUNTER — Emergency Department (HOSPITAL_COMMUNITY)
Admission: EM | Admit: 2015-04-08 | Discharge: 2015-04-08 | Disposition: A | Discharge status: Home / Self Care | Payer: BC Managed Care – PPO | Attending: Emergency Medicine | Admitting: Emergency Medicine

## 2015-04-08 DIAGNOSIS — R0602 Shortness of breath: Secondary | ICD-10-CM | POA: Insufficient documentation

## 2015-04-08 DIAGNOSIS — I10 Essential (primary) hypertension: Secondary | ICD-10-CM | POA: Diagnosis not present

## 2015-04-08 DIAGNOSIS — Z79899 Other long term (current) drug therapy: Secondary | ICD-10-CM | POA: Diagnosis not present

## 2015-04-08 DIAGNOSIS — R51 Headache: Secondary | ICD-10-CM | POA: Insufficient documentation

## 2015-04-08 DIAGNOSIS — R42 Dizziness and giddiness: Secondary | ICD-10-CM | POA: Insufficient documentation

## 2015-04-08 DIAGNOSIS — F419 Anxiety disorder, unspecified: Secondary | ICD-10-CM | POA: Diagnosis not present

## 2015-04-08 DIAGNOSIS — E860 Dehydration: Secondary | ICD-10-CM | POA: Diagnosis not present

## 2015-04-08 DIAGNOSIS — R630 Anorexia: Secondary | ICD-10-CM | POA: Insufficient documentation

## 2015-04-08 DIAGNOSIS — R519 Headache, unspecified: Secondary | ICD-10-CM

## 2015-04-08 DIAGNOSIS — R110 Nausea: Secondary | ICD-10-CM | POA: Insufficient documentation

## 2015-04-08 DIAGNOSIS — R008 Other abnormalities of heart beat: Secondary | ICD-10-CM | POA: Diagnosis present

## 2015-04-08 DIAGNOSIS — E119 Type 2 diabetes mellitus without complications: Secondary | ICD-10-CM | POA: Insufficient documentation

## 2015-04-08 DIAGNOSIS — R55 Syncope and collapse: Secondary | ICD-10-CM | POA: Diagnosis not present

## 2015-04-08 DIAGNOSIS — Z7982 Long term (current) use of aspirin: Secondary | ICD-10-CM | POA: Insufficient documentation

## 2015-04-08 DIAGNOSIS — R002 Palpitations: Secondary | ICD-10-CM | POA: Diagnosis not present

## 2015-04-08 LAB — BASIC METABOLIC PANEL
ANION GAP: 9 (ref 5–15)
BUN: 11 mg/dL (ref 6–20)
CHLORIDE: 102 mmol/L (ref 101–111)
CO2: 25 mmol/L (ref 22–32)
CREATININE: 0.78 mg/dL (ref 0.44–1.00)
Calcium: 9.2 mg/dL (ref 8.9–10.3)
GFR calc non Af Amer: >60 mL/min (ref >60)
Glucose, Bld: 205 mg/dL — ABNORMAL HIGH (ref 65–99)
POTASSIUM: 3.5 mmol/L (ref 3.5–5.1)
SODIUM: 136 mmol/L (ref 135–145)

## 2015-04-08 LAB — CBC
HCT: 36.7 % (ref 36.0–46.0)
HEMOGLOBIN: 12.1 g/dL (ref 12.0–15.0)
MCH: 29.2 pg (ref 26.0–34.0)
MCHC: 33 g/dL (ref 30.0–36.0)
MCV: 88.4 fL (ref 78.0–100.0)
PLATELETS: 341 10*3/uL (ref 150–400)
RBC: 4.15 MIL/uL (ref 3.87–5.11)
RDW: 13.7 % (ref 11.5–15.5)
WBC: 9.4 10*3/uL (ref 4.0–10.5)

## 2015-04-08 LAB — URINALYSIS, ROUTINE W REFLEX MICROSCOPIC
Glucose, UA: 500 mg/dL — AB
Hgb urine dipstick: NEGATIVE
KETONES UR: NEGATIVE mg/dL
LEUKOCYTES UA: NEGATIVE
NITRITE: NEGATIVE
PH: 6.5 (ref 5.0–8.0)
PROTEIN: NEGATIVE mg/dL
Specific Gravity, Urine: 1.034 — ABNORMAL HIGH (ref 1.005–1.030)
Urobilinogen, UA: 1 mg/dL (ref 0.0–1.0)

## 2015-04-08 LAB — I-STAT TROPONIN, ED: Troponin i, poc: 0 ng/mL (ref 0.00–0.08)

## 2015-04-08 LAB — CBG MONITORING, ED: Glucose-Capillary: 209 mg/dL — ABNORMAL HIGH (ref 65–99)

## 2015-04-08 LAB — POC URINE PREG, ED: PREG TEST UR: NEGATIVE

## 2015-04-08 MED ORDER — SODIUM CHLORIDE 0.9 % IV BOLUS (SEPSIS)
1000.0000 mL | Freq: Once | INTRAVENOUS | Status: AC
  Administered 2015-04-08: 1000 mL via INTRAVENOUS

## 2015-04-08 MED ORDER — DIPHENHYDRAMINE HCL 50 MG/ML IJ SOLN
25.0000 mg | Freq: Once | INTRAMUSCULAR | Status: AC
  Administered 2015-04-08: 25 mg via INTRAVENOUS
  Filled 2015-04-08: qty 1

## 2015-04-08 MED ORDER — METOCLOPRAMIDE HCL 5 MG/ML IJ SOLN
10.0000 mg | Freq: Once | INTRAMUSCULAR | Status: AC
  Administered 2015-04-08: 10 mg via INTRAVENOUS
  Filled 2015-04-08: qty 2

## 2015-04-08 MED ORDER — ONDANSETRON HCL 4 MG/2ML IJ SOLN
4.0000 mg | Freq: Once | INTRAMUSCULAR | Status: AC
  Administered 2015-04-08: 4 mg via INTRAVENOUS
  Filled 2015-04-08: qty 2

## 2015-04-08 NOTE — ED Notes (Signed)
Pt reports heart flutter onset today x 1 hr ago, similar symptoms while taking Metformin in the past, pt reports feeling anxious, denies CP & SOB, pt c./o nausea, denies v/d, pt A&O x4, follows commands, speaks in complete sentences

## 2015-04-08 NOTE — ED Notes (Signed)
Pt. Left with all belongings and refused wheelchair. Discharge instructions were reviewed and all questions were answered.  

## 2015-04-08 NOTE — ED Provider Notes (Signed)
CSN: [id number]     Arrival date & time 04/08/15  1850 History   First MD Initiated Contact with Patient 04/08/15 1911     Chief Complaint  Patient presents with  . Irregular Heart Beat  . Dizziness     (Consider location/radiation/quality/duration/timing/severity/associated sxs/prior Treatment) Patient is a 44 y.o. female presenting with palpitations. The history is provided by the patient and medical records.  Palpitations Palpitations quality:  Fast Onset quality:  At rest Duration:  30 minutes Timing:  Rare Progression:  Resolved Chronicity:  New Context: caffeine and dehydration   Context: not anxiety, not appetite suppressants, not blood loss, not exercise, not hyperventilation, not illicit drugs, not nicotine and not stimulant use   Relieved by:  Nothing Worsened by:  Stress and caffeine Ineffective treatments:  Bed rest Associated symptoms: dizziness, nausea, near-syncope and shortness of breath   Associated symptoms: no back pain, no chest pain, no chest pressure, no cough, no diaphoresis, no leg pain, no lower extremity edema, no malaise/fatigue, no numbness, no orthopnea, no PND, no syncope, no vomiting and no weakness   Risk factors: diabetes mellitus and stress   Risk factors: no hx of DVT, no hx of PE, no hx of thyroid disease, no hyperthyroidism and no OTC sinus medications     Past Medical History  Diagnosis Date  . Hypertension   . Diabetes mellitus    Past Surgical History  Procedure Laterality Date  . Cholecystectomy     Family History  Problem Relation Age of Onset  . Hypertension Mother    Social History  Substance Use Topics  . Smoking status: Never Smoker   . Smokeless tobacco: Never Used  . Alcohol Use: No   OB History    Gravida Para Term Preterm AB TAB SAB Ectopic Multiple Living   5 3 3  2 1 1   3      Review of Systems  Constitutional: Negative for fever, malaise/fatigue and diaphoresis.  HENT: Negative for facial swelling.    Respiratory: Positive for shortness of breath. Negative for cough.   Cardiovascular: Positive for palpitations and near-syncope. Negative for chest pain, orthopnea, syncope and PND.  Gastrointestinal: Positive for nausea. Negative for vomiting and abdominal pain.  Genitourinary: Negative for dysuria.  Musculoskeletal: Negative for back pain.  Skin: Negative for rash.  Neurological: Positive for dizziness, light-headedness and headaches. Negative for tremors, seizures, syncope, facial asymmetry, speech difficulty, weakness and numbness.  Psychiatric/Behavioral: Negative for confusion. The patient is nervous/anxious.       Allergies  Metformin and related  Home Medications   Prior to Admission medications   Medication Sig Start Date End Date Taking? Authorizing Provider  aspirin EC 81 MG tablet Take 81 mg by mouth daily.     Yes Historical Provider, MD  aspirin-acetaminophen-caffeine (EXCEDRIN MIGRAINE) [phone] MG tablet Take 2 tablets by mouth daily as needed for headache or migraine.   Yes Historical Provider, MD  Cholecalciferol (VITAMIN D PO) Take 1 tablet by mouth daily.   Yes Historical Provider, MD  Cinnamon 500 MG capsule Take 1,000 mg by mouth daily.   Yes Historical Provider, MD  FLUoxetine (PROZAC) 20 MG capsule Take 20 mg by mouth daily. 04/05/15  Yes Historical Provider, MD  glipiZIDE (GLUCOTROL XL) 10 MG 24 hr tablet Take 10 mg by mouth daily with breakfast.   Yes Historical Provider, MD  lisinopril-hydrochlorothiazide (PRINZIDE,ZESTORETIC) 20-25 MG per tablet Take 1 tablet by mouth daily. HOLD UNTIL FOLLOW UP WITH PRIMARY CARE DOCTOR Patient taking  differently: Take 1 tablet by mouth daily.  12/07/12  Yes Barton Dubois, MD  sitaGLIPtin (JANUVIA) 50 MG tablet Take 50 mg by mouth daily.   Yes Historical Provider, MD   BP 129/79 mmHg  Ht 5\' 9"  (1.753 m)  Wt 205 lb (92.987 kg)  BMI 30.26 kg/m2  LMP 03/19/2015 Physical Exam  Constitutional: She is oriented to person,  place, and time. She appears well-developed and well-nourished. No distress.  HENT:  Head: Normocephalic and atraumatic.  Right Ear: External ear normal.  Left Ear: External ear normal.  Nose: Nose normal.  Mouth/Throat: Oropharynx is clear and moist. No oropharyngeal exudate.  Eyes: Conjunctivae and EOM are normal. Pupils are equal, round, and reactive to light. Right eye exhibits no discharge. Left eye exhibits no discharge. No scleral icterus.  Neck: Normal range of motion. Neck supple. No JVD present. No tracheal deviation present. No thyromegaly present.  Cardiovascular: Normal rate, regular rhythm and intact distal pulses.   Pulmonary/Chest: Effort normal and breath sounds normal. No stridor. No respiratory distress. She has no wheezes. She has no rales. She exhibits no tenderness.  Abdominal: Soft. She exhibits no distension. There is no tenderness.  Musculoskeletal: Normal range of motion. She exhibits no edema or tenderness.  Lymphadenopathy:    She has no cervical adenopathy.  Neurological: She is alert and oriented to person, place, and time. She has normal strength. She displays no atrophy and no tremor. No cranial nerve deficit or sensory deficit. She exhibits normal muscle tone. She displays no seizure activity. GCS eye subscore is 4. GCS verbal subscore is 5. GCS motor subscore is 6.  Skin: Skin is warm and dry. No rash noted. She is not diaphoretic. No erythema. No pallor.  Psychiatric: She has a normal mood and affect. Her behavior is normal. Judgment and thought content normal.  Nursing note and vitals reviewed.   ED Course  Procedures (including critical care time) Labs Review Labs Reviewed  BASIC METABOLIC PANEL - Abnormal; Notable for the following:    Glucose, Bld 205 (*)    All other components within normal limits  URINALYSIS, ROUTINE W REFLEX MICROSCOPIC (NOT AT [facility]) - Abnormal; Notable for the following:    APPearance CLOUDY (*)    Specific Gravity, Urine  1.034 (*)    Glucose, UA 500 (*)    Bilirubin Urine SMALL (*)    All other components within normal limits  CBG MONITORING, ED - Abnormal; Notable for the following:    Glucose-Capillary 209 (*)    All other components within normal limits  CBC  I-STAT TROPOININ, ED  POC URINE PREG, ED    Imaging Review Dg Chest 2 View  04/08/2015  CLINICAL DATA:  Palpitations and nausea for 1 hour. EXAM: CHEST  2 VIEW COMPARISON:  01/01/2010 FINDINGS: The cardiomediastinal contours are normal. The lungs are clear. Pulmonary vasculature is normal. No consolidation, pleural effusion, or pneumothorax. No acute osseous abnormalities are seen. IMPRESSION: No acute pulmonary process. Electronically Signed   By: Jeb Levering M.D.   On: 04/08/2015 20:10   I have personally reviewed and evaluated these images and lab results as part of my medical decision-making.   EKG Interpretation   Date/Time:  Sunday April 08 2015 18:57:33 EST Ventricular Rate:  76 PR Interval:  162 QRS Duration: 88 QT Interval:  376 QTC Calculation: 423 R Axis:   58 Text Interpretation:  Normal sinus rhythm Normal ECG No significant change  since last tracing Confirmed by STEINL  MD,  KEVIN ([id number]) on 04/08/2015  7:22:36 PM      MDM   Final diagnoses:  Palpitations  Dehydration  Nonintractable headache, unspecified chronicity pattern, unspecified headache type    Pt reports transient palpitations following taking headache medication and laying down for a "migraine" today.  Pt headache improved following medication.  Low PO intake today.  Pt still feeling dizzy, lightheaded and nauseous, especially with position changes.  Denies F/C, dysuria.  Other ROS negative as indicated.  Suspect dehydration and caffiene from headache preparation.  Pt normally does not drink much caffiene per day.  Plan for IV hydration, labs, and MG cocktail for HA and nausea.  Symptoms resolved following ED medications.  No concerning findings on  ECG, notably intervals are normal.  Pt feeling better following IVF.  No UTI, but UA shows dehydration findings c/w presentation.  No other acute concerns while in the ED.    Patient was given return precautions for headache, dehydration, and palpitations.  Pt advised on use of medications as applicable.  Advised to return for actely worsening symptoms, inability to take medications, or other acute concerns.  Advised to follow up with PCP in 2-3 days for repeat evaluation.  Patient and family were in agreement with and expressed understanding of follow plan, plan of care, and return precautions.  All questions answered prior to discharge.  Patient was discharged in stable condition with family, ambulating without difficulty.  Patient care was discussed with my attending, Dr. Ashok Cordia.      Hoyle Sauer, MD 04/10/15 2951  Lajean Saver, MD 04/10/15 [phone]

## 2015-06-12 ENCOUNTER — Emergency Department (HOSPITAL_COMMUNITY)
Admission: EM | Admit: 2015-06-12 | Discharge: 2015-06-12 | Disposition: A | Discharge status: Home / Self Care | Payer: BC Managed Care – PPO | Attending: Emergency Medicine | Admitting: Emergency Medicine

## 2015-06-12 ENCOUNTER — Encounter (HOSPITAL_COMMUNITY): Payer: Self-pay | Admitting: Emergency Medicine

## 2015-06-12 ENCOUNTER — Emergency Department (HOSPITAL_COMMUNITY): Payer: BC Managed Care – PPO

## 2015-06-12 DIAGNOSIS — Z7982 Long term (current) use of aspirin: Secondary | ICD-10-CM | POA: Insufficient documentation

## 2015-06-12 DIAGNOSIS — Z79899 Other long term (current) drug therapy: Secondary | ICD-10-CM | POA: Insufficient documentation

## 2015-06-12 DIAGNOSIS — Y9289 Other specified places as the place of occurrence of the external cause: Secondary | ICD-10-CM | POA: Insufficient documentation

## 2015-06-12 DIAGNOSIS — W500XXA Accidental hit or strike by another person, initial encounter: Secondary | ICD-10-CM | POA: Insufficient documentation

## 2015-06-12 DIAGNOSIS — I10 Essential (primary) hypertension: Secondary | ICD-10-CM | POA: Diagnosis not present

## 2015-06-12 DIAGNOSIS — Y9389 Activity, other specified: Secondary | ICD-10-CM | POA: Diagnosis not present

## 2015-06-12 DIAGNOSIS — M79671 Pain in right foot: Secondary | ICD-10-CM

## 2015-06-12 DIAGNOSIS — S99921A Unspecified injury of right foot, initial encounter: Secondary | ICD-10-CM | POA: Insufficient documentation

## 2015-06-12 DIAGNOSIS — E119 Type 2 diabetes mellitus without complications: Secondary | ICD-10-CM | POA: Diagnosis not present

## 2015-06-12 DIAGNOSIS — Y998 Other external cause status: Secondary | ICD-10-CM | POA: Diagnosis not present

## 2015-06-12 MED ORDER — ACETAMINOPHEN 325 MG PO TABS
650.0000 mg | ORAL_TABLET | Freq: Once | ORAL | Status: DC
  Filled 2015-06-12: qty 2

## 2015-06-12 MED ORDER — IBUPROFEN 800 MG PO TABS: 800.0000 mg | ORAL_TABLET | Freq: Three times a day (TID) | ORAL | Status: DC

## 2015-06-12 MED ORDER — IBUPROFEN 400 MG PO TABS
800.0000 mg | ORAL_TABLET | Freq: Once | ORAL | Status: DC
  Filled 2015-06-12: qty 2

## 2015-06-12 NOTE — ED Notes (Signed)
See PA assessment 

## 2015-06-12 NOTE — ED Notes (Signed)
Pt to ER with left foot pain x months after her niece rolled over on her foot; pt reports today at work her left foot started hurting in all toes and on the back side of her foot. Described as sharp. Pt is a/o x4.

## 2015-06-12 NOTE — ED Provider Notes (Signed)
CSN: [id number]     Arrival date & time 06/12/15  1712 History  By signing my name below, I, Hayley Bryant, attest that this documentation has been prepared under the direction and in the presence of [name], PA-C. Electronically Signed: [name], ED Scribe. 06/12/2015. 5:41 PM.    Chief Complaint  Patient presents with  . Foot Pain   The history is provided by the patient. No language interpreter was used.   HPI Comments: Hayley Bryant is a 45 y.o. female with h/o HTN, DM who presents to the Emergency Department complaining of mild, intermittent, sharp right foot pain to the toes and ball of the foot onset 3 months ago and worsened today in severity. Pt states her niece rolled on her foot a few months ago and she has had some mild pain since, but today it was significantly worse. 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 No noted re-injury or trauma. Pt denies taking OTC medications at home to improve symptoms.  She denies right ankle pain, left foot pain, fever, chills, numbness, weakness, paresthesia, swelling, erythema, ecchymosis, calor.  Past Medical History  Diagnosis Date  . Hypertension   . Diabetes mellitus    Past Surgical History  Procedure Laterality Date  . Cholecystectomy     Family History  Problem Relation Age of Onset  . Hypertension Mother    Social History  Substance Use Topics  . Smoking status: Never Smoker   . Smokeless tobacco: Never Used  . Alcohol Use: No   OB History    Gravida Para Term Preterm AB TAB SAB Ectopic Multiple Living   5 3 3  2 1 1   3      Review of Systems A complete 10 system review of systems was obtained and all systems are negative except as noted in the HPI and PMH.    Allergies  Metformin and related  Home Medications   Prior to Admission medications   Medication Sig Start Date End Date Taking? Authorizing Provider  aspirin EC 81 MG tablet Take 81 mg by mouth daily.      Historical Provider, MD   aspirin-acetaminophen-caffeine (EXCEDRIN MIGRAINE) [id number] MG tablet Take 2 tablets by mouth daily as needed for headache or migraine.    Historical Provider, MD  Cholecalciferol (VITAMIN D PO) Take 1 tablet by mouth daily.    Historical Provider, MD  Cinnamon 500 MG capsule Take 1,000 mg by mouth daily.    Historical Provider, MD  FLUoxetine (PROZAC) 20 MG capsule Take 20 mg by mouth daily. 04/05/15   Historical Provider, MD  glipiZIDE (GLUCOTROL XL) 10 MG 24 hr tablet Take 10 mg by mouth daily with breakfast.    Historical Provider, MD  ibuprofen (ADVIL,MOTRIN) 800 MG tablet Take 1 tablet (800 mg total) by mouth 3 (three) times daily. 06/12/15   [name], PA-C  lisinopril-hydrochlorothiazide (PRINZIDE,ZESTORETIC) 20-25 MG per tablet Take 1 tablet by mouth daily. HOLD UNTIL FOLLOW UP WITH PRIMARY CARE DOCTOR Patient taking differently: Take 1 tablet by mouth daily.  12/07/12   Barton Dubois, MD  sitaGLIPtin (JANUVIA) 50 MG tablet Take 50 mg by mouth daily.    Historical Provider, MD   BP 128/79 mmHg  Pulse 72  Temp(Src) 98.1 F (36.7 C) (Oral)  Resp 16  Ht 5\' 9"  (1.753 m)  Wt 90.719 kg  BMI 29.52 kg/m2  SpO2 100%  LMP 06/05/2015 Physical Exam  Constitutional: She is oriented to  person, place, and time. She appears well-developed and well-nourished.  HENT:  Head: Normocephalic and atraumatic.  Eyes: Conjunctivae and EOM are normal. Pupils are equal, round, and reactive to light.  Neck: Normal range of motion. Neck supple.  Cardiovascular: Normal rate, regular rhythm and normal heart sounds.   Pulses:      Dorsalis pedis pulses are 2+ on the right side, and 2+ on the left side.  Capillary refill less than 3 seconds.   Pulmonary/Chest: Effort normal. No respiratory distress.  Abdominal: Soft. Bowel sounds are normal. She exhibits no distension.  Musculoskeletal: Normal range of motion.       Right foot: Normal. There is normal range of motion, no tenderness, no bony tenderness, no  swelling, normal capillary refill and no crepitus.  Right foot non-tender to palpation. Full active ROM. No bruising, erythema or swelling.   Neurological: She is alert and oriented to person, place, and time.  Strength and sensation intact bilaterally throughout the lower extremities.   Skin: Skin is warm and dry.  Psychiatric: She has a normal mood and affect. Her behavior is normal.  Nursing note and vitals reviewed.   ED Course  Procedures (including critical care time) DIAGNOSTIC STUDIES: Oxygen Saturation is 100% on RA, normal by my interpretation.    COORDINATION OF CARE: 5:37 PM Discussed treatment plan with pt at bedside which includes XR and pt agreed to plan. Pain management with 800 mg Ibuprofen and Tylenol. D/c with post op shoe.    Imaging Review Dg Foot Complete Right  06/12/2015  CLINICAL DATA:  Dorsal right foot pain. Injury 1 week ago. Initial encounter. EXAM: RIGHT FOOT COMPLETE - 3+ VIEW COMPARISON:  08/21/2010 FINDINGS: There is no evidence of fracture or dislocation. There is no evidence of arthropathy or other focal bone abnormality. Soft tissues are unremarkable. IMPRESSION: Negative. Electronically Signed   By: Aletta Edouard M.D.   On: 06/12/2015 18:11   I have personally reviewed and evaluated these images as part of my medical decision-making.  MDM   Final diagnoses:  Right foot pain   Filed Vitals:   06/12/15 1716  BP: 128/79  Pulse: 72  Temp: 98.1 F (36.7 C)  Resp: 16   Meds given in ED:  Medications  acetaminophen (TYLENOL) tablet 650 mg (650 mg Oral Not Given 06/12/15 1806)  ibuprofen (ADVIL,MOTRIN) tablet 800 mg (800 mg Oral Not Given 06/12/15 1806)    New Prescriptions   IBUPROFEN (ADVIL,MOTRIN) 800 MG TABLET    Take 1 tablet (800 mg total) by mouth 3 (three) times daily.   Patient X-Ray negative for obvious fracture or dislocation.  Pt advised to follow up with orthopedics. Conservative therapy recommended and discussed. Patient will be  discharged home & is agreeable with above plan. Returns precautions discussed. Pt appears safe for discharge.   I personally performed the services described in this documentation, which was scribed in my presence. The recorded information has been reviewed and is accurate.   [name], PA-C 06/12/15 Vernelle Emerald  Daleen Bo, MD 06/12/15 [phone]

## 2015-06-12 NOTE — Discharge Instructions (Signed)

## 2015-06-13 ENCOUNTER — Emergency Department (HOSPITAL_COMMUNITY)
Admission: EM | Admit: 2015-06-13 | Discharge: 2015-06-13 | Disposition: A | Discharge status: Home / Self Care | Payer: BC Managed Care – PPO | Attending: Emergency Medicine | Admitting: Emergency Medicine

## 2015-06-13 ENCOUNTER — Encounter (HOSPITAL_COMMUNITY): Payer: Self-pay | Admitting: Emergency Medicine

## 2015-06-13 DIAGNOSIS — F32A Depression, unspecified: Secondary | ICD-10-CM

## 2015-06-13 DIAGNOSIS — Z7984 Long term (current) use of oral hypoglycemic drugs: Secondary | ICD-10-CM | POA: Insufficient documentation

## 2015-06-13 DIAGNOSIS — Z7982 Long term (current) use of aspirin: Secondary | ICD-10-CM | POA: Diagnosis not present

## 2015-06-13 DIAGNOSIS — Z79899 Other long term (current) drug therapy: Secondary | ICD-10-CM | POA: Insufficient documentation

## 2015-06-13 DIAGNOSIS — Z791 Long term (current) use of non-steroidal anti-inflammatories (NSAID): Secondary | ICD-10-CM | POA: Insufficient documentation

## 2015-06-13 DIAGNOSIS — I10 Essential (primary) hypertension: Secondary | ICD-10-CM | POA: Diagnosis not present

## 2015-06-13 DIAGNOSIS — F329 Major depressive disorder, single episode, unspecified: Secondary | ICD-10-CM | POA: Insufficient documentation

## 2015-06-13 DIAGNOSIS — R45851 Suicidal ideations: Secondary | ICD-10-CM | POA: Insufficient documentation

## 2015-06-13 DIAGNOSIS — F419 Anxiety disorder, unspecified: Secondary | ICD-10-CM | POA: Diagnosis not present

## 2015-06-13 DIAGNOSIS — E119 Type 2 diabetes mellitus without complications: Secondary | ICD-10-CM | POA: Diagnosis not present

## 2015-06-13 HISTORY — DX: Major depressive disorder, single episode, unspecified: F32.9

## 2015-06-13 HISTORY — DX: Anxiety disorder, unspecified: F41.9

## 2015-06-13 HISTORY — DX: Depression, unspecified: F32.A

## 2015-06-13 LAB — CBC
HCT: 36.8 % (ref 36.0–46.0)
Hemoglobin: 11.8 g/dL — ABNORMAL LOW (ref 12.0–15.0)
MCH: 29.6 pg (ref 26.0–34.0)
MCHC: 32.1 g/dL (ref 30.0–36.0)
MCV: 92.2 fL (ref 78.0–100.0)
PLATELETS: 350 10*3/uL (ref 150–400)
RBC: 3.99 MIL/uL (ref 3.87–5.11)
RDW: 13.6 % (ref 11.5–15.5)
WBC: 7.5 10*3/uL (ref 4.0–10.5)

## 2015-06-13 LAB — COMPREHENSIVE METABOLIC PANEL
ALT: 22 U/L (ref 14–54)
AST: 20 U/L (ref 15–41)
Albumin: 3.9 g/dL (ref 3.5–5.0)
Alkaline Phosphatase: 48 U/L (ref 38–126)
Anion gap: 8 (ref 5–15)
BILIRUBIN TOTAL: 0.7 mg/dL (ref 0.3–1.2)
BUN: 12 mg/dL (ref 6–20)
CALCIUM: 9.4 mg/dL (ref 8.9–10.3)
CO2: 28 mmol/L (ref 22–32)
CREATININE: 0.77 mg/dL (ref 0.44–1.00)
Chloride: 104 mmol/L (ref 101–111)
Glucose, Bld: 132 mg/dL — ABNORMAL HIGH (ref 65–99)
Potassium: 3.5 mmol/L (ref 3.5–5.1)
Sodium: 140 mmol/L (ref 135–145)
TOTAL PROTEIN: 7.3 g/dL (ref 6.5–8.1)

## 2015-06-13 LAB — RAPID URINE DRUG SCREEN, HOSP PERFORMED
AMPHETAMINES: NOT DETECTED
Barbiturates: NOT DETECTED
Benzodiazepines: NOT DETECTED
Cocaine: NOT DETECTED
OPIATES: NOT DETECTED
Tetrahydrocannabinol: NOT DETECTED

## 2015-06-13 LAB — ETHANOL

## 2015-06-13 LAB — SALICYLATE LEVEL

## 2015-06-13 LAB — ACETAMINOPHEN LEVEL: Acetaminophen (Tylenol), Serum: <10 ug/mL — ABNORMAL LOW (ref 10–30)

## 2015-06-13 NOTE — ED Notes (Addendum)
Pt.family  took belonging home.

## 2015-06-13 NOTE — ED Notes (Signed)
Presents with depression, denies SI.  Denies feeling hopeless. AAO x 3, no distress noted, calm & cooperative, flat affect.  Interactive with staff.  Monitoring for safety, Q 15 min checks in effect.

## 2015-06-13 NOTE — ED Provider Notes (Signed)
CSN: [id number]     Arrival date & time 06/13/15  1629 History   First MD Initiated Contact with Patient 06/13/15 1737     Chief Complaint  Patient presents with  . Depression     (Consider location/radiation/quality/duration/timing/severity/associated sxs/prior Treatment) Patient is a 45 y.o. female presenting with mental health disorder.  Mental Health Problem Presenting symptoms: depression and suicidal thoughts   Degree of incapacity (severity):  Mild Onset quality:  Gradual Timing:  Constant Chronicity:  New Context: not alcohol use and not drug abuse   Relieved by:  None tried Worsened by:  Nothing tried Ineffective treatments:  None tried Associated symptoms: no anxiety     Past Medical History  Diagnosis Date  . Hypertension   . Diabetes mellitus   . Depression   . Anxiety    Past Surgical History  Procedure Laterality Date  . Cholecystectomy     Family History  Problem Relation Age of Onset  . Hypertension Mother    Social History  Substance Use Topics  . Smoking status: Never Smoker   . Smokeless tobacco: Never Used  . Alcohol Use: No   OB History    Gravida Para Term Preterm AB TAB SAB Ectopic Multiple Living   5 3 3  2 1 1   3      Review of Systems  Psychiatric/Behavioral: Positive for suicidal ideas and dysphoric mood. The patient is not nervous/anxious.   All other systems reviewed and are negative.     Allergies  Metformin and related  Home Medications   Prior to Admission medications   Medication Sig Start Date End Date Taking? Authorizing Provider  aspirin EC 81 MG tablet Take 81 mg by mouth daily.     Yes Historical Provider, MD  aspirin-acetaminophen-caffeine (EXCEDRIN MIGRAINE) [phone] MG tablet Take 2 tablets by mouth daily as needed for headache or migraine.   Yes Historical Provider, MD  Cholecalciferol (VITAMIN D3) 2000 units capsule Take 2,000 Units by mouth daily.   Yes Historical Provider, MD  Cinnamon 500 MG capsule  Take 1,000 mg by mouth daily.   Yes Historical Provider, MD  FLUoxetine (PROZAC) 20 MG capsule Take 20 mg by mouth daily. 04/05/15  Yes Historical Provider, MD  glipiZIDE (GLUCOTROL XL) 10 MG 24 hr tablet Take 10 mg by mouth daily with breakfast.   Yes Historical Provider, MD  ibuprofen (ADVIL,MOTRIN) 800 MG tablet Take 1 tablet (800 mg total) by mouth 3 (three) times daily. 06/12/15  Yes Kayla Rose, PA-C  lisinopril-hydrochlorothiazide (PRINZIDE,ZESTORETIC) 20-25 MG per tablet Take 1 tablet by mouth daily. HOLD UNTIL FOLLOW UP WITH PRIMARY CARE DOCTOR Patient taking differently: Take 1 tablet by mouth daily.  12/07/12  Yes Barton Dubois, MD  sitaGLIPtin (JANUVIA) 50 MG tablet Take 50 mg by mouth daily.   Yes Historical Provider, MD   BP 130/89 mmHg  Pulse 69  Temp(Src) 98.2 F (36.8 C) (Oral)  Resp 16  SpO2 99%  LMP 06/05/2015 Physical Exam  Constitutional: She is oriented to person, place, and time. She appears well-developed and well-nourished.  HENT:  Head: Normocephalic and atraumatic.  Neck: Normal range of motion.  Cardiovascular: Normal rate and regular rhythm.   Pulmonary/Chest: No stridor. No respiratory distress.  Abdominal: She exhibits no distension.  Neurological: She is alert and oriented to person, place, and time. No cranial nerve deficit. Coordination normal.  Psychiatric: Thought content is not paranoid and not delusional. She does not express impulsivity. She exhibits a depressed mood. She expresses  suicidal ideation. She expresses no homicidal ideation. She expresses no suicidal plans and no homicidal plans.  Nursing note and vitals reviewed.   ED Course  Procedures (including critical care time) Labs Review Labs Reviewed  COMPREHENSIVE METABOLIC PANEL - Abnormal; Notable for the following:    Glucose, Bld 132 (*)    All other components within normal limits  ACETAMINOPHEN LEVEL - Abnormal; Notable for the following:    Acetaminophen (Tylenol), Serum <10 (*)     All other components within normal limits  CBC - Abnormal; Notable for the following:    Hemoglobin 11.8 (*)    All other components within normal limits  ETHANOL  SALICYLATE LEVEL  URINE RAPID DRUG SCREEN, HOSP PERFORMED    Imaging Review Dg Foot Complete Right  06/12/2015  CLINICAL DATA:  Dorsal right foot pain. Injury 1 week ago. Initial encounter. EXAM: RIGHT FOOT COMPLETE - 3+ VIEW COMPARISON:  08/21/2010 FINDINGS: There is no evidence of fracture or dislocation. There is no evidence of arthropathy or other focal bone abnormality. Soft tissues are unremarkable. IMPRESSION: Negative. Electronically Signed   By: Aletta Edouard M.D.   On: 06/12/2015 18:11   I have personally reviewed and evaluated these images and lab results as part of my medical decision-making.   EKG Interpretation None      MDM   Final diagnoses:  Depression    Worsening depression and anxiety. Hates her new job. Thought about suicide earlier today but denies SI now, jsut wants a break fro her job. TTS evaluated and thought patient stable for dc with signing a safety contract. Patient still not with SI. Work note provided, will follow up with psych.     Merrily Pew, MD 06/14/15 [phone]

## 2015-06-13 NOTE — ED Notes (Signed)
MD at bedside. 
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
## 2015-06-13 NOTE — ED Notes (Signed)
TTS Consult completed pts daughter walked to ED Lobby to meet and be picked-up by daughter.

## 2015-06-13 NOTE — ED Notes (Addendum)
Pt states she woke up this morning feeling sad all of sudden. Pt does take medication as should. Pt also c/o SI at times. Pt states she does not know why she feels this way and is tearful in triage. Pt states she has attempted before to commit suicide.

## 2016-04-13 ENCOUNTER — Emergency Department (HOSPITAL_COMMUNITY)
Admission: EM | Admit: 2016-04-13 | Discharge: 2016-04-13 | Disposition: A | Discharge status: Home / Self Care | Payer: Managed Care, Other (non HMO) | Attending: Emergency Medicine | Admitting: Emergency Medicine

## 2016-04-13 ENCOUNTER — Encounter (HOSPITAL_COMMUNITY): Payer: Self-pay

## 2016-04-13 DIAGNOSIS — E119 Type 2 diabetes mellitus without complications: Secondary | ICD-10-CM | POA: Insufficient documentation

## 2016-04-13 DIAGNOSIS — H1012 Acute atopic conjunctivitis, left eye: Secondary | ICD-10-CM | POA: Insufficient documentation

## 2016-04-13 DIAGNOSIS — I10 Essential (primary) hypertension: Secondary | ICD-10-CM | POA: Insufficient documentation

## 2016-04-13 DIAGNOSIS — Z7982 Long term (current) use of aspirin: Secondary | ICD-10-CM | POA: Insufficient documentation

## 2016-04-13 DIAGNOSIS — H1013 Acute atopic conjunctivitis, bilateral: Secondary | ICD-10-CM

## 2016-04-13 DIAGNOSIS — Z7984 Long term (current) use of oral hypoglycemic drugs: Secondary | ICD-10-CM | POA: Insufficient documentation

## 2016-04-13 DIAGNOSIS — H00015 Hordeolum externum left lower eyelid: Secondary | ICD-10-CM

## 2016-04-13 DIAGNOSIS — H1011 Acute atopic conjunctivitis, right eye: Secondary | ICD-10-CM | POA: Insufficient documentation

## 2016-04-13 DIAGNOSIS — Z79899 Other long term (current) drug therapy: Secondary | ICD-10-CM | POA: Insufficient documentation

## 2016-04-13 MED ORDER — CEPHALEXIN 500 MG PO CAPS: 500.0000 mg | ORAL_CAPSULE | Freq: Two times a day (BID) | ORAL | 0 refills | Status: DC

## 2016-04-13 MED ORDER — NAPHAZOLINE-PHENIRAMINE 0.025-0.3 % OP SOLN: 1.0000 [drp] | OPHTHALMIC | 0 refills | Status: DC | PRN

## 2016-04-13 NOTE — ED Triage Notes (Signed)
Patient complains of bilateral eye itching since Friday, reports that her eye itching is chronic but worse since Friday. Now has swelling and stye to left lid. NAD

## 2016-04-13 NOTE — ED Notes (Signed)
Papers and medications reviewed with patient. Verbalized understanding and intent to follow up

## 2016-04-13 NOTE — Discharge Instructions (Signed)
Try warm compresses (use a warm tea bag) and place on eyelid for 15 minutes at a time approximately three to four times per day. Start antibiotics if it is not improving or getting worse in 2-3 days Follow up with eye doctor if symptoms are getting worse

## 2016-04-13 NOTE — ED Notes (Signed)
L eye 20/40 R eye 20/40 Done with glasses on per PA advice

## 2016-04-13 NOTE — ED Provider Notes (Signed)
[facility] DEPT Provider Note   CSN: [id number] Arrival date & time: 04/13/16  [id number]     History   Chief Complaint No chief complaint on file.   HPI Hayley Bryant is a 45 y.o. female who presents with eye problem. She states her eyes are always itchy bilaterally with associated watery drainage. Symptoms have been ongoing for months. She denies allergies, hx of eczema or asthma. She wears glasses but not contacts. She has not tried anything to help with her symptoms. Additionally she has had pain to the left eye with associated redness and swelling which started yesterday. She has never had a stye before. Denies fever, URI symptoms, vision changes, conjunctival redness, dry eye, eye pain  HPI  Past Medical History:  Diagnosis Date  . Anxiety   . Depression   . Diabetes mellitus   . Hypertension     Patient Active Problem List   Diagnosis Date Noted  . Suicidal overdose ([place]) 12/06/2012  . HTN (hypertension) 12/06/2012  . DM (diabetes mellitus) ([place]) 12/06/2012    Past Surgical History:  Procedure Laterality Date  . CHOLECYSTECTOMY      OB History    Gravida Para Term Preterm AB Living   5 3 3   2 3    SAB TAB Ectopic Multiple Live Births   1 1     3        Home Medications    Prior to Admission medications   Medication Sig Start Date End Date Taking? Authorizing Provider  aspirin EC 81 MG tablet Take 81 mg by mouth daily.      Historical Provider, MD  aspirin-acetaminophen-caffeine (EXCEDRIN MIGRAINE) [phone] MG tablet Take 2 tablets by mouth daily as needed for headache or migraine.    Historical Provider, MD  Cholecalciferol (VITAMIN D3) 2000 units capsule Take 2,000 Units by mouth daily.    Historical Provider, MD  Cinnamon 500 MG capsule Take 1,000 mg by mouth daily.    Historical Provider, MD  FLUoxetine (PROZAC) 20 MG capsule Take 20 mg by mouth daily. 04/05/15   Historical Provider, MD  glipiZIDE (GLUCOTROL XL) 10 MG 24 hr tablet Take 10 mg by mouth  daily with breakfast.    Historical Provider, MD  ibuprofen (ADVIL,MOTRIN) 800 MG tablet Take 1 tablet (800 mg total) by mouth 3 (three) times daily. 06/12/15   [name], PA-C  lisinopril-hydrochlorothiazide (PRINZIDE,ZESTORETIC) 20-25 MG per tablet Take 1 tablet by mouth daily. HOLD UNTIL FOLLOW UP WITH PRIMARY CARE DOCTOR Patient taking differently: Take 1 tablet by mouth daily.  12/07/12   Barton Dubois, MD  sitaGLIPtin (JANUVIA) 50 MG tablet Take 50 mg by mouth daily.    Historical Provider, MD    Family History Family History  Problem Relation Age of Onset  . Hypertension Mother     Social History Social History  Substance Use Topics  . Smoking status: Never Smoker  . Smokeless tobacco: Never Used  . Alcohol use No     Allergies   Metformin and related   Review of Systems Review of Systems  Constitutional: Negative for chills and fever.  HENT: Negative for congestion, ear pain, sinus pressure and sore throat.   Eyes: Positive for pain (right eyelid pain), discharge and itching. Negative for photophobia, redness and visual disturbance.     Physical Exam Updated Vital Signs BP 111/87 (BP Location: Left Arm)   Pulse 81   Temp 98.2 F (36.8 C) (Oral)   Resp 20   SpO2 97%  Physical Exam  Constitutional: She is oriented to person, place, and time. She appears well-developed and well-nourished. No distress.  HENT:  Head: Normocephalic and atraumatic.  Eyes: Conjunctivae and EOM are normal. Pupils are equal, round, and reactive to light. Right eye exhibits no discharge and no hordeolum. Left eye exhibits hordeolum. Left eye exhibits no discharge. Right conjunctiva is not injected. Left conjunctiva is not injected. No scleral icterus.    Neck: Normal range of motion.  Cardiovascular: Normal rate.   Pulmonary/Chest: Effort normal. No respiratory distress.  Abdominal: She exhibits no distension.  Neurological: She is alert and oriented to person, place, and time.    Skin: Skin is warm and dry.  Psychiatric: She has a normal mood and affect. Her behavior is normal.  Nursing note and vitals reviewed.    ED Treatments / Results  Labs (all labs ordered are listed, but only abnormal results are displayed) Labs Reviewed - No data to display  EKG  EKG Interpretation None       Radiology No results found.  Procedures Procedures (including critical care time)  Medications Ordered in ED Medications - No data to display   Initial Impression / Assessment and Plan / ED Course  I have reviewed the triage vital signs and the nursing notes.  Pertinent labs & imaging results that were available during my care of the patient were reviewed by me and considered in my medical decision making (see chart for details).  Clinical Course    45 year old female with bilateral eye itching and a hordeolum. Visual acuity is 20/40 bilaterally. She has not tried any medicines for her symptoms. Will have her try Naphcon and advised warm compresses for stye. Rx for Keflex given if symptoms are not improving and ophthalmology follow up. Return precautions given.  Final Clinical Impressions(s) / ED Diagnoses   Final diagnoses:  Allergic conjunctivitis of both eyes  Hordeolum externum of left lower eyelid    New Prescriptions New Prescriptions   CEPHALEXIN (KEFLEX) 500 MG CAPSULE    Take 1 capsule (500 mg total) by mouth 2 (two) times daily.   NAPHAZOLINE-PHENIRAMINE (NAPHCON-A) 0.025-0.3 % OPHTHALMIC SOLUTION    Place 1 drop into both eyes every 4 (four) hours as needed for irritation.     Recardo Evangelist, PA-C 04/13/16 [id number]    Fredia Sorrow, MD 04/14/16 [phone]

## 2016-08-22 ENCOUNTER — Encounter: Payer: Self-pay | Admitting: Neurology

## 2016-08-22 ENCOUNTER — Ambulatory Visit (INDEPENDENT_AMBULATORY_CARE_PROVIDER_SITE_OTHER): Payer: Managed Care, Other (non HMO) | Admitting: Neurology

## 2016-08-22 VITALS — BP 118/85 | HR 73 | Ht 69.0 in | Wt 185.2 lb

## 2016-08-22 DIAGNOSIS — G10 Huntington's disease: Secondary | ICD-10-CM

## 2016-08-22 DIAGNOSIS — G259 Extrapyramidal and movement disorder, unspecified: Secondary | ICD-10-CM | POA: Diagnosis not present

## 2016-08-22 DIAGNOSIS — G1229 Other motor neuron disease: Secondary | ICD-10-CM

## 2016-08-22 DIAGNOSIS — G1221 Amyotrophic lateral sclerosis: Secondary | ICD-10-CM

## 2016-08-22 NOTE — Patient Instructions (Signed)
Remember to drink plenty of fluid, eat healthy meals and do not skip any meals. Try to eat protein with a every meal and eat a healthy snack such as fruit or nuts in between meals. Try to keep a regular sleep-wake schedule and try to exercise daily, particularly in the form of walking, 20-30 minutes a day, if you can.   As far as diagnostic testing: MRI brain and cervical spine, labs, referral to [facility]  I would like to see you back in 4 months, sooner if we need to. Please call us with any interim questions, concerns, problems, updates or refill requests.   Our phone number is [phone]. We also have an after hours call service for urgent matters and there is a physician on-call for urgent questions. For any emergencies you know to call 911 or go to the nearest emergency room

## 2016-08-22 NOTE — Progress Notes (Signed)
[facility]    Provider:  Dr Jaynee Eagles Referring Provider: Barrie Lyme, FNP Primary Care Physician:  Barrie Lyme, FNP  CC:  FHx of Huntington's disease now exhibiting symptoms  HPI:  Hayley Bryant is a 46 y.o. female here as a referral from Dr. Mel Almond for evaluation of Huntington's disease. PMHx DM2, HTN, depression, anxiety. Mother was diagnosed with Huntington's and her grandmother and uncle on mother's side has a "movement disorder" not diagnosed likely Huntington's as well. Mother started exhibiting signs in her 40s(review of records states mother started in 61s however). Patient has noticed she has uncontrollable movements. Her legs will move. Brief and rapid. Not repetitive. She is very restless. Worsening. Started 6 months ago. She is noticing it more. Her mother constantly moved as well. She has lost 12 pounds but coincided with starting trulicity. No abnormal eye movements. No muscle rigidity. She has anxiety and depression but not worsened. She denies any recent memory changes. No tremors. No seizures. No muscle issues such as dystonia. No walking problems or gait abnormality or falls. No other focal neurologic deficits, associated symptoms, inciting events or modifiable factors. Sleeping fine. No other focal neurologic deficits, associated symptoms, inciting events or modifiable factors.  Reviewed notes, labs and imaging from outside physicians, which showed: Reviewed notes from Primary Care significant for the following: Recently started treatment of DM2 with new medication. She has HTN but not checking BP at home but no symptoms of HTn or hypotension and she maintains compliance of meds. She is concerned about extensive FHx of Huntington's disease. Her mother, maternal grandmother, maternal aunts and uncles with disorder. Mother started exhibiting in her 21s. Patient has had involuntary movements for 6 months and others have noticed. She is treated with prozac for  depression and anxiety.  Review of Systems: Patient complains of symptoms per HPI as well as the following symptoms: no CP, no SOB. Pertinent negatives per HPI. All others negative.   Social History   Social History  . Marital status: Married    Spouse name: N/A  . Number of children: 3  . Years of education: 59   Occupational History  . Tina History Main Topics  . Smoking status: Never Smoker  . Smokeless tobacco: Never Used  . Alcohol use No  . Drug use: No  . Sexual activity: Yes    Partners: Male    Birth control/ protection: None   Other Topics Concern  . Not on file   Social History Narrative   Lives at home w/ her husband and children   Right-handed   Caffeine: drinks coffee or soda when working nights    Family History  Problem Relation Age of Onset  . Hypertension Mother   . Heart attack Mother   . Huntington's disease Mother   . Huntington's disease Maternal Grandmother     Past Medical History:  Diagnosis Date  . Anxiety   . Depression   . Diabetes mellitus   . Hypertension     Past Surgical History:  Procedure Laterality Date  . CHOLECYSTECTOMY    . LYMPH NODE DISSECTION      Current Outpatient Prescriptions  Medication Sig Dispense Refill  . aspirin EC 81 MG tablet Take 81 mg by mouth daily.      Marland Kitchen aspirin-acetaminophen-caffeine (EXCEDRIN MIGRAINE) 250-250-65 MG tablet Take 2 tablets by mouth daily as needed for headache or migraine.    Marland Kitchen atorvastatin (LIPITOR) 10 MG tablet Take by  mouth.    . cephALEXin (KEFLEX) 500 MG capsule Take 1 capsule (500 mg total) by mouth 2 (two) times daily. 10 capsule 0  . Cholecalciferol (VITAMIN D3) 2000 units capsule Take 2,000 Units by mouth daily.    . Cinnamon 500 MG capsule Take 1,000 mg by mouth daily.    . Dulaglutide (TRULICITY) 1.5 VH/8.4ON SOPN INJECT 0.5 MLS SUBCUTANEOUSLY ONCE A WEEK. START AFTER USING 0.75 MG FOR 4 WEEKS    . FLUoxetine (PROZAC) 20 MG capsule Take 20 mg by  mouth daily.    Marland Kitchen ibuprofen (ADVIL,MOTRIN) 800 MG tablet Take 1 tablet (800 mg total) by mouth 3 (three) times daily. 21 tablet 0  . lisinopril-hydrochlorothiazide (PRINZIDE,ZESTORETIC) 20-25 MG per tablet Take 1 tablet by mouth daily. HOLD UNTIL FOLLOW UP WITH PRIMARY CARE DOCTOR (Patient taking differently: Take 1 tablet by mouth daily. )    . naphazoline-pheniramine (NAPHCON-A) 0.025-0.3 % ophthalmic solution Place 1 drop into both eyes every 4 (four) hours as needed for irritation. 15 mL 0  . sitaGLIPtin (JANUVIA) 50 MG tablet Take 50 mg by mouth daily.     No current facility-administered medications for this visit.     Allergies as of 08/22/2016 - Review Complete 08/22/2016  Allergen Reaction Noted  . Metformin and related Other (See Comments) 02/25/2015    Vitals: BP 118/85   Pulse 73   Ht 5\' 9"  (1.753 m)   Wt 185 lb 3.2 oz (84 kg)   BMI 27.35 kg/m  Last Weight:  Wt Readings from Last 1 Encounters:  08/22/16 185 lb 3.2 oz (84 kg)   Last Height:   Ht Readings from Last 1 Encounters:  08/22/16 5\' 9"  (1.753 m)   Physical exam: Exam: Gen: NAD, conversant, well nourised, well groomed                     CV: RRR, no MRG. No Carotid Bruits. No peripheral edema, warm, nontender Eyes: Conjunctivae clear without exudates or hemorrhage  Neuro: Detailed Neurologic Exam  Speech:    Speech is normal; fluent and spontaneous with normal comprehension.  Cognition:    The patient is oriented to person, place, and time;     recent and remote memory intact;     language fluent;     normal attention, concentration,     fund of knowledge Cranial Nerves:    The pupils are equal, round, and reactive to light. The fundi are normal and spontaneous venous pulsations are present. Visual fields are full to finger confrontation. Extraocular movements are intact. Trigeminal sensation is intact and the muscles of mastication are normal. The face is symmetric. The palate elevates in the midline.  Hearing intact. Voice is normal. Shoulder shrug is normal. The tongue has normal motion without fasciculations.   Coordination:    Normal finger to nose and heel to shin.   Gait:    Heel-toe and tandem gait are normal.   Motor Observation:   Tapering distally in the LE. Abrupt movements head, not repetitive, brief.  Tone:    Normal muscle tone.    Posture:    Posture is normal. normal erect    Strength:    Strength is V/V in the upper and lower limbs.      Sensation: intact to LT     Reflex Exam:  DTR's:    Deep tendon reflexes in the upper and lower extremities are brisk bilaterally.   Toes:  Right upgoing   Clonus:  Clonus is absent.       Assessment/Plan:  46 year old with significant family history of Huntington's disease now starting to exhibit symptoms. Exam shows abrupt movements of head, not repetitive and brief, right upgoing toe, brisk reflexes, normal tone. Will order extensive lab testing, mri brain and cervical spine to evaluate for other causes such as tumors and lesions or MS. [facility] to specialized Huntington's clinic to see if they can provide genetic counseling and genetic testing. This is concerning for Huntington's disease. I will continue to follow patient.   Cc: Barrie Lyme, FNP  Orders Placed This Encounter  Procedures  . MR BRAIN W WO CONTRAST  . MR CERVICAL SPINE WO CONTRAST  . TSH  . HIV antibody  . Sedimentation rate  . ANA w/Reflex  . B12 and Folate Panel  . RPR  . Hepatitis C antibody  . Paraneoplastic Profile 1  . Heavy metals, blood  . Vitamin B6  . B. burgdorfi Antibody  . Vitamin B1  . Basic Metabolic Panel  . CK  . Ambulatory referral to Neurology    Sarina Ill, MD  [facility] [address] [place], [place] [address]  Phone [phone] Fax [phone]

## 2016-08-24 ENCOUNTER — Encounter: Payer: Self-pay | Admitting: Neurology

## 2016-08-26 ENCOUNTER — Telehealth: Payer: Self-pay

## 2016-08-26 NOTE — Telephone Encounter (Signed)
-----   Message from Melvenia Beam, MD sent at 08/26/2016 12:24 PM EDT ----- Labs unremarkable thanks

## 2016-08-26 NOTE — Telephone Encounter (Signed)
Called pt w/ unremarkable lab results. May call back w/ additional questions/concerns. 

## 2016-08-27 LAB — HEAVY METALS, BLOOD
Arsenic: 6 ug/L (ref 2–23)
LEAD, BLOOD: NOT DETECTED ug/dL (ref 0–19)
Mercury: NOT DETECTED ug/L (ref 0.0–14.9)

## 2016-08-27 LAB — BASIC METABOLIC PANEL
BUN/Creatinine Ratio: 17 (ref 9–23)
BUN: 13 mg/dL (ref 6–24)
CALCIUM: 9.9 mg/dL (ref 8.7–10.2)
CO2: 31 mmol/L — ABNORMAL HIGH (ref 18–29)
Chloride: 101 mmol/L (ref 96–106)
Creatinine, Ser: 0.78 mg/dL (ref 0.57–1.00)
GFR calc Af Amer: 107 mL/min/{1.73_m2} (ref >59)
GFR, EST NON AFRICAN AMERICAN: 93 mL/min/{1.73_m2} (ref >59)
GLUCOSE: 140 mg/dL — AB (ref 65–99)
POTASSIUM: 3.9 mmol/L (ref 3.5–5.2)
Sodium: 145 mmol/L — ABNORMAL HIGH (ref 134–144)

## 2016-08-27 LAB — ANA W/REFLEX: ANA: NEGATIVE

## 2016-08-27 LAB — VITAMIN B6: Vitamin B6: 6.8 ug/L (ref 2.0–32.8)

## 2016-08-27 LAB — PARANEOPLASTIC PROFILE 1
Neuronal Nuclear (Hu) Antibody (IB): <1:10 {titer}
Purkinje Cell (Yo) Autoantobodies- IFA: <1:10 {titer}

## 2016-08-27 LAB — TSH: TSH: 1.23 u[IU]/mL (ref 0.450–4.500)

## 2016-08-27 LAB — VITAMIN B1: Thiamine: 96.9 nmol/L (ref 66.5–200.0)

## 2016-08-27 LAB — B12 AND FOLATE PANEL
Folate: 13.6 ng/mL (ref >3.0)
Vitamin B-12: 459 pg/mL (ref 232–1245)

## 2016-08-27 LAB — B. BURGDORFI ANTIBODIES

## 2016-08-27 LAB — RPR: RPR: NONREACTIVE

## 2016-08-27 LAB — SEDIMENTATION RATE: SED RATE: 13 mm/h (ref 0–32)

## 2016-08-27 LAB — HIV ANTIBODY (ROUTINE TESTING W REFLEX): HIV Screen 4th Generation wRfx: NONREACTIVE

## 2016-08-27 LAB — HEPATITIS C ANTIBODY: Hep C Virus Ab: <0.1 s/co ratio (ref 0.0–0.9)

## 2016-08-27 LAB — CK: CK TOTAL: 103 U/L (ref 24–173)

## 2016-09-24 ENCOUNTER — Telehealth: Payer: Self-pay | Admitting: Neurology

## 2016-09-24 ENCOUNTER — Ambulatory Visit
Admission: RE | Admit: 2016-09-24 | Discharge: 2016-09-24 | Disposition: A | Discharge status: Home / Self Care | Payer: Managed Care, Other (non HMO) | Source: Ambulatory Visit | Attending: Neurology | Admitting: Neurology

## 2016-09-24 DIAGNOSIS — G1221 Amyotrophic lateral sclerosis: Secondary | ICD-10-CM

## 2016-09-24 DIAGNOSIS — G10 Huntington's disease: Secondary | ICD-10-CM

## 2016-09-24 DIAGNOSIS — G259 Extrapyramidal and movement disorder, unspecified: Secondary | ICD-10-CM

## 2016-09-24 DIAGNOSIS — G1229 Other motor neuron disease: Secondary | ICD-10-CM

## 2016-09-24 MED ORDER — ALPRAZOLAM 0.5 MG PO TABS: ORAL_TABLET | ORAL | 0 refills | Status: DC

## 2016-09-24 NOTE — Telephone Encounter (Signed)
Pt said she could not complete MRI today. She is wanting to try again but needs something to help calm her prior to test.

## 2016-09-24 NOTE — Telephone Encounter (Signed)
That's fine. thanks

## 2016-09-24 NOTE — Telephone Encounter (Signed)
Rx printed, awaiting signature. 

## 2016-09-26 NOTE — Telephone Encounter (Signed)
Called pt. Says that she was able to complete cervical MRI but not MRI brain. Xanax rx signed and faxed to verified pharmacy. Instructions reviewed w/ pt who verbalized understanding and appreciation for call.

## 2016-09-29 ENCOUNTER — Telehealth: Payer: Self-pay | Admitting: Neurology

## 2016-09-29 DIAGNOSIS — G259 Extrapyramidal and movement disorder, unspecified: Secondary | ICD-10-CM

## 2016-09-29 DIAGNOSIS — G1220 Motor neuron disease, unspecified: Secondary | ICD-10-CM

## 2016-09-29 NOTE — Telephone Encounter (Signed)
Order re-entered

## 2016-09-29 NOTE — Telephone Encounter (Signed)
Can you please put a new order in for the MRI Brain w/wo for some reason when she cancelled her appointment with GI it cancelled the whole order and she is wanting to reschedule her MRI Hayley Bryant.

## 2016-09-29 NOTE — Telephone Encounter (Signed)
Noted, thank you

## 2016-10-01 ENCOUNTER — Telehealth: Payer: Self-pay | Admitting: *Deleted

## 2016-10-01 NOTE — Telephone Encounter (Signed)
Per Dr Jaynee Eagles, spoke with patient and informed her that her MRI of her cervical spine is unremarkable, nothing of concern. She verbalized understanding, appreciation.

## 2016-10-10 ENCOUNTER — Ambulatory Visit
Admission: RE | Admit: 2016-10-10 | Discharge: 2016-10-10 | Disposition: A | Discharge status: Home / Self Care | Payer: Managed Care, Other (non HMO) | Source: Ambulatory Visit | Attending: Neurology | Admitting: Neurology

## 2016-10-10 DIAGNOSIS — G259 Extrapyramidal and movement disorder, unspecified: Secondary | ICD-10-CM | POA: Diagnosis not present

## 2016-10-10 DIAGNOSIS — G1220 Motor neuron disease, unspecified: Secondary | ICD-10-CM

## 2016-10-10 MED ORDER — GADOBENATE DIMEGLUMINE 529 MG/ML IV SOLN
17.0000 mL | Freq: Once | INTRAVENOUS | Status: AC | PRN
  Administered 2016-10-10: 17 mL via INTRAVENOUS

## 2016-10-18 ENCOUNTER — Encounter (HOSPITAL_COMMUNITY): Payer: Self-pay | Admitting: Emergency Medicine

## 2016-10-18 ENCOUNTER — Emergency Department (HOSPITAL_COMMUNITY)
Admission: EM | Admit: 2016-10-18 | Discharge: 2016-10-19 | Disposition: A | Discharge status: Home / Self Care | Payer: Managed Care, Other (non HMO) | Attending: Emergency Medicine | Admitting: Emergency Medicine

## 2016-10-18 DIAGNOSIS — I10 Essential (primary) hypertension: Secondary | ICD-10-CM | POA: Diagnosis not present

## 2016-10-18 DIAGNOSIS — E119 Type 2 diabetes mellitus without complications: Secondary | ICD-10-CM | POA: Diagnosis not present

## 2016-10-18 DIAGNOSIS — Z7982 Long term (current) use of aspirin: Secondary | ICD-10-CM | POA: Diagnosis not present

## 2016-10-18 DIAGNOSIS — N760 Acute vaginitis: Secondary | ICD-10-CM | POA: Diagnosis not present

## 2016-10-18 DIAGNOSIS — Z79899 Other long term (current) drug therapy: Secondary | ICD-10-CM | POA: Diagnosis not present

## 2016-10-18 DIAGNOSIS — N939 Abnormal uterine and vaginal bleeding, unspecified: Secondary | ICD-10-CM | POA: Diagnosis present

## 2016-10-18 DIAGNOSIS — B9689 Other specified bacterial agents as the cause of diseases classified elsewhere: Secondary | ICD-10-CM

## 2016-10-18 HISTORY — DX: Benign neoplasm of connective and other soft tissue, unspecified: D21.9

## 2016-10-18 LAB — URINALYSIS, ROUTINE W REFLEX MICROSCOPIC
Bilirubin Urine: NEGATIVE
GLUCOSE, UA: NEGATIVE mg/dL
HGB URINE DIPSTICK: NEGATIVE
Ketones, ur: NEGATIVE mg/dL
LEUKOCYTES UA: NEGATIVE
Nitrite: NEGATIVE
PH: 5 (ref 5.0–8.0)
PROTEIN: NEGATIVE mg/dL
SPECIFIC GRAVITY, URINE: 1.028 (ref 1.005–1.030)

## 2016-10-18 LAB — PREGNANCY, URINE: Preg Test, Ur: NEGATIVE

## 2016-10-18 NOTE — ED Notes (Signed)
EDP at bedside for pelvic exam

## 2016-10-18 NOTE — ED Triage Notes (Signed)
Pt sts abnormal vaginal bleeding today and sts hx of fibroids; pt sts small amount of blood noted when wiping

## 2016-10-19 LAB — WET PREP, GENITAL
SPERM: NONE SEEN
Trich, Wet Prep: NONE SEEN
Yeast Wet Prep HPF POC: NONE SEEN

## 2016-10-19 MED ORDER — METRONIDAZOLE 500 MG PO TABS: 500.0000 mg | ORAL_TABLET | Freq: Two times a day (BID) | ORAL | 0 refills | Status: DC

## 2016-10-19 NOTE — ED Provider Notes (Signed)
[place] DEPT Provider Note   CSN: [id number] Arrival date & time: 10/18/16  [place]     History   Chief Complaint Chief Complaint  Patient presents with  . Vaginal Bleeding    HPI Hayley Bryant is a 46 y.o. female.  Patient presents with complaint of vaginal bleeding. She states that for the past 1-2 days she has seen blood on the tissue after going to the bathroom. She does not feel this is urinary or bowel. She wears a pad for mild urinary incontinence and has seen some spotting on the pad over the past couple of weeks. She has mild lower abdominal discomfort. No fever, nausea, back pain, urinary symptoms. She has not contacted her doctor for evaluation.    The history is provided by the patient.    Past Medical History:  Diagnosis Date  . Anxiety   . Depression   . Diabetes mellitus   . Fibroids   . Hypertension     Patient Active Problem List   Diagnosis Date Noted  . Suicidal overdose ([place]) 12/06/2012  . HTN (hypertension) 12/06/2012  . DM (diabetes mellitus) ([place]) 12/06/2012    Past Surgical History:  Procedure Laterality Date  . CHOLECYSTECTOMY    . LYMPH NODE DISSECTION      OB History    Gravida Para Term Preterm AB Living   5 3 3   2 3    SAB TAB Ectopic Multiple Live Births   1 1     3        Home Medications    Prior to Admission medications   Medication Sig Start Date End Date Taking? Authorizing Provider  ALPRAZolam Duanne Moron) 0.5 MG tablet Take 1 tablet up to one hour prior to MRI for anxiety, may repeat x 1 if needed. 09/24/16   Melvenia Beam, MD  aspirin EC 81 MG tablet Take 81 mg by mouth daily.      [provider]  aspirin-acetaminophen-caffeine (EXCEDRIN MIGRAINE) [phone] MG tablet Take 2 tablets by mouth daily as needed for headache or migraine.    [provider]  atorvastatin (LIPITOR) 10 MG tablet Take by mouth. 08/04/16 08/04/17  [provider]  cephALEXin (KEFLEX) 500 MG capsule Take 1 capsule (500  mg total) by mouth 2 (two) times daily. 04/13/16   Recardo Evangelist, PA-C  Cholecalciferol (VITAMIN D3) 2000 units capsule Take 2,000 Units by mouth daily.    [provider]  Cinnamon 500 MG capsule Take 1,000 mg by mouth daily.    [provider]  Dulaglutide (TRULICITY) 1.5 BT/5.9RC SOPN INJECT 0.5 MLS SUBCUTANEOUSLY ONCE A WEEK. START AFTER USING 0.75 MG FOR 4 WEEKS 07/08/16   [provider]  FLUoxetine (PROZAC) 20 MG capsule Take 20 mg by mouth daily. 04/05/15   [provider]  ibuprofen (ADVIL,MOTRIN) 800 MG tablet Take 1 tablet (800 mg total) by mouth 3 (three) times daily. 06/12/15   [name], PA-C  lisinopril-hydrochlorothiazide (PRINZIDE,ZESTORETIC) 20-25 MG per tablet Take 1 tablet by mouth daily. HOLD UNTIL FOLLOW UP WITH PRIMARY CARE DOCTOR Patient taking differently: Take 1 tablet by mouth daily.  12/07/12   Barton Dubois, MD  metroNIDAZOLE (FLAGYL) 500 MG tablet Take 1 tablet (500 mg total) by mouth 2 (two) times daily. 10/19/16   Charlann Lange, PA-C  naphazoline-pheniramine (NAPHCON-A) 0.025-0.3 % ophthalmic solution Place 1 drop into both eyes every 4 (four) hours as needed for irritation. 04/13/16   Recardo Evangelist, PA-C  sitaGLIPtin (JANUVIA) 50 MG  tablet Take 50 mg by mouth daily.    [provider]    Family History Family History  Problem Relation Age of Onset  . Hypertension Mother   . Heart attack Mother   . Huntington's disease Mother   . Huntington's disease Maternal Grandmother     Social History Social History  Substance Use Topics  . Smoking status: Never Smoker  . Smokeless tobacco: Never Used  . Alcohol use No     Allergies   Metformin and related   Review of Systems Review of Systems  Constitutional: Negative for chills and fever.  Respiratory: Negative.  Negative for shortness of breath.   Gastrointestinal: Positive for abdominal pain. Negative for nausea.  Genitourinary: Positive for vaginal  bleeding. Negative for dysuria.  Musculoskeletal: Negative.  Negative for back pain.  Skin: Negative.   Neurological: Negative.      Physical Exam Updated Vital Signs BP 109/78 (BP Location: Left Arm)   Pulse 83   Temp 98.8 F (37.1 C) (Oral)   Resp 14   SpO2 98%   Physical Exam  Constitutional: She is oriented to person, place, and time. She appears well-developed and well-nourished.  Neck: Normal range of motion.  Pulmonary/Chest: Effort normal.  Abdominal: Soft. She exhibits no distension. There is no tenderness.  Genitourinary: Vagina normal.  Genitourinary Comments: No cervical bleeding noted. No CMT, adnexal tenderness or mass. There is a small amount of discharge present that appears thin, and yellowish.   Neurological: She is alert and oriented to person, place, and time.  Skin: Skin is warm and dry.     ED Treatments / Results  Labs (all labs ordered are listed, but only abnormal results are displayed) Labs Reviewed  WET PREP, GENITAL - Abnormal; Notable for the following:       Result Value   Clue Cells Wet Prep HPF POC PRESENT (*)    WBC, Wet Prep HPF POC MODERATE (*)    All other components within normal limits  URINALYSIS, ROUTINE W REFLEX MICROSCOPIC - Abnormal; Notable for the following:    APPearance HAZY (*)    All other components within normal limits  PREGNANCY, URINE  GC/CHLAMYDIA PROBE AMP ([place]) NOT AT [facility]    EKG  EKG Interpretation None       Radiology No results found.  Procedures Procedures (including critical care time)  Medications Ordered in ED Medications - No data to display   Initial Impression / Assessment and Plan / ED Course  I have reviewed the triage vital signs and the nursing notes.  Pertinent labs & imaging results that were available during my care of the patient were reviewed by me and considered in my medical decision making (see chart for details).     Patient with c/o vaginal bleeding without  bleeding noted on exam. There is BV on wet prep that will be treated. She is encouraged to see her doctor if she sees further bleeding.  Final Clinical Impressions(s) / ED Diagnoses   Final diagnoses:  BV (bacterial vaginosis)    New Prescriptions New Prescriptions   METRONIDAZOLE (FLAGYL) 500 MG TABLET    Take 1 tablet (500 mg total) by mouth 2 (two) times daily.     Charlann Lange, PA-C 10/20/16 [place]    Charlesetta Shanks, MD 10/31/16 1331

## 2016-10-20 ENCOUNTER — Telehealth: Payer: Self-pay | Admitting: Neurology

## 2016-10-20 LAB — GC/CHLAMYDIA PROBE AMP (~~LOC~~) NOT AT ARMC
Chlamydia: NEGATIVE
Neisseria Gonorrhea: NEGATIVE

## 2016-10-20 NOTE — Telephone Encounter (Signed)
Pt is asking to be called with the results of the MRI on her brain as they become available

## 2016-10-20 NOTE — Telephone Encounter (Signed)
Please let her know the MRI did show these resulkts which may be seen in parkinson's disease and I recommend the follow up with [facility] thanks

## 2016-10-20 NOTE — Telephone Encounter (Signed)
MRI done 10/10/16 w/ following IMPRESSION:  This MRI of the brain with and without contrast shows the following: 1.    The basal ganglia have normal signal. However, the caudate heads are reduced in size resulting in an increased intercaudate distance.   This is often seen in Huntington's disease.  This can be correlated with the clinical exam and genetic testing. 2.     Minimal generalized cortical atrophy  3.    There is a normal enhancement pattern is there are no acute findings.  Pt has appt scheduled w/ [facility]ist on 12/02/16 and f/u w/ Dr. Jaynee Eagles 12/22/16.

## 2016-10-21 NOTE — Telephone Encounter (Signed)
Called pt w/ MRI results and recommendations for follow-up at [facility]. She has an appointment at Dr. Kellie Simmering office on December 02, 2016 at 1:00 pm at [place] pt T # and let her know that Dr. Gilford Rile is the only Huntington's Diease doctor there but that she is on the cancellation list for sooner appointment. Also reminded her of f/u appt scheduled w/ Dr. Jaynee Eagles 12/22/16. She verbalized understanding and appreciation for call.

## 2016-11-16 ENCOUNTER — Encounter (HOSPITAL_COMMUNITY): Payer: Self-pay | Admitting: Emergency Medicine

## 2016-11-16 ENCOUNTER — Emergency Department (HOSPITAL_COMMUNITY)
Admission: EM | Admit: 2016-11-16 | Discharge: 2016-11-16 | Disposition: A | Discharge status: Home / Self Care | Payer: Managed Care, Other (non HMO) | Attending: Emergency Medicine | Admitting: Emergency Medicine

## 2016-11-16 DIAGNOSIS — Y92410 Unspecified street and highway as the place of occurrence of the external cause: Secondary | ICD-10-CM | POA: Diagnosis not present

## 2016-11-16 DIAGNOSIS — Z7982 Long term (current) use of aspirin: Secondary | ICD-10-CM | POA: Insufficient documentation

## 2016-11-16 DIAGNOSIS — S46811A Strain of other muscles, fascia and tendons at shoulder and upper arm level, right arm, initial encounter: Secondary | ICD-10-CM

## 2016-11-16 DIAGNOSIS — I10 Essential (primary) hypertension: Secondary | ICD-10-CM | POA: Diagnosis not present

## 2016-11-16 DIAGNOSIS — Y939 Activity, unspecified: Secondary | ICD-10-CM | POA: Insufficient documentation

## 2016-11-16 DIAGNOSIS — Z79899 Other long term (current) drug therapy: Secondary | ICD-10-CM | POA: Diagnosis not present

## 2016-11-16 DIAGNOSIS — S46911A Strain of unspecified muscle, fascia and tendon at shoulder and upper arm level, right arm, initial encounter: Secondary | ICD-10-CM | POA: Diagnosis not present

## 2016-11-16 DIAGNOSIS — E119 Type 2 diabetes mellitus without complications: Secondary | ICD-10-CM | POA: Insufficient documentation

## 2016-11-16 DIAGNOSIS — S4991XA Unspecified injury of right shoulder and upper arm, initial encounter: Secondary | ICD-10-CM | POA: Diagnosis present

## 2016-11-16 DIAGNOSIS — Y999 Unspecified external cause status: Secondary | ICD-10-CM | POA: Diagnosis not present

## 2016-11-16 MED ORDER — CYCLOBENZAPRINE HCL 10 MG PO TABS
5.0000 mg | ORAL_TABLET | Freq: Once | ORAL | Status: AC
  Administered 2016-11-16: 5 mg via ORAL
  Filled 2016-11-16: qty 1

## 2016-11-16 MED ORDER — CYCLOBENZAPRINE HCL 10 MG PO TABS: 10.0000 mg | ORAL_TABLET | Freq: Two times a day (BID) | ORAL | 0 refills | Status: DC | PRN
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
## 2016-12-22 ENCOUNTER — Ambulatory Visit (INDEPENDENT_AMBULATORY_CARE_PROVIDER_SITE_OTHER): Payer: Self-pay | Admitting: Neurology

## 2016-12-22 ENCOUNTER — Encounter: Payer: Self-pay | Admitting: Neurology

## 2016-12-22 VITALS — BP 110/83 | HR 91 | Ht 69.0 in | Wt 185.0 lb

## 2016-12-22 DIAGNOSIS — G10 Huntington's disease: Secondary | ICD-10-CM

## 2016-12-23 NOTE — Progress Notes (Signed)
Left without being seen.

## 2017-05-07 DIAGNOSIS — E1165 Type 2 diabetes mellitus with hyperglycemia: Secondary | ICD-10-CM | POA: Diagnosis present

## 2017-05-12 DIAGNOSIS — G3189 Other specified degenerative diseases of nervous system: Secondary | ICD-10-CM | POA: Diagnosis present

## 2017-09-07 DIAGNOSIS — E1169 Type 2 diabetes mellitus with other specified complication: Secondary | ICD-10-CM | POA: Diagnosis present

## 2021-09-15 ENCOUNTER — Other Ambulatory Visit: Payer: Self-pay

## 2021-09-15 ENCOUNTER — Emergency Department (HOSPITAL_COMMUNITY)
Admission: EM | Admit: 2021-09-15 | Discharge: 2021-09-16 | Disposition: A | Discharge status: Home / Self Care | Payer: Managed Care, Other (non HMO) | Attending: Emergency Medicine | Admitting: Emergency Medicine

## 2021-09-15 ENCOUNTER — Encounter (HOSPITAL_COMMUNITY): Payer: Self-pay | Admitting: Emergency Medicine

## 2021-09-15 ENCOUNTER — Emergency Department (HOSPITAL_COMMUNITY): Payer: Managed Care, Other (non HMO)

## 2021-09-15 DIAGNOSIS — E119 Type 2 diabetes mellitus without complications: Secondary | ICD-10-CM | POA: Diagnosis not present

## 2021-09-15 DIAGNOSIS — Z7984 Long term (current) use of oral hypoglycemic drugs: Secondary | ICD-10-CM | POA: Insufficient documentation

## 2021-09-15 DIAGNOSIS — M542 Cervicalgia: Secondary | ICD-10-CM | POA: Insufficient documentation

## 2021-09-15 DIAGNOSIS — M25512 Pain in left shoulder: Secondary | ICD-10-CM | POA: Diagnosis present

## 2021-09-15 DIAGNOSIS — Z794 Long term (current) use of insulin: Secondary | ICD-10-CM | POA: Diagnosis not present

## 2021-09-15 DIAGNOSIS — Z79899 Other long term (current) drug therapy: Secondary | ICD-10-CM | POA: Insufficient documentation

## 2021-09-15 DIAGNOSIS — I10 Essential (primary) hypertension: Secondary | ICD-10-CM | POA: Diagnosis not present

## 2021-09-15 LAB — CBC
HCT: 35 % — ABNORMAL LOW (ref 36.0–46.0)
Hemoglobin: 10.6 g/dL — ABNORMAL LOW (ref 12.0–15.0)
MCH: 26.7 pg (ref 26.0–34.0)
MCHC: 30.3 g/dL (ref 30.0–36.0)
MCV: 88.2 fL (ref 80.0–100.0)
Platelets: 337 10*3/uL (ref 150–400)
RBC: 3.97 MIL/uL (ref 3.87–5.11)
RDW: 15 % (ref 11.5–15.5)
WBC: 6.7 10*3/uL (ref 4.0–10.5)
nRBC: 0 % (ref 0.0–0.2)

## 2021-09-15 LAB — BASIC METABOLIC PANEL
Anion gap: 6 (ref 5–15)
BUN: 8 mg/dL (ref 6–20)
CO2: 26 mmol/L (ref 22–32)
Calcium: 9.1 mg/dL (ref 8.9–10.3)
Chloride: 103 mmol/L (ref 98–111)
Creatinine, Ser: 0.65 mg/dL (ref 0.44–1.00)
GFR, Estimated: >60 mL/min (ref >60)
Glucose, Bld: 283 mg/dL — ABNORMAL HIGH (ref 70–99)
Potassium: 4 mmol/L (ref 3.5–5.1)
Sodium: 135 mmol/L (ref 135–145)

## 2021-09-15 LAB — I-STAT BETA HCG BLOOD, ED (MC, WL, AP ONLY): I-stat hCG, quantitative: <5 m[IU]/mL (ref <5)

## 2021-09-15 LAB — TROPONIN I (HIGH SENSITIVITY): Troponin I (High Sensitivity): 7 ng/L (ref <18)

## 2021-09-15 NOTE — ED Provider Triage Note (Signed)
Emergency Medicine Provider Triage Evaluation Note ? ?DONA WALBY , a 51 y.o. female  was evaluated in triage.  Pt complains of left-sided chest pain that radiates into her neck.  Her symptoms have been ongoing for 2 weeks. ?She does not take any medications currently.  She has a history of diabetes and hypertension.  She denies any shortness of breath.  She denies any specific injury. ? ? ?Physical Exam  ?BP (!) 206/107   Pulse 85   Temp 98.8 ?F (37.1 ?C) (Oral)   Resp 16   SpO2 100%  ?Gen:   Awake, no distress   ?Resp:  Normal effort  ?MSK:   Moves extremities without difficulty  ?Other:  Normal speech ? ?Medical Decision Making  ?Medically screening exam initiated at 7:47 PM.  Appropriate orders placed.  LADINA SHUTTERS was informed that the remainder of the evaluation will be completed by another provider, this initial triage assessment does not replace that evaluation, and the importance of remaining in the ED until their evaluation is complete. ? ?Patient is significantly hypertensive here.  While this sounds more like a musculoskeletal pain given the left-sided nature of it along with radiation into her neck and into her arm with her degree of hypertension we will check cardiac evaluation. ?  ?Lorin Glass, PA-C ?09/15/21 1949 ? ?

## 2021-09-15 NOTE — ED Triage Notes (Signed)
Pt reported to ED with c/o pain to left shoulder that starts in neck and radiates downwards into arm x2 weeks. Pt states pain had no triggering event, that onset began after awakening. Denies any nausea, vomiting, chest pain or shortness of breath. Denies any hx of same. States that pain increases with movement and she cannot use affected arm.  ?

## 2021-09-16 ENCOUNTER — Emergency Department (HOSPITAL_COMMUNITY): Payer: Managed Care, Other (non HMO)

## 2021-09-16 LAB — TROPONIN I (HIGH SENSITIVITY): Troponin I (High Sensitivity): 9 ng/L (ref <18)

## 2021-09-16 MED ORDER — EMPAGLIFLOZIN 10 MG PO TABS: 10.0000 mg | ORAL_TABLET | Freq: Every day | ORAL | 0 refills | Status: DC

## 2021-09-16 MED ORDER — BLOOD GLUCOSE MONITOR KIT: PACK | 0 refills | Status: AC

## 2021-09-16 MED ORDER — METHOCARBAMOL 500 MG PO TABS
500.0000 mg | ORAL_TABLET | Freq: Two times a day (BID) | ORAL | 0 refills | Status: DC
Start: 2021-09-16 — End: 2021-12-06

## 2021-09-16 MED ORDER — LISINOPRIL-HYDROCHLOROTHIAZIDE 20-25 MG PO TABS: 1.0000 | ORAL_TABLET | Freq: Every day | ORAL | 0 refills | Status: DC

## 2021-09-16 MED ORDER — KETOROLAC TROMETHAMINE 15 MG/ML IJ SOLN
15.0000 mg | Freq: Once | INTRAMUSCULAR | Status: AC
Start: 2021-09-16 — End: 2021-09-16
  Administered 2021-09-16: 15 mg via INTRAMUSCULAR
  Filled 2021-09-16: qty 1

## 2021-09-16 MED ORDER — TRULICITY 3 MG/0.5ML ~~LOC~~ SOAJ: 3.0000 mg | SUBCUTANEOUS | 0 refills | Status: DC

## 2021-09-16 MED ORDER — OXYCODONE-ACETAMINOPHEN 5-325 MG PO TABS
1.0000 | ORAL_TABLET | Freq: Once | ORAL | Status: AC
  Administered 2021-09-16: 1 via ORAL
  Filled 2021-09-16: qty 1

## 2021-09-16 MED ORDER — TOUJEO SOLOSTAR 300 UNIT/ML ~~LOC~~ SOPN: 17.0000 [IU] | PEN_INJECTOR | Freq: Every day | SUBCUTANEOUS | 0 refills | Status: DC

## 2021-09-16 MED ORDER — HYDROCHLOROTHIAZIDE 25 MG PO TABS
25.0000 mg | ORAL_TABLET | Freq: Every day | ORAL | Status: DC
  Administered 2021-09-16: 25 mg via ORAL
  Filled 2021-09-16: qty 1

## 2021-09-16 MED ORDER — LISINOPRIL 20 MG PO TABS
20.0000 mg | ORAL_TABLET | Freq: Once | ORAL | Status: AC
  Administered 2021-09-16: 20 mg via ORAL
  Filled 2021-09-16: qty 1

## 2021-09-16 NOTE — Discharge Instructions (Signed)
Please use Tylenol or ibuprofen for pain.  You may use 600 mg ibuprofen every 6 hours or 1000 mg of Tylenol every 6 hours.  You may choose to alternate between the 2.  This would be most effective.  Not to exceed 4 g of Tylenol within 24 hours.  Not to exceed 3200 mg ibuprofen 24 hours. ? ?You can use the muscle relaxant in addition to the above up to twice daily.  It may make you somewhat drowsy so I recommend caution before piloting a motor vehicle or operating any heavy machinery. ? ?You can buy some ice directly to the affected area, and I recommend some rehabilitation exercises to help with the pain. ?

## 2021-09-16 NOTE — ED Provider Notes (Signed)
?[facility] ?Provider Note ? ? ?CSN: [id number] ?Arrival date & time: 09/15/21  1900 ? ?  ? ?History ? ?Chief Complaint  ?Patient presents with  ? Shoulder Pain  ? ? ?Hayley Bryant is a 51 y.o. female with past medical history significant for hypertension, diabetes who presents with left-sided shoulder pain, neck pain for the last 2 weeks.  Patient reports that she is sleeping on a hard bed in her thinks that this is what exacerbated her pain.  Patient reports that she is also out of her blood pressure and diabetes medications because she has had difficulty getting back into a primary care doctor secondary to transportation difficulties.  We reviewed patient's home medications, she reports that she was taking lisinopril hydrochlorothiazide 20-25, her diabetes medications include Toujeo 17 units SQ nightly, Trulicity 3 mg injected into the skin once weekly, and Jardiance 10 mg daily.  Patient reports that she is also of needles, glucose meter.  She denies any chest pain, shortness of breath, nausea, vomiting, dizziness, blurred vision.  She reports that she has been taking ibuprofen for the pain in the shoulder with some minimal relief.  Patient denies any numbness, tingling of the left arm.  She reports that she has talked to an orthopedic doctor before but it is been a long time. ? ? ?Shoulder Pain ?Associated symptoms: neck pain   ? ?  ? ?Home Medications ?Prior to Admission medications   ?Medication Sig Start Date End Date Taking? Authorizing Provider  ?blood glucose meter kit and supplies KIT Dispense based on patient and insurance preference. Use up to four times daily as directed. 09/16/21  Yes Brayden Brodhead H, PA-C  ?Dulaglutide (TRULICITY) 3 WU/9.8JX SOPN Inject 3 mg as directed once a week. 09/16/21  Yes Kayloni Rocco H, PA-C  ?empagliflozin (JARDIANCE) 10 MG TABS tablet Take 1 tablet (10 mg total) by mouth daily. 09/16/21  Yes Lamesha Tibbits H, PA-C   ?ibuprofen (ADVIL) 200 MG tablet Take 400 mg by mouth every 6 (six) hours as needed for headache or mild pain.   Yes [provider]  ?insulin glargine, 1 Unit Dial, (TOUJEO SOLOSTAR) 300 UNIT/ML Solostar Pen Inject 17 Units into the skin at bedtime. 09/16/21  Yes Treson Laura H, PA-C  ?lisinopril-hydrochlorothiazide (ZESTORETIC) 20-25 MG tablet Take 1 tablet by mouth daily. 09/16/21  Yes Crislyn Willbanks H, PA-C  ?Melatonin 10 MG TABS Take 10 mg by mouth daily.   Yes [provider]  ?naphazoline-pheniramine (NAPHCON-A) 0.025-0.3 % ophthalmic solution Place 1 drop into both eyes every 4 (four) hours as needed for irritation. ?Patient not taking: Reported on 12/22/2016 04/13/16   Recardo Evangelist, PA-C  ?   ? ?Allergies    ?Metformin and related   ? ?Review of Systems   ?Review of Systems  ?Musculoskeletal:  Positive for myalgias and neck pain.  ?All other systems reviewed and are negative. ? ?Physical Exam ?Updated Vital Signs ?BP (!) 226/109   Pulse 75   Temp 98.4 ?F (36.9 ?C) (Oral)   Resp 16   SpO2 100%  ?Physical Exam ?Vitals and nursing note reviewed.  ?Constitutional:   ?   General: She is not in acute distress. ?   Appearance: Normal appearance.  ?HENT:  ?   Head: Normocephalic and atraumatic.  ?Eyes:  ?   General:     ?   Right eye: No discharge.     ?   Left eye: No discharge.  ?Cardiovascular:  ?  Rate and Rhythm: Normal rate and regular rhythm.  ?   Heart sounds: No murmur heard. ?  No friction rub. No gallop.  ?Pulmonary:  ?   Effort: Pulmonary effort is normal.  ?   Breath sounds: Normal breath sounds.  ?Abdominal:  ?   General: Bowel sounds are normal.  ?   Palpations: Abdomen is soft.  ?Musculoskeletal:  ?   Comments: Patient with some significant tenderness palpation, tightness noted of the left cervical paraspinous muscles, trapezius.  She has no midline spinal tenderness.  She has intact range of motion and 5/5 strength of the left arm, but decreased active range  of motion secondary to pain.  She has no tenderness to palpation of the chest wall.  ?Skin: ?   General: Skin is warm and dry.  ?   Capillary Refill: Capillary refill takes less than 2 seconds.  ?Neurological:  ?   Mental Status: She is alert and oriented to person, place, and time.  ?Psychiatric:     ?   Mood and Affect: Mood normal.     ?   Behavior: Behavior normal.  ? ? ?ED Results / Procedures / Treatments   ?Labs ?(all labs ordered are listed, but only abnormal results are displayed) ?Labs Reviewed  ?BASIC METABOLIC PANEL - Abnormal; Notable for the following components:  ?    Result Value  ? Glucose, Bld 283 (*)   ? All other components within normal limits  ?CBC - Abnormal; Notable for the following components:  ? Hemoglobin 10.6 (*)   ? HCT 35.0 (*)   ? All other components within normal limits  ?I-STAT BETA HCG BLOOD, ED ([facility], [facility], [facility] ONLY)  ?TROPONIN I (HIGH SENSITIVITY)  ?TROPONIN I (HIGH SENSITIVITY)  ? ? ?EKG ?None ? ?Radiology ?DG Chest 2 View ? ?Result Date: 09/15/2021 ?CLINICAL DATA:  Left chest pain EXAM: CHEST - 2 VIEW COMPARISON:  None. FINDINGS: The heart size and mediastinal contours are within normal limits. Both lungs are clear. The visualized skeletal structures are unremarkable. IMPRESSION: No active cardiopulmonary disease. Electronically Signed   By: Fidela Salisbury M.D.   On: 09/15/2021 20:33  ? ?DG Shoulder Left ? ?Result Date: 09/16/2021 ?CLINICAL DATA:  Worsening left shoulder pain. EXAM: LEFT SHOULDER - 2+ VIEW COMPARISON:  None. FINDINGS: There is no evidence of fracture or dislocation. There is no evidence of arthropathy or other focal bone abnormality. Soft tissues are unremarkable. IMPRESSION: Negative. Electronically Signed   By: Virgina Norfolk M.D.   On: 09/16/2021 00:28   ? ?Procedures ?Procedures  ? ? ?Medications Ordered in ED ?Medications  ?hydrochlorothiazide (HYDRODIURIL) tablet 25 mg (25 mg Oral Given 09/16/21 0902)  ?ketorolac (TORADOL) 15 MG/ML injection 15 mg (15 mg  Intramuscular Given 09/16/21 0901)  ?oxyCODONE-acetaminophen (PERCOCET/ROXICET) 5-325 MG per tablet 1 tablet (1 tablet Oral Given 09/16/21 0901)  ?lisinopril (ZESTRIL) tablet 20 mg (20 mg Oral Given 09/16/21 0902)  ? ? ?ED Course/ Medical Decision Making/ A&P ?  ?                        ?Medical Decision Making ?Amount and/or Complexity of Data Reviewed ?Labs: ordered. ?Radiology: ordered. ? ?Risk ?Prescription drug management. ? ? ? ?This patient presents to the ED for concern of left shoulder pain, this involves an extensive number of treatment options, and is a complaint that carries with it a high risk of complications and morbidity. The emergent differential diagnosis prior to evaluation includes,  but is not limited to,  MSK pain, cervical strain, shoulder dislocation, fracture, atypical ACS .  ? ?This is not an exhaustive differential.  ? ?Past Medical History / Co-morbidities / Social History: ?Diabetes, hypertension, current inability to comply with her medications secondary to transportation issues getting to and from her primary care doctor. ? ?Additional history: ?External records from outside source obtained and reviewed including previous PCP notes.  I reviewed notes to find out patient's diabetes medications for refill. ? ?Physical Exam: ?Physical exam performed. The pertinent findings include: Patient with no chest pain tenderness, she does have tenderness palpation in the paraspinous muscles of the cervical spine and left shoulder consistent with musculoskeletal strain, inflammation. ? ?Lab Tests: ?I ordered, and personally interpreted labs.  The pertinent results include: BC with mild anemia, hemoglobin 10.6.  BMP notable for glucose of 283.  Delta troponin is negative x2.  Patient is not pregnant. ?  ?Imaging Studies: ?I ordered imaging studies including plain film radiograph the chest, radiographs of the left shoulder. I independently visualized and interpreted imaging which showed no evidence of  intrathoracic abnormality, fracture or dislocation of the left shoulder. I agree with the radiologist interpretation. ?  ?Cardiac Monitoring:  ?The patient was maintained on a cardiac monitor.  My attending physicia

## 2021-09-16 NOTE — ED Notes (Signed)
Pt verbalizes understanding of discharge instructions. Opportunity for questions and answers were provided. Pt discharged from the ED.   ?

## 2021-11-15 ENCOUNTER — Other Ambulatory Visit: Payer: Self-pay

## 2021-11-15 ENCOUNTER — Emergency Department (HOSPITAL_COMMUNITY): Payer: Managed Care, Other (non HMO)

## 2021-11-15 ENCOUNTER — Encounter (HOSPITAL_COMMUNITY): Payer: Self-pay

## 2021-11-15 ENCOUNTER — Inpatient Hospital Stay (HOSPITAL_COMMUNITY)
Admission: EM | Admit: 2021-11-15 | Discharge: 2021-11-22 | DRG: 065 | Disposition: A | Discharge status: Rehabilitation Facility | Payer: Managed Care, Other (non HMO) | Attending: Internal Medicine | Admitting: Internal Medicine

## 2021-11-15 DIAGNOSIS — E559 Vitamin D deficiency, unspecified: Secondary | ICD-10-CM | POA: Diagnosis present

## 2021-11-15 DIAGNOSIS — Z7985 Long-term (current) use of injectable non-insulin antidiabetic drugs: Secondary | ICD-10-CM

## 2021-11-15 DIAGNOSIS — I160 Hypertensive urgency: Secondary | ICD-10-CM | POA: Diagnosis present

## 2021-11-15 DIAGNOSIS — F32A Depression, unspecified: Secondary | ICD-10-CM | POA: Diagnosis present

## 2021-11-15 DIAGNOSIS — E785 Hyperlipidemia, unspecified: Secondary | ICD-10-CM | POA: Diagnosis present

## 2021-11-15 DIAGNOSIS — Z79890 Hormone replacement therapy (postmenopausal): Secondary | ICD-10-CM

## 2021-11-15 DIAGNOSIS — R29703 NIHSS score 3: Secondary | ICD-10-CM | POA: Diagnosis present

## 2021-11-15 DIAGNOSIS — R21 Rash and other nonspecific skin eruption: Secondary | ICD-10-CM | POA: Diagnosis not present

## 2021-11-15 DIAGNOSIS — Z9151 Personal history of suicidal behavior: Secondary | ICD-10-CM

## 2021-11-15 DIAGNOSIS — Z9049 Acquired absence of other specified parts of digestive tract: Secondary | ICD-10-CM

## 2021-11-15 DIAGNOSIS — R4701 Aphasia: Secondary | ICD-10-CM | POA: Diagnosis present

## 2021-11-15 DIAGNOSIS — M19012 Primary osteoarthritis, left shoulder: Secondary | ICD-10-CM | POA: Diagnosis not present

## 2021-11-15 DIAGNOSIS — F419 Anxiety disorder, unspecified: Secondary | ICD-10-CM | POA: Diagnosis present

## 2021-11-15 DIAGNOSIS — Z6825 Body mass index (BMI) 25.0-25.9, adult: Secondary | ICD-10-CM

## 2021-11-15 DIAGNOSIS — Z888 Allergy status to other drugs, medicaments and biological substances status: Secondary | ICD-10-CM | POA: Diagnosis not present

## 2021-11-15 DIAGNOSIS — I6389 Other cerebral infarction: Principal | ICD-10-CM | POA: Diagnosis present

## 2021-11-15 DIAGNOSIS — Z20822 Contact with and (suspected) exposure to covid-19: Secondary | ICD-10-CM | POA: Diagnosis present

## 2021-11-15 DIAGNOSIS — E119 Type 2 diabetes mellitus without complications: Secondary | ICD-10-CM | POA: Diagnosis not present

## 2021-11-15 DIAGNOSIS — I639 Cerebral infarction, unspecified: Secondary | ICD-10-CM | POA: Diagnosis present

## 2021-11-15 DIAGNOSIS — M25561 Pain in right knee: Secondary | ICD-10-CM | POA: Diagnosis not present

## 2021-11-15 DIAGNOSIS — G10 Huntington's disease: Secondary | ICD-10-CM | POA: Diagnosis present

## 2021-11-15 DIAGNOSIS — R471 Dysarthria and anarthria: Secondary | ICD-10-CM | POA: Diagnosis present

## 2021-11-15 DIAGNOSIS — G8191 Hemiplegia, unspecified affecting right dominant side: Secondary | ICD-10-CM | POA: Diagnosis present

## 2021-11-15 DIAGNOSIS — I10 Essential (primary) hypertension: Secondary | ICD-10-CM | POA: Diagnosis present

## 2021-11-15 DIAGNOSIS — R7989 Other specified abnormal findings of blood chemistry: Secondary | ICD-10-CM | POA: Diagnosis present

## 2021-11-15 DIAGNOSIS — E1169 Type 2 diabetes mellitus with other specified complication: Secondary | ICD-10-CM | POA: Diagnosis present

## 2021-11-15 DIAGNOSIS — E876 Hypokalemia: Secondary | ICD-10-CM | POA: Diagnosis present

## 2021-11-15 DIAGNOSIS — E1165 Type 2 diabetes mellitus with hyperglycemia: Secondary | ICD-10-CM | POA: Diagnosis present

## 2021-11-15 DIAGNOSIS — Z79899 Other long term (current) drug therapy: Secondary | ICD-10-CM | POA: Diagnosis not present

## 2021-11-15 DIAGNOSIS — E663 Overweight: Secondary | ICD-10-CM | POA: Diagnosis present

## 2021-11-15 DIAGNOSIS — R29810 Facial weakness: Secondary | ICD-10-CM | POA: Diagnosis present

## 2021-11-15 DIAGNOSIS — G4700 Insomnia, unspecified: Secondary | ICD-10-CM | POA: Diagnosis present

## 2021-11-15 DIAGNOSIS — E8809 Other disorders of plasma-protein metabolism, not elsewhere classified: Secondary | ICD-10-CM | POA: Diagnosis not present

## 2021-11-15 DIAGNOSIS — R519 Headache, unspecified: Secondary | ICD-10-CM | POA: Diagnosis not present

## 2021-11-15 DIAGNOSIS — Z794 Long term (current) use of insulin: Secondary | ICD-10-CM

## 2021-11-15 DIAGNOSIS — Z8249 Family history of ischemic heart disease and other diseases of the circulatory system: Secondary | ICD-10-CM

## 2021-11-15 DIAGNOSIS — R531 Weakness: Secondary | ICD-10-CM | POA: Diagnosis not present

## 2021-11-15 DIAGNOSIS — G3189 Other specified degenerative diseases of nervous system: Secondary | ICD-10-CM | POA: Diagnosis present

## 2021-11-15 DIAGNOSIS — R2990 Unspecified symptoms and signs involving the nervous system: Secondary | ICD-10-CM | POA: Diagnosis not present

## 2021-11-15 DIAGNOSIS — G8929 Other chronic pain: Secondary | ICD-10-CM | POA: Diagnosis not present

## 2021-11-15 DIAGNOSIS — M25512 Pain in left shoulder: Secondary | ICD-10-CM | POA: Diagnosis not present

## 2021-11-15 DIAGNOSIS — G9340 Encephalopathy, unspecified: Secondary | ICD-10-CM | POA: Diagnosis present

## 2021-11-15 LAB — COMPREHENSIVE METABOLIC PANEL
ALT: 24 U/L (ref 0–44)
AST: 52 U/L — ABNORMAL HIGH (ref 15–41)
Albumin: 4.6 g/dL (ref 3.5–5.0)
Alkaline Phosphatase: 61 U/L (ref 38–126)
Anion gap: 8 (ref 5–15)
BUN: 10 mg/dL (ref 6–20)
CO2: 23 mmol/L (ref 22–32)
Calcium: 9.2 mg/dL (ref 8.9–10.3)
Chloride: 105 mmol/L (ref 98–111)
Creatinine, Ser: 0.58 mg/dL (ref 0.44–1.00)
GFR, Estimated: >60 mL/min (ref >60)
Glucose, Bld: 166 mg/dL — ABNORMAL HIGH (ref 70–99)
Potassium: 4.6 mmol/L (ref 3.5–5.1)
Sodium: 136 mmol/L (ref 135–145)
Total Bilirubin: 1.6 mg/dL — ABNORMAL HIGH (ref 0.3–1.2)
Total Protein: 8.5 g/dL — ABNORMAL HIGH (ref 6.5–8.1)

## 2021-11-15 LAB — CBC
HCT: 35.7 % — ABNORMAL LOW (ref 36.0–46.0)
Hemoglobin: 11.3 g/dL — ABNORMAL LOW (ref 12.0–15.0)
MCH: 27.4 pg (ref 26.0–34.0)
MCHC: 31.7 g/dL (ref 30.0–36.0)
MCV: 86.4 fL (ref 80.0–100.0)
Platelets: 415 10*3/uL — ABNORMAL HIGH (ref 150–400)
RBC: 4.13 MIL/uL (ref 3.87–5.11)
RDW: 14.7 % (ref 11.5–15.5)
WBC: 8.9 10*3/uL (ref 4.0–10.5)
nRBC: 0 % (ref 0.0–0.2)

## 2021-11-15 LAB — DIFFERENTIAL
Abs Immature Granulocytes: 0.02 10*3/uL (ref 0.00–0.07)
Basophils Absolute: 0 10*3/uL (ref 0.0–0.1)
Basophils Relative: 1 %
Eosinophils Absolute: 0 10*3/uL (ref 0.0–0.5)
Eosinophils Relative: 0 %
Immature Granulocytes: 0 %
Lymphocytes Relative: 20 %
Lymphs Abs: 1.8 10*3/uL (ref 0.7–4.0)
Monocytes Absolute: 0.5 10*3/uL (ref 0.1–1.0)
Monocytes Relative: 5 %
Neutro Abs: 6.6 10*3/uL (ref 1.7–7.7)
Neutrophils Relative %: 74 %

## 2021-11-15 LAB — URINALYSIS, ROUTINE W REFLEX MICROSCOPIC
Bilirubin Urine: NEGATIVE
Glucose, UA: NEGATIVE mg/dL
Hgb urine dipstick: NEGATIVE
Ketones, ur: 5 mg/dL — AB
Leukocytes,Ua: NEGATIVE
Nitrite: NEGATIVE
Protein, ur: NEGATIVE mg/dL
Specific Gravity, Urine: 1.019 (ref 1.005–1.030)
pH: 7 (ref 5.0–8.0)

## 2021-11-15 LAB — HCG, QUANTITATIVE, PREGNANCY: hCG, Beta Chain, Quant, S: <1 m[IU]/mL (ref <5)

## 2021-11-15 LAB — PROTIME-INR
INR: 1 (ref 0.8–1.2)
Prothrombin Time: 13.2 seconds (ref 11.4–15.2)

## 2021-11-15 LAB — APTT: aPTT: 26 seconds (ref 24–36)

## 2021-11-15 LAB — RAPID URINE DRUG SCREEN, HOSP PERFORMED
Amphetamines: NOT DETECTED
Barbiturates: NOT DETECTED
Benzodiazepines: NOT DETECTED
Cocaine: NOT DETECTED
Opiates: NOT DETECTED
Tetrahydrocannabinol: NOT DETECTED

## 2021-11-15 LAB — RESP PANEL BY RT-PCR (FLU A&B, COVID) ARPGX2
Influenza A by PCR: NEGATIVE
Influenza B by PCR: NEGATIVE
SARS Coronavirus 2 by RT PCR: NEGATIVE

## 2021-11-15 LAB — ETHANOL: Alcohol, Ethyl (B): <10 mg/dL (ref <10)

## 2021-11-15 LAB — HEMOGLOBIN A1C
Hgb A1c MFr Bld: 8 % — ABNORMAL HIGH (ref 4.8–5.6)
Mean Plasma Glucose: 182.9 mg/dL

## 2021-11-15 LAB — CBG MONITORING, ED: Glucose-Capillary: 171 mg/dL — ABNORMAL HIGH (ref 70–99)

## 2021-11-15 MED ORDER — CLOPIDOGREL BISULFATE 300 MG PO TABS
300.0000 mg | ORAL_TABLET | Freq: Once | ORAL | Status: AC
  Administered 2021-11-15: 300 mg via ORAL
  Filled 2021-11-15: qty 1

## 2021-11-15 MED ORDER — ASPIRIN 300 MG RE SUPP
300.0000 mg | Freq: Every day | RECTAL | Status: DC
  Filled 2021-11-15: qty 1

## 2021-11-15 MED ORDER — ONDANSETRON HCL 4 MG/2ML IJ SOLN: 4.0000 mg | Freq: Four times a day (QID) | INTRAMUSCULAR | Status: DC | PRN

## 2021-11-15 MED ORDER — INSULIN GLARGINE-YFGN 100 UNIT/ML ~~LOC~~ SOLN
17.0000 [IU] | Freq: Every day | SUBCUTANEOUS | Status: DC
  Administered 2021-11-16 – 2021-11-22 (×7): 17 [IU] via SUBCUTANEOUS
  Filled 2021-11-15 (×7): qty 0.17

## 2021-11-15 MED ORDER — MELATONIN 5 MG PO TABS
10.0000 mg | ORAL_TABLET | Freq: Every evening | ORAL | Status: DC | PRN
  Administered 2021-11-16 – 2021-11-21 (×4): 10 mg via ORAL
  Filled 2021-11-15 (×4): qty 2

## 2021-11-15 MED ORDER — ATORVASTATIN CALCIUM 10 MG PO TABS
20.0000 mg | ORAL_TABLET | Freq: Every day | ORAL | Status: DC
  Administered 2021-11-16: 20 mg via ORAL
  Filled 2021-11-15: qty 2

## 2021-11-15 MED ORDER — INSULIN ASPART 100 UNIT/ML IJ SOLN: 0.0000 [IU] | Freq: Four times a day (QID) | INTRAMUSCULAR | Status: DC

## 2021-11-15 MED ORDER — SODIUM CHLORIDE (PF) 0.9 % IJ SOLN
INTRAMUSCULAR | Status: AC
  Filled 2021-11-15: qty 100

## 2021-11-15 MED ORDER — ACETAMINOPHEN 325 MG PO TABS
650.0000 mg | ORAL_TABLET | Freq: Four times a day (QID) | ORAL | Status: DC | PRN
  Administered 2021-11-18 – 2021-11-19 (×2): 650 mg via ORAL
  Filled 2021-11-15 (×2): qty 2

## 2021-11-15 MED ORDER — IOHEXOL 350 MG/ML SOLN
65.0000 mL | Freq: Once | INTRAVENOUS | Status: AC | PRN
  Administered 2021-11-15: 65 mL via INTRAVENOUS

## 2021-11-15 MED ORDER — GADOBUTROL 1 MMOL/ML IV SOLN
7.0000 mL | Freq: Once | INTRAVENOUS | Status: AC | PRN
  Administered 2021-11-15: 7 mL via INTRAVENOUS

## 2021-11-15 MED ORDER — FLUOXETINE HCL 20 MG PO CAPS
60.0000 mg | ORAL_CAPSULE | Freq: Every day | ORAL | Status: DC
  Administered 2021-11-16 – 2021-11-22 (×7): 60 mg via ORAL
  Filled 2021-11-15 (×7): qty 3

## 2021-11-15 MED ORDER — LORAZEPAM 2 MG/ML IJ SOLN
1.0000 mg | Freq: Once | INTRAMUSCULAR | Status: AC
  Administered 2021-11-15: 1 mg via INTRAVENOUS
  Filled 2021-11-15: qty 1

## 2021-11-15 MED ORDER — STROKE: EARLY STAGES OF RECOVERY BOOK
Freq: Once | Status: AC
Start: 2021-11-16 — End: 2021-11-16
  Filled 2021-11-15: qty 1

## 2021-11-15 MED ORDER — ASPIRIN 325 MG PO TABS
325.0000 mg | ORAL_TABLET | Freq: Every day | ORAL | Status: DC
  Administered 2021-11-15 – 2021-11-16 (×2): 325 mg via ORAL
  Filled 2021-11-15 (×2): qty 1

## 2021-11-15 MED ORDER — ONDANSETRON HCL 4 MG PO TABS: 4.0000 mg | ORAL_TABLET | Freq: Four times a day (QID) | ORAL | Status: DC | PRN

## 2021-11-15 MED ORDER — METHOCARBAMOL 500 MG PO TABS
500.0000 mg | ORAL_TABLET | Freq: Two times a day (BID) | ORAL | Status: DC | PRN
Start: 2021-11-15 — End: 2021-11-22
  Administered 2021-11-19: 500 mg via ORAL
  Filled 2021-11-15 (×2): qty 1

## 2021-11-15 MED ORDER — METRONIDAZOLE 500 MG PO TABS
500.0000 mg | ORAL_TABLET | Freq: Two times a day (BID) | ORAL | Status: DC
  Administered 2021-11-16 – 2021-11-18 (×6): 500 mg via ORAL
  Filled 2021-11-15 (×6): qty 1

## 2021-11-15 MED ORDER — CLOPIDOGREL BISULFATE 75 MG PO TABS
75.0000 mg | ORAL_TABLET | Freq: Every day | ORAL | Status: DC
  Administered 2021-11-16 – 2021-11-22 (×7): 75 mg via ORAL
  Filled 2021-11-15 (×7): qty 1

## 2021-11-15 MED ORDER — ACETAMINOPHEN 650 MG RE SUPP: 650.0000 mg | Freq: Four times a day (QID) | RECTAL | Status: DC | PRN

## 2021-11-15 MED ORDER — SODIUM CHLORIDE 0.9 % IV SOLN: INTRAVENOUS | Status: AC

## 2021-11-15 NOTE — Progress Notes (Signed)
Patient to CT department 

## 2021-11-15 NOTE — Consult Note (Signed)
[facility] Telemedicine Consult   Requesting Provider: Teressa Lower Consult Participants: Myself, bedside nurse, atrium nurse, son via phone Location of the provider: [facility]  Location of the patient: [place]  This consult was provided via telemedicine with 2-way video and audio communication. The patient/family was informed that care would be provided in this way and agreed to receive care in this manner.    Chief Complaint: Slurred speech and confusion   History is primarily obtained from the son via phone as well as chart review given the patient's paucity of speech  HPI: Ms. Hayley Bryant is a 51 year old woman presenting with confusion and paucity of speech since this morning, with past medical history significant for hypertension, hyperlipidemia, Huntington's disease, diabetes  Son reports that the last time 47 spoke to the patient was 10 PM last evening when she asked for something to eat.  This morning when she woke up she was confused about how to put on her clothes and required his assistance with dressing.  She has been complaining of some right arm pain for 1 week as well, but no other recent complaints that he is aware of.  At baseline she walks with a walker as well as some assistance.  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  She requires some assistance with bathing to wash fully but is continent.  LKW: 10 PM on 6/15 tpa given?: No, out of the window IR Thrombectomy? No, no LVO Modified Rankin Scale: 4-Needs assistance to walk and tend to bodily needs Time of teleneurologist evaluation: 11:09 AM  Exam: Vitals:   11/15/21 1145 11/15/21 1153  BP: (!) 233/108   Pulse: 89   Resp: (!) 22   Temp:  98.5 F (36.9 C)  SpO2: 99%     General: Flat affect, no acute distress Pulmonary: breathing comfortably Cardiac: regular rate and rhythm on monitor   NIH Stroke scale 1A:  Level of Consciousness - 0 1B: Ask Month and Age - 0 1C: 'Blink Eyes' & 'Squeeze Hands' - 0 2: Test Horizontal Extraocular Movements - 0 3: Test Visual Fields - 0 4: Test Facial Palsy - 1 5A: Test Left Arm Motor Drift - 1 (more motor impersistence secondary to chorea) 5B: Test Right Arm Motor Drift - 1 (more motor impersistence secondary to chorea) 6A: Test Left Leg Motor Drift - 0 6B: Test Right Leg Motor Drift - 0 7: Test Limb Ataxia - 2 (again this appears to be her baseline chorea) 8: Test Sensation - 0 9: Test Language/Aphasia- 1 (able to name glasses and lens but not frame for example, able to repeat simple sentences but some hesitation for complex sentences; significantly delayed latency to response to questions) 10: Test Dysarthria - 1 speech is slightly slurred 11: Test Extinction/Inattention - 0 NIHSS score: 3 for new deficits (facial droop, language impairment, dysarthria), 7 if likely chronic deficits caused by chorea are included   Imaging Reviewed:  Head CT personally reviewed, agree with radiology no clear acute intracranial process though there is some microvascular disease CTA personally reviewed, agree with radiology no LVO or significant stenosis or dissection  Labs reviewed in epic and pertinent values follow:  Basic Metabolic Panel: Recent Labs  Lab 11/15/21 1057  NA 136  K 4.6  CL 105  CO2 23  GLUCOSE 166*  BUN 10  CREATININE 0.58  CALCIUM 9.2    CBC: Recent  Labs  Lab 11/15/21 1057  WBC 8.9  NEUTROABS 6.6  HGB 11.3*  HCT 35.7*  MCV 86.4  PLT 415*    Coagulation Studies: Recent Labs    11/15/21 1057  LABPROT 13.2  INR 1.0     Assessment: Patient presenting with acute onset speech changes and confusion.  Differential includes stroke, hypertensive encephalopathy, PRES, seizure with postictal state, less likely toxic/metabolic process given her reassuring labs  Recommendations:  -MRI brain with and without contrast -If this is positive  for stroke, full neurology consultation should be obtained -If this is negative for stroke, patient should be treated as a hypertensive emergency (CNS endorgan dysfunction given her encephalopathy), and EEG should be obtained as well  -Given potential need for EEG and neurology follow-up, admission to [facility] is favored over [facility]  This patient is receiving care for possible acute neurological changes. There was 45 minutes of care by this provider at the time of service, including time for direct evaluation via telemedicine, review of medical records, imaging studies and discussion of findings with providers, the patient and/or family.   Lesleigh Noe MD-PhD [facility] [phone]   If 8pm-8am, please page neurology on call as listed in [place].   CRITICAL CARE Performed by: Lorenza Chick   Total critical care time: 45 minutes  Critical care time was exclusive of separately billable procedures and treating other patients.  Critical care was necessary to treat or prevent imminent or life-threatening deterioration, emergent evaluation for potential treatment with thrombolytic or intra-arterial thrombectomy.  Critical care was time spent personally by me on the following activities: development of treatment plan with patient and/or surrogate as well as nursing, discussions with consultants, evaluation of patient's response to treatment, examination of patient, obtaining history from patient or surrogate, ordering and performing treatments and interventions, ordering and review of laboratory studies, ordering and review of radiographic studies, pulse oximetry and re-evaluation of patient's condition.

## 2021-11-15 NOTE — H&P (Signed)
History and Physical    Patient: Hayley Bryant:[id number] DOB: Sep 11, 1970 DOA: 11/15/2021 DOS: the patient was seen and examined on 11/15/2021 PCP: Barrie Lyme, FNP  Patient coming from: Home  Chief Complaint:  Chief Complaint  Patient presents with   Aphasia   HPI: Hayley Bryant is a 51 y.o. female with medical history significant of anxiety, depression, insomnia, suicidal overdose, vitamin D deficiency, overweight with a BMI of 25.06 kg/m, uncontrolled type II DM, hypertension, Huntington's-like disease who is brought to the emergency department after waking up in the morning with mild confusion, slurred speech and inability to communicate normally.  LKW was yesterday evening.  She is unable to elaborate due to dysarthria, but is able to answer simple questions.  No headache, blurred vision, dyspnea, back, chest or abdominal pain.  She follows commands.  ED course: Initial vital signs were temperature 98.5 F and O2 sat 100% on room air., pulse 86, respiration 26, BP226/120 mmHg and O2 sat% 100%on room air.  Lab work: Urinalysis showed ketonuria 5 mg deciliter but is otherwise normal.  UDS was negative.   CBC showed a white count of 8.9, hemoglobin 11.3 g/dL platelets 415.   Unremarkable PT, INR and PTT.CMP with a glucose of 166  and bilirubin of 1.10m/dL, total protein 8.5 g/dL and AST 52.  The rest of the CMP measurements were normal.  Alcohol level was normal.  Imaging: Code stroke CT head was negative.  CTA head and neck with no large vessel occlusion, hemodynamically significant stenosis or evidence of dissection.  MRI brain with acute small vessel infarct involving the left basal ganglia and adjacent white matter.   Review of Systems: As mentioned in the history of present illness. All other systems reviewed and are negative. Past Medical History:  Diagnosis Date   Anxiety    Depression    Diabetes mellitus    Fibroids    Hypertension    Past Surgical History:   Procedure Laterality Date   CHOLECYSTECTOMY     LYMPH NODE DISSECTION     Social History:  reports that she has never smoked. She has never used smokeless tobacco. She reports that she does not drink alcohol and does not use drugs.  Allergies  Allergen Reactions   Metformin And Related Other (See Comments)    Pt. States it makes her heart flutter.     Family History  Problem Relation Age of Onset   Hypertension Mother    Heart attack Mother    Huntington's disease Mother    Huntington's disease Maternal Grandmother     Prior to Admission medications   Medication Sig Start Date End Date Taking? Authorizing Provider  acetaminophen (TYLENOL) 500 MG tablet Take 1,000 mg by mouth every 6 (six) hours as needed.   Yes [provider]  atorvastatin (LIPITOR) 20 MG tablet Take 20 mg by mouth daily. 11/09/21  Yes [provider]  FLUoxetine (PROZAC) 20 MG capsule Take 60 mg by mouth daily. 11/09/21  Yes [provider]  ibuprofen (ADVIL) 200 MG tablet Take 800 mg by mouth every 6 (six) hours as needed for headache or mild pain.   Yes [provider]  Insulin Glargine (BASAGLAR KWIKPEN) 100 UNIT/ML Inject 17 Units into the skin daily. 11/04/21  Yes [provider]  Melatonin 10 MG TABS Take 10 mg by mouth daily.   Yes [provider]  methocarbamol (ROBAXIN) 500 MG tablet Take 1 tablet (500 mg total) by mouth 2 (two)  times daily. Patient taking differently: Take 500 mg by mouth 2 (two) times daily as needed for muscle spasms. 09/16/21  Yes Prosperi, Christian H, PA-C  metroNIDAZOLE (FLAGYL) 500 MG tablet Take 500 mg by mouth 2 (two) times daily. 11/08/21  Yes [provider]  blood glucose meter kit and supplies KIT Dispense based on patient and insurance preference. Use up to four times daily as directed. 09/16/21   Prosperi, Christian H, PA-C  Dulaglutide (TRULICITY) 3 QH/4.7ML SOPN Inject 3 mg as directed once a week. Patient not  taking: Reported on 11/15/2021 09/16/21   Prosperi, Christian H, PA-C  empagliflozin (JARDIANCE) 10 MG TABS tablet Take 1 tablet (10 mg total) by mouth daily. Patient not taking: Reported on 11/15/2021 09/16/21   Prosperi, Christian H, PA-C  insulin glargine, 1 Unit Dial, (TOUJEO SOLOSTAR) 300 UNIT/ML Solostar Pen Inject 17 Units into the skin at bedtime. Patient not taking: Reported on 11/15/2021 09/16/21   Prosperi, Christian H, PA-C  lisinopril-hydrochlorothiazide (ZESTORETIC) 20-25 MG tablet Take 1 tablet by mouth daily. Patient not taking: Reported on 11/15/2021 09/16/21   Prosperi, Christian H, PA-C  naphazoline-pheniramine (NAPHCON-A) 0.025-0.3 % ophthalmic solution Place 1 drop into both eyes every 4 (four) hours as needed for irritation. Patient not taking: Reported on 12/22/2016 04/13/16   Recardo Evangelist, PA-C    Physical Exam: Vitals:   11/15/21 1400 11/15/21 1530 11/15/21 1615 11/15/21 1830  BP: (!) 196/97 (!) 186/103 (!) 153/90 (!) 164/88  Pulse: 87 83 71 61  Resp: 16 20 16 16   Temp:  98.9 F (37.2 C)    TempSrc:  Oral    SpO2: 97% 100% 100% 100%  Weight:      Height:       Physical Exam Vitals and nursing note reviewed.  HENT:     Head: Normocephalic.     Mouth/Throat:     Mouth: Mucous membranes are moist.  Eyes:     General: No scleral icterus.    Pupils: Pupils are equal, round, and reactive to light.  Neck:     Vascular: No JVD.  Cardiovascular:     Rate and Rhythm: Normal rate and regular rhythm.     Heart sounds: S1 normal and S2 normal.  Pulmonary:     Effort: Pulmonary effort is normal.     Breath sounds: Normal breath sounds.  Abdominal:     General: Bowel sounds are normal. There is no distension.     Palpations: Abdomen is soft.     Tenderness: There is no abdominal tenderness. There is no right CVA tenderness, left CVA tenderness or guarding.  Musculoskeletal:     Cervical back: Neck supple.     Right lower leg: No edema.     Left lower leg: No  edema.  Skin:    General: Skin is warm and dry.  Neurological:     Mental Status: She is alert.     Cranial Nerves: Cranial nerve deficit present.     Motor: Weakness present.     Comments: Has baseline chorea. Right-sided facial weakness. Poor coordination on nose to finger test. Right sided sinks to be more affected.  Psychiatric:        Mood and Affect: Mood normal.    Data Reviewed:  There are no new results to review at this time.  Assessment and Plan: Principal Problem:   Acute CVA (cerebrovascular accident) ([place]) Admit to telemetry/inpatient. Frequent neurochecks. Consult PT and OT. Check fasting lipids. Check hemoglobin A1c. Check  carotid Doppler. Check echocardiogram. Check MRI of brain. Risk factors modifications. Stroke team input appreciated.  Active Problems:   HTN (hypertension) Allow permissive hypertension.    Depression Continue fluoxetine 20 mg p.o. daily.    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 Will be evaluated by PT.    Hyperlipidemia associated with type 2 diabetes mellitus (HCC) Continue atorvastatin 20 mg p.o. daily.    Uncontrolled type 2 diabetes mellitus with hyperglycemia (HCC) Currently NPO. CBG monitoring every 6 hours. Resume insulin once cleared for oral intake.    Abnormal LFTs Recheck LFTs in AM.     Advance Care Planning:   Code Status: Full Code   Consults: Neuro hospitalist team.  Family Communication: Her sister was at bedside.  Severity of Illness: The appropriate patient status for this patient is INPATIENT. Inpatient status is judged to be reasonable and necessary in order to provide the required intensity of service to ensure the patient's safety. The patient's presenting symptoms, physical exam findings, and initial radiographic and laboratory data in the context of their chronic comorbidities is felt to place them at high risk for further clinical deterioration. Furthermore, it is not  anticipated that the patient will be medically stable for discharge from the hospital within 2 midnights of admission.   * I certify that at the point of admission it is my clinical judgment that the patient will require inpatient hospital care spanning beyond 2 midnights from the point of admission due to high intensity of service, high risk for further deterioration and high frequency of surveillance required.*  Author: Reubin Milan, MD 11/15/2021 7:06 PM  For on call review [email or web address].   This document was prepared using Dragon voice recognition software and may contain some unintended transcription errors.

## 2021-11-15 NOTE — Plan of Care (Signed)
MRI brain personally reviewed, stroke demonstrated, full radiology read pending  # Left corona radiata / internal capsule stroke, with slight extension to basal ganglia likely secondary to small vessel disease - Stroke labs HgbA1c, fasting lipid panel(ordered) - Frequent neuro checks(ordered) - Echocardiogram(ordered) - Prophylactic therapy-Antiplatelet med: Aspirin - dose '325mg'$  PO or '300mg'$  PR, followed by 81 mg daily(ordered) - Plavix 300 mg load with 75 mg daily after cleared for swallow (ordered) - Statin as needed for LDL goal < 70 (not ordered yet) - Risk factor modification - Telemetry monitoring - Blood pressure goal   - Permissive hypertension to 220/120 for 24-48 hours  - PT consult, OT consult, Speech consult(ordered) - Stroke team to follow in consultation (admit to medicine at [facility])  No charge same day note

## 2021-11-15 NOTE — ED Provider Notes (Signed)
  Physical Exam  BP (!) 186/103   Pulse 83   Temp 98.9 F (37.2 C) (Oral)   Resp 20   Ht '5\' 7"'$  (1.702 m)   Wt 72.6 kg   SpO2 100%   BMI 25.06 kg/m   Physical Exam  Procedures  Procedures  ED Course / MDM    Medical Decision Making Care assumed at 3 pm.  Patient has trouble speaking since yesterday.  Dr. Curly Shores from teleneurology already saw the patient.  Signed out pending MRI  4:15 PM MRI showed acute stroke.  Dr. Olevia Bowens to admit  Amount and/or Complexity of Data Reviewed Labs: ordered. Radiology: ordered.  Risk Prescription drug management. Decision regarding hospitalization.          Drenda Freeze, MD 11/15/21 2322

## 2021-11-15 NOTE — Progress Notes (Signed)
Patient back in room from CT department- telemedicine cart taken back to room with patient.

## 2021-11-15 NOTE — ED Provider Notes (Signed)
[facility] DEPT Provider Note  CSN: [id number] Arrival date & time: 11/15/21 1045  Chief Complaint(s) Aphasia  HPI Hayley Bryant is a 51 y.o. female with PMH diabetes, HTN, Huntington's disease who presents emergency department for evaluation of aphasia.  Last known well approximately 9 PM last night and patient awoke with significant difficulty with word formation.  She denies numbness, tingling, weakness.  Additional history obtained from family members who states that she has had some confusion such as difficulty with using a microwave that required assistance last night.  Denies chest pain, shortness of breath, abdominal pain, nausea, vomiting or other systemic symptoms.   Past Medical History Past Medical History:  Diagnosis Date   Anxiety    Depression    Diabetes mellitus    Fibroids    Hypertension    Patient Active Problem List   Diagnosis Date Noted   Suicidal overdose ([place]) 12/06/2012   HTN (hypertension) 12/06/2012   DM (diabetes mellitus) ([place]) 12/06/2012   Home Medication(s) Prior to Admission medications   Medication Sig Start Date End Date Taking? Authorizing Provider  blood glucose meter kit and supplies KIT Dispense based on patient and insurance preference. Use up to four times daily as directed. 09/16/21   Prosperi, Christian H, PA-C  Dulaglutide (TRULICITY) 3 FO/2.7XA SOPN Inject 3 mg as directed once a week. 09/16/21   Prosperi, Christian H, PA-C  empagliflozin (JARDIANCE) 10 MG TABS tablet Take 1 tablet (10 mg total) by mouth daily. 09/16/21   Prosperi, Christian H, PA-C  ibuprofen (ADVIL) 200 MG tablet Take 400 mg by mouth every 6 (six) hours as needed for headache or mild pain.    [provider]  insulin glargine, 1 Unit Dial, (TOUJEO SOLOSTAR) 300 UNIT/ML Solostar Pen Inject 17 Units into the skin at bedtime. 09/16/21   Prosperi, Christian H, PA-C  lisinopril-hydrochlorothiazide (ZESTORETIC) 20-25 MG tablet Take 1  tablet by mouth daily. 09/16/21   Prosperi, Christian H, PA-C  Melatonin 10 MG TABS Take 10 mg by mouth daily.    [provider]  methocarbamol (ROBAXIN) 500 MG tablet Take 1 tablet (500 mg total) by mouth 2 (two) times daily. 09/16/21   Prosperi, Christian H, PA-C  naphazoline-pheniramine (NAPHCON-A) 0.025-0.3 % ophthalmic solution Place 1 drop into both eyes every 4 (four) hours as needed for irritation. Patient not taking: Reported on 12/22/2016 04/13/16   Recardo Evangelist, PA-C                                                                                                                                    Past Surgical History Past Surgical History:  Procedure Laterality Date   CHOLECYSTECTOMY     LYMPH NODE DISSECTION     Family History Family History  Problem Relation Age of Onset   Hypertension Mother    Heart attack Mother    Huntington's disease Mother    Huntington's  disease Maternal Grandmother     Social History Social History   Tobacco Use   Smoking status: Never   Smokeless tobacco: Never  Substance Use Topics   Alcohol use: No   Drug use: No   Allergies Metformin and related  Review of Systems Review of Systems  Neurological:  Positive for speech difficulty.  Psychiatric/Behavioral:  Positive for confusion.     Physical Exam Vital Signs  I have reviewed the triage vital signs BP (!) 196/97   Pulse 87   Temp 98.5 F (36.9 C) (Oral)   Resp 16   Ht 5' 7"  (1.702 m)   Wt 72.6 kg   SpO2 97%   BMI 25.06 kg/m   Physical Exam Vitals and nursing note reviewed.  Constitutional:      General: She is not in acute distress.    Appearance: She is well-developed.  HENT:     Head: Normocephalic and atraumatic.  Eyes:     Conjunctiva/sclera: Conjunctivae normal.  Cardiovascular:     Rate and Rhythm: Normal rate and regular rhythm.     Heart sounds: No murmur heard. Pulmonary:     Effort: Pulmonary effort is normal. No respiratory distress.      Breath sounds: Normal breath sounds.  Abdominal:     Palpations: Abdomen is soft.     Tenderness: There is no abdominal tenderness.  Musculoskeletal:        General: No swelling.     Cervical back: Neck supple.  Skin:    General: Skin is warm and dry.     Capillary Refill: Capillary refill takes less than 2 seconds.  Neurological:     Mental Status: She is alert.     Comments: Aphasia, no focal motor or sensory deficits  Psychiatric:        Mood and Affect: Mood normal.     ED Results and Treatments Labs (all labs ordered are listed, but only abnormal results are displayed) Labs Reviewed  CBC - Abnormal; Notable for the following components:      Result Value   Hemoglobin 11.3 (*)    HCT 35.7 (*)    Platelets 415 (*)    All other components within normal limits  COMPREHENSIVE METABOLIC PANEL - Abnormal; Notable for the following components:   Glucose, Bld 166 (*)    Total Protein 8.5 (*)    AST 52 (*)    Total Bilirubin 1.6 (*)    All other components within normal limits  URINALYSIS, ROUTINE W REFLEX MICROSCOPIC - Abnormal; Notable for the following components:   Ketones, ur 5 (*)    All other components within normal limits  CBG MONITORING, ED - Abnormal; Notable for the following components:   Glucose-Capillary 171 (*)    All other components within normal limits  RESP PANEL BY RT-PCR (FLU A&B, COVID) ARPGX2  ETHANOL  PROTIME-INR  APTT  DIFFERENTIAL  RAPID URINE DRUG SCREEN, HOSP PERFORMED  HCG, QUANTITATIVE, PREGNANCY  Radiology CT ANGIO HEAD NECK W WO CM (CODE STROKE)  Result Date: 11/15/2021 CLINICAL DATA:  Right facial droop EXAM: CT ANGIOGRAPHY HEAD AND NECK TECHNIQUE: Multidetector CT imaging of the head and neck was performed using the standard protocol during bolus administration of intravenous contrast. Multiplanar CT image  reconstructions and MIPs were obtained to evaluate the vascular anatomy. Carotid stenosis measurements (when applicable) are obtained utilizing NASCET criteria, using the distal internal carotid diameter as the denominator. RADIATION DOSE REDUCTION: This exam was performed according to the departmental dose-optimization program which includes automated exposure control, adjustment of the mA and/or kV according to patient size and/or use of iterative reconstruction technique. CONTRAST:  78m OMNIPAQUE IOHEXOL 350 MG/ML SOLN COMPARISON:  None Available. FINDINGS: CTA NECK Aortic arch: Minimal calcified plaque. Great vessel origins are patent. Right carotid system: Patent. Mild mixed plaque along the proximal ICA with less than 50% stenosis. Left carotid system: Patent. Mild mixed plaque along the proximal ICA with less than 50% stenosis. Vertebral arteries: Patent and codominant.  No stenosis. Skeleton: No acute or significant osseous abnormality. Other neck: Unremarkable. Upper chest: 3 mm nodule of the left upper lobe. Review of the MIP images confirms the above findings CTA HEAD Anterior circulation: Intracranial internal carotid arteries are patent. Anterior and middle cerebral arteries are patent. Posterior circulation: Intracranial vertebral arteries are patent. Basilar artery is patent. Bilateral posterior communicating arteries are present. Posterior cerebral arteries are patent. Venous sinuses: Patent as allowed by contrast bolus timing. Review of the MIP images confirms the above findings IMPRESSION: No large vessel occlusion, hemodynamically significant stenosis, or evidence of dissection. Electronically Signed   By: PMacy MisM.D.   On: 11/15/2021 11:39   CT HEAD CODE STROKE WO CONTRAST  Result Date: 11/15/2021 CLINICAL DATA:  Code stroke. EXAM: CT HEAD WITHOUT CONTRAST TECHNIQUE: Contiguous axial images were obtained from the base of the skull through the vertex without intravenous contrast.  RADIATION DOSE REDUCTION: This exam was performed according to the departmental dose-optimization program which includes automated exposure control, adjustment of the mA and/or kV according to patient size and/or use of iterative reconstruction technique. COMPARISON:  No prior head CT, correlation is made with MRI head 10/10/2016 FINDINGS: Brain: No evidence of acute infarction, hemorrhage, cerebral edema, mass, mass effect, or midline shift. No hydrocephalus or extra-axial collection. Mildly advanced cerebral atrophy for age. Vascular: No hyperdense vessel. Skull: Negative for fracture or focal lesion. Sinuses/Orbits: No acute finding. Other: The mastoid air cells are well aerated. ASPECTS ([facility]) - Ganglionic level infarction (caudate, lentiform nuclei, internal capsule, insula, M1-M3 cortex): 7 - Supraganglionic infarction (M4-M6 cortex): 3 Total score (0-10 with 10 being normal): 10 IMPRESSION: 1. No acute intracranial process. 2. ASPECTS is 10 Code stroke imaging results were communicated on 11/15/2021 at 11:12 am to [facility], who verbally acknowledged these results. Electronically Signed   By: AMerilyn BabaM.D.   On: 11/15/2021 11:14    Pertinent labs & imaging results that were available during my care of the patient were reviewed by me and considered in my medical decision making (see MDM for details).  Medications Ordered in ED Medications  sodium chloride (PF) 0.9 % injection (has no administration in time range)  iohexol (OMNIPAQUE) 350 MG/ML injection 65 mL (65 mLs Intravenous Contrast Given 11/15/21 1125)  LORazepam (ATIVAN) injection 1 mg (1 mg Intravenous Given 11/15/21 1412)  Procedures .Critical Care  Performed by: Teressa Lower, MD Authorized by: Teressa Lower, MD   Critical care provider statement:     Critical care time (minutes):  30   Critical care was necessary to treat or prevent imminent or life-threatening deterioration of the following conditions: stroke activation with consideration of thrombectomy.   Critical care was time spent personally by me on the following activities:  Development of treatment plan with patient or surrogate, discussions with consultants, evaluation of patient's response to treatment, examination of patient, ordering and review of laboratory studies, ordering and review of radiographic studies, ordering and performing treatments and interventions, pulse oximetry, re-evaluation of patient's condition and review of old charts   (including critical care time)  Medical Decision Making / ED Course   This patient presents to the ED for concern of aphasia, this involves an extensive number of treatment options, and is a complaint that carries with it a high risk of complications and morbidity.  The differential diagnosis includes CVA, hypertensive emergency, PRES, progression of Huntington's disease  MDM: Patient seen emergency room for evaluation of aphasia.  Physical exam with significant aphasia but no focal motor or sensory deficits.  Stroke alert called given patient being Lucianne Lei positive and within the thrombectomy window.  Initial stroke imaging with CT head and CT angiogram negative for acute CVA.  Stroke neurologist recommending MRI and if positive, admission to stroke service, if negative, aggressive blood pressure control and medical admission for hypertensive emergency.  We will not aggressively control blood pressure and allow for permissive hypertension until MRI is performed.  Initial laboratory evaluation unremarkable outside of a hemoglobin 11.3.  At time of signout, patient pending MRI.  Please see provider signout for continuation of work-up.   Additional history obtained: -Additional history obtained from multiple family members -External records from  outside source obtained and reviewed including: Chart review including previous notes, labs, imaging, consultation notes   Lab Tests: -I ordered, reviewed, and interpreted labs.   The pertinent results include:   Labs Reviewed  CBC - Abnormal; Notable for the following components:      Result Value   Hemoglobin 11.3 (*)    HCT 35.7 (*)    Platelets 415 (*)    All other components within normal limits  COMPREHENSIVE METABOLIC PANEL - Abnormal; Notable for the following components:   Glucose, Bld 166 (*)    Total Protein 8.5 (*)    AST 52 (*)    Total Bilirubin 1.6 (*)    All other components within normal limits  URINALYSIS, ROUTINE W REFLEX MICROSCOPIC - Abnormal; Notable for the following components:   Ketones, ur 5 (*)    All other components within normal limits  CBG MONITORING, ED - Abnormal; Notable for the following components:   Glucose-Capillary 171 (*)    All other components within normal limits  RESP PANEL BY RT-PCR (FLU A&B, COVID) ARPGX2  ETHANOL  PROTIME-INR  APTT  DIFFERENTIAL  RAPID URINE DRUG SCREEN, HOSP PERFORMED  HCG, QUANTITATIVE, PREGNANCY      EKG   EKG Interpretation  Date/Time:  Friday November 15 2021 11:34:38 EDT Ventricular Rate:  97 PR Interval:  165 QRS Duration: 89 QT Interval:  347 QTC Calculation: 441 R Axis:   -4 Text Interpretation: Sinus rhythm Confirmed by Judson ([id number]) on 11/15/2021 2:48:22 PM         Imaging Studies ordered: I ordered imaging studies including CT head, CT angiogram, MRI brain I independently visualized and interpreted  imaging. I agree with the radiologist interpretation   Medicines ordered and prescription drug management: Meds ordered this encounter  Medications   sodium chloride (PF) 0.9 % injection    Tamala Julian, Megan L: cabinet override   iohexol (OMNIPAQUE) 350 MG/ML injection 65 mL   LORazepam (ATIVAN) injection 1 mg    -I have reviewed the patients home medicines and have made adjustments  as needed  Critical interventions Stroke activation and consideration of thrombectomy, imaging studies  Consultations Obtained: I requested consultation with the stroke neurologist,  and discussed lab and imaging findings as well as pertinent plan - they recommend: Stroke admission if MRI positive, medical admission with blood pressure control if MRI negative   Cardiac Monitoring: The patient was maintained on a cardiac monitor.  I personally viewed and interpreted the cardiac monitored which showed an underlying rhythm of: NSR, sinus tachycardia  Social Determinants of Health:  Factors impacting patients care include: none   Reevaluation: After the interventions noted above, I reevaluated the patient and found that they have :stayed the same  Co morbidities that complicate the patient evaluation  Past Medical History:  Diagnosis Date   Anxiety    Depression    Diabetes mellitus    Fibroids    Hypertension       Dispostion: I considered admission for this patient, and disposition currently pending MRI.  Anticipate admission     Final Clinical Impression(s) / ED Diagnoses Final diagnoses:  None     @PCDICTATION @    Teressa Lower, MD 11/15/21 1449

## 2021-11-15 NOTE — ED Triage Notes (Signed)
Pt reports waking up this morning with slurred speech and inability to communicate normally. Pt reports when she went to bed her speech was normal. Pt also has slight drooping of her mouth. Hx of Huntingtons.

## 2021-11-15 NOTE — Progress Notes (Signed)
Stroke alert cart activation at 1056

## 2021-11-15 NOTE — Progress Notes (Signed)
Neurologist, Dr Lesleigh Noe, joins the telemedicine cart at 1106 (cart in CT department with patient).

## 2021-11-15 NOTE — ED Notes (Addendum)
ED TO INPATIENT HANDOFF REPORT  Name/Age/Gender Hayley Bryant 51 y.o. female  Code Status    Code Status Orders  (From admission, onward)           Start     Ordered   11/15/21 1608  Full code  Continuous        11/15/21 1609           Code Status History     Date Active Date Inactive Code Status Order ID Comments User Context   12/06/2012 0138 12/07/2012 1653 Full Code [id number]  Orvan Falconer, MD ED       Home/SNF/Other Home  Chief Complaint Acute CVA (cerebrovascular accident) [facility]) [I63.9]  Level of Care/Admitting Diagnosis ED Disposition     ED Disposition  Admit   Condition  --   [facility]: [place] [[id number]]  Level of Care: Telemetry Medical [104]  May admit patient to [facility] or [facility] if equivalent level of care is available:: No  Covid Evaluation: Asymptomatic - no recent exposure (last 10 days) testing not required  Diagnosis: Acute CVA (cerebrovascular accident) [facility]) [[id number]]  Admitting Physician: Reubin Milan [[id number]]  Attending Physician: Reubin Milan [[id number]]  Estimated length of stay: past midnight tomorrow  Certification:: I certify this patient will need inpatient services for at least 2 midnights          Medical History Past Medical History:  Diagnosis Date   Anxiety    Depression    Diabetes mellitus    Fibroids    Hypertension     Allergies Allergies  Allergen Reactions   Metformin And Related Other (See Comments)    Pt. States it makes her heart flutter.     IV Location/Drains/Wounds Patient Lines/Drains/Airways Status     Active Line/Drains/Airways     Name Placement date Placement time Site Days   Peripheral IV 11/15/21 20 G 1" Right Antecubital 11/15/21  1057  Antecubital  less than 1            Labs/Imaging Results for orders placed or performed during the hospital encounter of 11/15/21 (from the past 48 hour(s))  CBG monitoring, ED     Status:  Abnormal   Collection Time: 11/15/21 10:53 AM  Result Value Ref Range   Glucose-Capillary 171 (H) 70 - 99 mg/dL    Comment: Glucose reference range applies only to samples taken after fasting for at least 8 hours.  Ethanol     Status: None   Collection Time: 11/15/21 10:57 AM  Result Value Ref Range   Alcohol, Ethyl (B) <10 <10 mg/dL    Comment: (NOTE) Lowest detectable limit for serum alcohol is 10 mg/dL.  For medical purposes only. Performed at [facility], [place] [address] [place], [place] [address]   Protime-INR     Status: None   Collection Time: 11/15/21 10:57 AM  Result Value Ref Range   Prothrombin Time 13.2 11.4 - 15.2 seconds   INR 1.0 0.8 - 1.2    Comment: (NOTE) INR goal varies based on device and disease states. Performed at [facility], [place] [address] [place], [place] [address]   APTT     Status: None   Collection Time: 11/15/21 10:57 AM  Result Value Ref Range   aPTT 26 24 - 36 seconds    Comment: Performed at [facility], [place] [address], [place], [place] [address]  CBC     Status:  Abnormal   Collection Time: 11/15/21 10:57 AM  Result Value Ref Range   WBC 8.9 4.0 - 10.5 K/uL   RBC 4.13 3.87 - 5.11 MIL/uL   Hemoglobin 11.3 (L) 12.0 - 15.0 g/dL   HCT 35.7 (L) 36.0 - 46.0 %   MCV 86.4 80.0 - 100.0 fL   MCH 27.4 26.0 - 34.0 pg   MCHC 31.7 30.0 - 36.0 g/dL   RDW 14.7 11.5 - 15.5 %   Platelets 415 (H) 150 - 400 K/uL   nRBC 0.0 0.0 - 0.2 %    Comment: Performed at [facility], [place] [address], [place], [place] [address]  Differential     Status: None   Collection Time: 11/15/21 10:57 AM  Result Value Ref Range   Neutrophils Relative % 74 %   Neutro Abs 6.6 1.7 - 7.7 K/uL   Lymphocytes Relative 20 %   Lymphs Abs 1.8 0.7 - 4.0 K/uL   Monocytes Relative 5 %   Monocytes Absolute 0.5 0.1 - 1.0 K/uL   Eosinophils Relative 0 %   Eosinophils Absolute 0.0 0.0 - 0.5 K/uL   Basophils  Relative 1 %   Basophils Absolute 0.0 0.0 - 0.1 K/uL   Immature Granulocytes 0 %   Abs Immature Granulocytes 0.02 0.00 - 0.07 K/uL    Comment: Performed at [facility], [place] [address], [place], [place] [address]  Comprehensive metabolic panel     Status: Abnormal   Collection Time: 11/15/21 10:57 AM  Result Value Ref Range   Sodium 136 135 - 145 mmol/L   Potassium 4.6 3.5 - 5.1 mmol/L   Chloride 105 98 - 111 mmol/L   CO2 23 22 - 32 mmol/L   Glucose, Bld 166 (H) 70 - 99 mg/dL    Comment: Glucose reference range applies only to samples taken after fasting for at least 8 hours.   BUN 10 6 - 20 mg/dL   Creatinine, Ser 0.58 0.44 - 1.00 mg/dL   Calcium 9.2 8.9 - 10.3 mg/dL   Total Protein 8.5 (H) 6.5 - 8.1 g/dL   Albumin 4.6 3.5 - 5.0 g/dL   AST 52 (H) 15 - 41 U/L   ALT 24 0 - 44 U/L   Alkaline Phosphatase 61 38 - 126 U/L   Total Bilirubin 1.6 (H) 0.3 - 1.2 mg/dL   GFR, Estimated >60 >60 mL/min    Comment: (NOTE) Calculated using the CKD-EPI Creatinine Equation (2021)    Anion gap 8 5 - 15    Comment: Performed at [facility], [place] [address] [place], [place] [address]  hCG, quantitative, pregnancy     Status: None   Collection Time: 11/15/21 10:57 AM  Result Value Ref Range   hCG, Beta Chain, Quant, S <1 <5 mIU/mL    Comment:          GEST. AGE      CONC.  (mIU/mL)   <=1 WEEK        5 - 50     2 WEEKS       50 - 500     3 WEEKS       100 - 10,000     4 WEEKS     1,000 - 30,000     5 WEEKS     3,500 - 115,000   6-8 WEEKS     12,000 - 270,000    12 WEEKS     15,000 - 220,000  FEMALE AND NON-PREGNANT FEMALE:     LESS THAN 5 mIU/mL Performed at [facility], [place] [address] [place], [place] [address]   Urinalysis, Routine w reflex microscopic Anterior Nasal Swab     Status: Abnormal   Collection Time: 11/15/21 12:06 PM  Result Value Ref Range   Color, Urine YELLOW YELLOW   APPearance CLEAR CLEAR   Specific  Gravity, Urine 1.019 1.005 - 1.030   pH 7.0 5.0 - 8.0   Glucose, UA NEGATIVE NEGATIVE mg/dL   Hgb urine dipstick NEGATIVE NEGATIVE   Bilirubin Urine NEGATIVE NEGATIVE   Ketones, ur 5 (A) NEGATIVE mg/dL   Protein, ur NEGATIVE NEGATIVE mg/dL   Nitrite NEGATIVE NEGATIVE   Leukocytes,Ua NEGATIVE NEGATIVE    Comment: Performed at [place] [address] [place], [place] [address]  Resp Panel by RT-PCR (Flu A&B, Covid) Anterior Nasal Swab     Status: None   Collection Time: 11/15/21 12:16 PM   Specimen: Anterior Nasal Swab  Result Value Ref Range   SARS Coronavirus 2 by RT PCR NEGATIVE NEGATIVE    Comment: (NOTE) SARS-CoV-2 target nucleic acids are NOT DETECTED.  The SARS-CoV-2 RNA is generally detectable in upper respiratory specimens during the acute phase of infection. The lowest concentration of SARS-CoV-2 viral copies this assay can detect is 138 copies/mL. A negative result does not preclude SARS-Cov-2 infection and should not be used as the sole basis for treatment or other patient management decisions. A negative result may occur with  improper specimen collection/handling, submission of specimen other than nasopharyngeal swab, presence of viral mutation(s) within the areas targeted by this assay, and inadequate number of viral copies(<138 copies/mL). A negative result must be combined with clinical observations, patient history, and epidemiological information. The expected result is Negative.  Fact Sheet for Patients:  EntrepreneurPulse.com.au  Fact Sheet for Healthcare Providers:  IncredibleEmployment.be  This test is no t yet approved or cleared by the [place] FDA and  has been authorized for detection and/or diagnosis of SARS-CoV-2 by FDA under an Emergency Use Authorization (EUA). This EUA will remain  in effect (meaning this test can be used) for the duration of the COVID-19 declaration under Section  564(b)(1) of the Act, 21 U.S.C.section 360bbb-3(b)(1), unless the authorization is terminated  or revoked sooner.       Influenza A by PCR NEGATIVE NEGATIVE   Influenza B by PCR NEGATIVE NEGATIVE    Comment: (NOTE) The Xpert Xpress SARS-CoV-2/FLU/RSV plus assay is intended as an aid in the diagnosis of influenza from Nasopharyngeal swab specimens and should not be used as a sole basis for treatment. Nasal washings and aspirates are unacceptable for Xpert Xpress SARS-CoV-2/FLU/RSV testing.  Fact Sheet for Patients: EntrepreneurPulse.com.au  Fact Sheet for Healthcare Providers: IncredibleEmployment.be  This test is not yet approved or cleared by the [place] FDA and has been authorized for detection and/or diagnosis of SARS-CoV-2 by FDA under an Emergency Use Authorization (EUA). This EUA will remain in effect (meaning this test can be used) for the duration of the COVID-19 declaration under Section 564(b)(1) of the Act, 21 U.S.C. section 360bbb-3(b)(1), unless the authorization is terminated or revoked.  Performed at [facility], [place] [address], [place], [place] [address]   Urine rapid drug screen (hosp performed)     Status: None   Collection Time: 11/15/21 12:16 PM  Result Value Ref Range   Opiates NONE DETECTED NONE DETECTED   Cocaine NONE DETECTED NONE DETECTED  Benzodiazepines NONE DETECTED NONE DETECTED   Amphetamines NONE DETECTED NONE DETECTED   Tetrahydrocannabinol NONE DETECTED NONE DETECTED   Barbiturates NONE DETECTED NONE DETECTED    Comment: (NOTE) DRUG SCREEN FOR MEDICAL PURPOSES ONLY.  IF CONFIRMATION IS NEEDED FOR ANY PURPOSE, NOTIFY LAB WITHIN 5 DAYS.  LOWEST DETECTABLE LIMITS FOR URINE DRUG SCREEN Drug Class                     Cutoff (ng/mL) Amphetamine and metabolites    1000 Barbiturate and metabolites    200 Benzodiazepine                 500 Tricyclics and metabolites      300 Opiates and metabolites        300 Cocaine and metabolites        300 THC                            50 Performed at [facility], [place] [address], [place], [place] [address]    MR BRAIN W WO CONTRAST  Result Date: 11/15/2021 CLINICAL DATA:  Neuro deficit, acute, stroke suspected Delirium EXAM: MRI HEAD WITHOUT AND WITH CONTRAST TECHNIQUE: Multiplanar, multiecho pulse sequences of the brain and surrounding structures were obtained without and with intravenous contrast. CONTRAST:  40m GADAVIST GADOBUTROL 1 MMOL/ML IV SOLN COMPARISON:  2018 FINDINGS: Brain: Small area of restricted diffusion extending from the left lentiform nucleus superiorly to the corona radiata and caudate body. No hemorrhage. Patchy T2 hyperintensity in the supratentorial white matter is nonspecific but may reflect mild chronic microvascular ischemic changes. Prominence of the ventricles and sulci reflects parenchymal volume loss. These findings have progressed since 2018. No intracranial mass or mass effect. There is no hydrocephalus or extra-axial fluid collection. No abnormal enhancement. Vascular: Major vessel flow voids at the skull base are preserved. Skull and upper cervical spine: Normal marrow signal is preserved. Sinuses/Orbits: Paranasal sinuses are aerated. Orbits are unremarkable. Other: Sella is unremarkable.  Mastoid air cells are clear. IMPRESSION: Acute small vessel infarct involving the left basal ganglia and adjacent white matter. Mild chronic microvascular ischemic changes. Electronically Signed   By: PMacy MisM.D.   On: 11/15/2021 15:28   CT ANGIO HEAD NECK W WO CM (CODE STROKE)  Result Date: 11/15/2021 CLINICAL DATA:  Right facial droop EXAM: CT ANGIOGRAPHY HEAD AND NECK TECHNIQUE: Multidetector CT imaging of the head and neck was performed using the standard protocol during bolus administration of intravenous contrast. Multiplanar CT image reconstructions and MIPs were obtained to  evaluate the vascular anatomy. Carotid stenosis measurements (when applicable) are obtained utilizing NASCET criteria, using the distal internal carotid diameter as the denominator. RADIATION DOSE REDUCTION: This exam was performed according to the departmental dose-optimization program which includes automated exposure control, adjustment of the mA and/or kV according to patient size and/or use of iterative reconstruction technique. CONTRAST:  661mOMNIPAQUE IOHEXOL 350 MG/ML SOLN COMPARISON:  None Available. FINDINGS: CTA NECK Aortic arch: Minimal calcified plaque. Great vessel origins are patent. Right carotid system: Patent. Mild mixed plaque along the proximal ICA with less than 50% stenosis. Left carotid system: Patent. Mild mixed plaque along the proximal ICA with less than 50% stenosis. Vertebral arteries: Patent and codominant.  No stenosis. Skeleton: No acute or significant osseous abnormality. Other neck: Unremarkable. Upper chest: 3 mm nodule of the left upper lobe. Review of the MIP images confirms the above findings  CTA HEAD Anterior circulation: Intracranial internal carotid arteries are patent. Anterior and middle cerebral arteries are patent. Posterior circulation: Intracranial vertebral arteries are patent. Basilar artery is patent. Bilateral posterior communicating arteries are present. Posterior cerebral arteries are patent. Venous sinuses: Patent as allowed by contrast bolus timing. Review of the MIP images confirms the above findings IMPRESSION: No large vessel occlusion, hemodynamically significant stenosis, or evidence of dissection. Electronically Signed   By: Macy Mis M.D.   On: 11/15/2021 11:39   CT HEAD CODE STROKE WO CONTRAST  Result Date: 11/15/2021 CLINICAL DATA:  Code stroke. EXAM: CT HEAD WITHOUT CONTRAST TECHNIQUE: Contiguous axial images were obtained from the base of the skull through the vertex without intravenous contrast. RADIATION DOSE REDUCTION: This exam was  performed according to the departmental dose-optimization program which includes automated exposure control, adjustment of the mA and/or kV according to patient size and/or use of iterative reconstruction technique. COMPARISON:  No prior head CT, correlation is made with MRI head 10/10/2016 FINDINGS: Brain: No evidence of acute infarction, hemorrhage, cerebral edema, mass, mass effect, or midline shift. No hydrocephalus or extra-axial collection. Mildly advanced cerebral atrophy for age. Vascular: No hyperdense vessel. Skull: Negative for fracture or focal lesion. Sinuses/Orbits: No acute finding. Other: The mastoid air cells are well aerated. ASPECTS [facility] Stroke Program Early CT Score) - Ganglionic level infarction (caudate, lentiform nuclei, internal capsule, insula, M1-M3 cortex): 7 - Supraganglionic infarction (M4-M6 cortex): 3 Total score (0-10 with 10 being normal): 10 IMPRESSION: 1. No acute intracranial process. 2. ASPECTS is 10 Code stroke imaging results were communicated on 11/15/2021 at 11:12 am to provider [facility] via telephone, who verbally acknowledged these results. Electronically Signed   By: Merilyn Baba M.D.   On: 11/15/2021 11:14    Pending Labs Unresulted Labs (From admission, onward)     Start     Ordered   11/16/21 0500  Lipid panel  (Labs)  Tomorrow morning,   R       Comments: Fasting    11/15/21 1511   11/16/21 0500  Lipid panel  (Labs)  Tomorrow morning,   R       Comments: Fasting    11/15/21 1609   11/16/21 0500  HIV Antibody (routine testing w rflx)  (HIV Antibody (Routine testing w reflex) panel)  Tomorrow morning,   R        11/15/21 1609   11/16/21 0500  Hemoglobin A1c  (Labs)  Tomorrow morning,   R       Comments: To assess prior glycemic control    11/15/21 1609   11/16/21 0500  CBC  Tomorrow morning,   R        11/15/21 1609   11/16/21 0500  Comprehensive metabolic panel  Tomorrow morning,   R        11/15/21 1609   11/15/21 1511  Hemoglobin A1c  (Labs)   Add-on,   AD       Comments: To assess prior glycemic control    11/15/21 1511            Vitals/Pain Today's Vitals   11/15/21 1345 11/15/21 1400 11/15/21 1530 11/15/21 1615  BP: (!) 193/101 (!) 196/97 (!) 186/103 (!) 153/90  Pulse: 79 87 83 71  Resp: (!) '21 16 20 16  '$ Temp:   98.9 F (37.2 C)   TempSrc:   Oral   SpO2: 97% 97% 100% 100%  Weight:      Height:      PainSc:  Isolation Precautions No active isolations  Medications Medications  sodium chloride (PF) 0.9 % injection (has no administration in time range)   stroke: early stages of recovery book (has no administration in time range)  aspirin suppository 300 mg ( Rectal See Alternative 11/15/21 1522)    Or  aspirin tablet 325 mg (325 mg Oral Given 11/15/21 1522)  clopidogrel (PLAVIX) tablet 300 mg (300 mg Oral Given 11/15/21 1522)    Followed by  clopidogrel (PLAVIX) tablet 75 mg (has no administration in time range)  acetaminophen (TYLENOL) tablet 650 mg (has no administration in time range)    Or  acetaminophen (TYLENOL) suppository 650 mg (has no administration in time range)  ondansetron (ZOFRAN) tablet 4 mg (has no administration in time range)    Or  ondansetron (ZOFRAN) injection 4 mg (has no administration in time range)  iohexol (OMNIPAQUE) 350 MG/ML injection 65 mL (65 mLs Intravenous Contrast Given 11/15/21 1125)  LORazepam (ATIVAN) injection 1 mg (1 mg Intravenous Given 11/15/21 1412)  gadobutrol (GADAVIST) 1 MMOL/ML injection 7 mL (7 mLs Intravenous Contrast Given 11/15/21 1518)    Mobility t

## 2021-11-16 ENCOUNTER — Inpatient Hospital Stay (HOSPITAL_COMMUNITY): Payer: Managed Care, Other (non HMO)

## 2021-11-16 DIAGNOSIS — I639 Cerebral infarction, unspecified: Secondary | ICD-10-CM

## 2021-11-16 DIAGNOSIS — R7989 Other specified abnormal findings of blood chemistry: Secondary | ICD-10-CM | POA: Diagnosis not present

## 2021-11-16 DIAGNOSIS — I10 Essential (primary) hypertension: Secondary | ICD-10-CM | POA: Diagnosis not present

## 2021-11-16 LAB — COMPREHENSIVE METABOLIC PANEL
ALT: 13 U/L (ref 0–44)
AST: 18 U/L (ref 15–41)
Albumin: 3.4 g/dL — ABNORMAL LOW (ref 3.5–5.0)
Alkaline Phosphatase: 50 U/L (ref 38–126)
Anion gap: 13 (ref 5–15)
BUN: 8 mg/dL (ref 6–20)
CO2: 20 mmol/L — ABNORMAL LOW (ref 22–32)
Calcium: 8.6 mg/dL — ABNORMAL LOW (ref 8.9–10.3)
Chloride: 105 mmol/L (ref 98–111)
Creatinine, Ser: 0.67 mg/dL (ref 0.44–1.00)
GFR, Estimated: >60 mL/min (ref >60)
Glucose, Bld: 96 mg/dL (ref 70–99)
Potassium: 3.1 mmol/L — ABNORMAL LOW (ref 3.5–5.1)
Sodium: 138 mmol/L (ref 135–145)
Total Bilirubin: 1 mg/dL (ref 0.3–1.2)
Total Protein: 6.4 g/dL — ABNORMAL LOW (ref 6.5–8.1)

## 2021-11-16 LAB — HIV ANTIBODY (ROUTINE TESTING W REFLEX): HIV Screen 4th Generation wRfx: NONREACTIVE

## 2021-11-16 LAB — GLUCOSE, CAPILLARY
Glucose-Capillary: 107 mg/dL — ABNORMAL HIGH (ref 70–99)
Glucose-Capillary: 189 mg/dL — ABNORMAL HIGH (ref 70–99)
Glucose-Capillary: 210 mg/dL — ABNORMAL HIGH (ref 70–99)
Glucose-Capillary: 251 mg/dL — ABNORMAL HIGH (ref 70–99)
Glucose-Capillary: 93 mg/dL (ref 70–99)

## 2021-11-16 LAB — LIPID PANEL
Cholesterol: 129 mg/dL (ref 0–200)
HDL: 41 mg/dL (ref >40)
LDL Cholesterol: 78 mg/dL (ref 0–99)
Total CHOL/HDL Ratio: 3.1 RATIO
Triglycerides: 51 mg/dL (ref <150)
VLDL: 10 mg/dL (ref 0–40)

## 2021-11-16 LAB — CBC
HCT: 31 % — ABNORMAL LOW (ref 36.0–46.0)
Hemoglobin: 10 g/dL — ABNORMAL LOW (ref 12.0–15.0)
MCH: 27.6 pg (ref 26.0–34.0)
MCHC: 32.3 g/dL (ref 30.0–36.0)
MCV: 85.6 fL (ref 80.0–100.0)
Platelets: 329 10*3/uL (ref 150–400)
RBC: 3.62 MIL/uL — ABNORMAL LOW (ref 3.87–5.11)
RDW: 14.8 % (ref 11.5–15.5)
WBC: 7.6 10*3/uL (ref 4.0–10.5)
nRBC: 0 % (ref 0.0–0.2)

## 2021-11-16 LAB — ANTITHROMBIN III: AntiThromb III Func: 101 % (ref 75–120)

## 2021-11-16 LAB — HEMOGLOBIN A1C
Hgb A1c MFr Bld: 7.7 % — ABNORMAL HIGH (ref 4.8–5.6)
Mean Plasma Glucose: 174.29 mg/dL

## 2021-11-16 MED ORDER — ATORVASTATIN CALCIUM 40 MG PO TABS
40.0000 mg | ORAL_TABLET | Freq: Every day | ORAL | Status: DC
  Administered 2021-11-17 – 2021-11-22 (×6): 40 mg via ORAL
  Filled 2021-11-16 (×6): qty 1

## 2021-11-16 MED ORDER — INSULIN ASPART 100 UNIT/ML IJ SOLN
0.0000 [IU] | Freq: Every day | INTRAMUSCULAR | Status: DC
  Administered 2021-11-16 – 2021-11-21 (×4): 2 [IU] via SUBCUTANEOUS

## 2021-11-16 MED ORDER — ASPIRIN 81 MG PO TBEC
81.0000 mg | DELAYED_RELEASE_TABLET | Freq: Every day | ORAL | Status: DC
  Administered 2021-11-17 – 2021-11-22 (×6): 81 mg via ORAL
  Filled 2021-11-16 (×6): qty 1

## 2021-11-16 MED ORDER — POTASSIUM CHLORIDE 10 MEQ/100ML IV SOLN
10.0000 meq | INTRAVENOUS | Status: AC
  Administered 2021-11-16 (×4): 10 meq via INTRAVENOUS
  Filled 2021-11-16 (×4): qty 100

## 2021-11-16 MED ORDER — PANTOPRAZOLE SODIUM 40 MG PO TBEC
40.0000 mg | DELAYED_RELEASE_TABLET | Freq: Every day | ORAL | Status: DC
  Administered 2021-11-16 – 2021-11-22 (×7): 40 mg via ORAL
  Filled 2021-11-16 (×7): qty 1

## 2021-11-16 MED ORDER — LISINOPRIL 20 MG PO TABS
20.0000 mg | ORAL_TABLET | Freq: Every day | ORAL | Status: DC
  Administered 2021-11-17: 20 mg via ORAL
  Filled 2021-11-16: qty 1

## 2021-11-16 MED ORDER — HYDRALAZINE HCL 20 MG/ML IJ SOLN
10.0000 mg | INTRAMUSCULAR | Status: DC | PRN
  Administered 2021-11-19: 10 mg via INTRAVENOUS
  Filled 2021-11-16: qty 1

## 2021-11-16 MED ORDER — ORAL CARE MOUTH RINSE: 15.0000 mL | OROMUCOSAL | Status: DC

## 2021-11-16 MED ORDER — HYDROCHLOROTHIAZIDE 25 MG PO TABS
25.0000 mg | ORAL_TABLET | Freq: Every day | ORAL | Status: DC
  Administered 2021-11-17: 25 mg via ORAL
  Filled 2021-11-16: qty 1

## 2021-11-16 MED ORDER — HEPARIN SODIUM (PORCINE) 5000 UNIT/ML IJ SOLN
5000.0000 [IU] | Freq: Three times a day (TID) | INTRAMUSCULAR | Status: DC
  Administered 2021-11-16 – 2021-11-22 (×17): 5000 [IU] via SUBCUTANEOUS
  Filled 2021-11-16 (×17): qty 1

## 2021-11-16 MED ORDER — INSULIN ASPART 100 UNIT/ML IJ SOLN
0.0000 [IU] | Freq: Three times a day (TID) | INTRAMUSCULAR | Status: DC
  Administered 2021-11-17: 1 [IU] via SUBCUTANEOUS
  Administered 2021-11-17 – 2021-11-18 (×2): 2 [IU] via SUBCUTANEOUS
  Administered 2021-11-18: 1 [IU] via SUBCUTANEOUS
  Administered 2021-11-18: 2 [IU] via SUBCUTANEOUS
  Administered 2021-11-19 (×2): 1 [IU] via SUBCUTANEOUS
  Administered 2021-11-19 – 2021-11-20 (×2): 2 [IU] via SUBCUTANEOUS
  Administered 2021-11-20: 5 [IU] via SUBCUTANEOUS
  Administered 2021-11-20 – 2021-11-21 (×2): 1 [IU] via SUBCUTANEOUS
  Administered 2021-11-21: 2 [IU] via SUBCUTANEOUS
  Administered 2021-11-21: 3 [IU] via SUBCUTANEOUS
  Administered 2021-11-22 (×2): 2 [IU] via SUBCUTANEOUS

## 2021-11-16 MED ORDER — ORAL CARE MOUTH RINSE: 15.0000 mL | OROMUCOSAL | Status: DC | PRN

## 2021-11-16 MED ORDER — LISINOPRIL-HYDROCHLOROTHIAZIDE 20-25 MG PO TABS
1.0000 | ORAL_TABLET | Freq: Every day | ORAL | Status: DC
Start: 2021-11-17 — End: 2021-11-16

## 2021-11-16 NOTE — Evaluation (Signed)
Occupational Therapy Evaluation Patient Details Name: Hayley Bryant MRN: [id number] DOB: 1970-11-26 Today's Date: 11/16/2021   History of Present Illness Pt is a 51 y/o F presenting to ED on 6/16 with slurred speech, CT negative, MRI revealing acute small vessel infarct in L basal ganglia and adjacent white matter. PMH includes Huntington's Disease, HLD, HTN, DM, suicidal overdose, vitamin D deficiency.   Clinical Impression   Pt independent at baseline with ADLs and functional mobility, does not drive and lives with spouse who is available 24/7 at d/c.  Pt currently presenting with decreased strength in BUE R >L weakness, requires min-mod A for ADLs, min guard for bed mobility, and min A for transfers. Pt A &Ox4, however daughter reports pt is not at baseline, reports pt is having difficulty with memory and attention. Pt needing increased time and cuing for ADLs, minimally communicative during session, responding with 1 word answers. Pt reporting no visual deficits, however decreased smoothness and overshooting noted with horizontal tracking, will further assess. Pt presenting with impairments listed below, will follow acutely. Recommend AIR at d/c.     Recommendations for follow up therapy are one component of a multi-disciplinary discharge planning process, led by the attending physician.  Recommendations may be updated based on patient status, additional functional criteria and insurance authorization.   Follow Up Recommendations  Acute inpatient rehab (3hours/day)    Assistance Recommended at Discharge Intermittent Supervision/Assistance  Patient can return home with the following A little help with walking and/or transfers;A lot of help with bathing/dressing/bathroom;Assistance with cooking/housework;Direct supervision/assist for medications management;Direct supervision/assist for financial management;Assist for transportation;Help with stairs or ramp for entrance    Functional Status  Assessment  Patient has had a recent decline in their functional status and demonstrates the ability to make significant improvements in function in a reasonable and predictable amount of time.  Equipment Recommendations  BSC/3in1    Recommendations for Other Services PT consult;Rehab consult     Precautions / Restrictions Precautions Precautions: Fall Restrictions Weight Bearing Restrictions: No      Mobility Bed Mobility Overal bed mobility: Needs Assistance Bed Mobility: Supine to Sit, Sit to Supine     Supine to sit: Min guard Sit to supine: Min guard   General bed mobility comments: increased time, cues to scoot to EOB    Transfers Overall transfer level: Needs assistance Equipment used: 1 person hand held assist Transfers: Sit to/from Stand Sit to Stand: Min assist                  Balance Overall balance assessment: Needs assistance Sitting-balance support: Feet supported, Bilateral upper extremity supported Sitting balance-Leahy Scale: Good     Standing balance support: During functional activity, Single extremity supported Standing balance-Leahy Scale: Fair                             ADL either performed or assessed with clinical judgement   ADL Overall ADL's : Needs assistance/impaired Eating/Feeding: Minimal assistance;Sitting   Grooming: Minimal assistance;Sitting;Wash/dry hands;Wash/dry face   Upper Body Bathing: Moderate assistance;Sitting   Lower Body Bathing: Moderate assistance;Sitting/lateral leans   Upper Body Dressing : Moderate assistance;Sitting   Lower Body Dressing: Moderate assistance;Sitting/lateral leans   Toilet Transfer: Minimal assistance;Ambulation;Regular Glass blower/designer Details (indicate cue type and reason): simulated in room Toileting- Clothing Manipulation and Hygiene: Min guard;Sitting/lateral lean;Sit to/from stand       Functional mobility during ADLs: Minimal assistance;Cueing for  safety;Min  guard       Vision Baseline Vision/History: 1 Wears glasses Patient Visual Report: No change from baseline Vision Assessment?: Yes Eye Alignment: Within Functional Limits Ocular Range of Motion: Impaired-to be further tested in functional context Alignment/Gaze Preference: Within Defined Limits Tracking/Visual Pursuits: Decreased smoothness of horizontal tracking Additional Comments: decreased smoothness when tracking horizontally, overshooting target, able to identify stimuli in peripheral visual fields accurately     Perception     Praxis      Pertinent Vitals/Pain Pain Assessment Pain Assessment: No/denies pain     Hand Dominance Right   Extremity/Trunk Assessment Upper Extremity Assessment Upper Extremity Assessment: Generalized weakness;RUE deficits/detail;LUE deficits/detail RUE Deficits / Details: 3-/5 ROM/strength RUE Coordination: decreased fine motor;decreased gross motor LUE Deficits / Details: 3+/5 ROM/strength LUE Coordination: decreased fine motor;decreased gross motor   Lower Extremity Assessment Lower Extremity Assessment: Defer to PT evaluation   Cervical / Trunk Assessment Cervical / Trunk Assessment: Normal   Communication Communication Communication: Other (comment) (minimally communicative during session, low voice, responds mostly with single word replies)   Cognition Arousal/Alertness: Lethargic Behavior During Therapy: Flat affect Overall Cognitive Status: Impaired/Different from baseline Area of Impairment: Following commands, Memory, Attention, Awareness, Safety/judgement, Problem solving                   Current Attention Level: Selective Memory: Decreased short-term memory Following Commands: Follows one step commands with increased time Safety/Judgement: Decreased awareness of safety, Decreased awareness of deficits Awareness: Intellectual Problem Solving: Slow processing, Requires verbal cues, Requires tactile cues,  Difficulty sequencing General Comments: increase time for basic ADL tasks, daughter reporting pt with decreased attention/concentration compared to baseline     General Comments  BP elevated, confirmed with RN permissive HTN prior to session    Exercises     Shoulder Instructions      Home Living Family/patient expects to be discharged to:: Private residence Living Arrangements: Spouse/significant other;Children Available Help at Discharge: Family;Available 24 hours/day Type of Home: House Home Access: Level entry     Home Layout: Two level Alternate Level Stairs-Number of Steps: flight Alternate Level Stairs-Rails: None Bathroom Shower/Tub: Tub/shower unit         Home Equipment: None          Prior Functioning/Environment Prior Level of Function : Independent/Modified Independent;Other (comment) (not driving)                        OT Problem List: Decreased strength;Decreased range of motion;Decreased activity tolerance;Impaired balance (sitting and/or standing);Decreased cognition;Impaired vision/perception;Decreased safety awareness      OT Treatment/Interventions: Self-care/ADL training;Therapeutic exercise;Energy conservation;DME and/or AE instruction;Therapeutic activities;Cognitive remediation/compensation;Patient/family education;Balance training    OT Goals(Current goals can be found in the care plan section) Acute Rehab OT Goals Patient Stated Goal: none stated OT Goal Formulation: With patient Time For Goal Achievement: 11/30/21 Potential to Achieve Goals: Good ADL Goals Pt Will Perform Upper Body Dressing: with min guard assist;sitting Pt Will Perform Lower Body Dressing: with min assist;sit to/from stand;sitting/lateral leans Pt Will Transfer to Toilet: with supervision;ambulating;regular height toilet Additional ADL Goal #1: pt will accurately gather 3 items needed for task in prep for ADLs Additional ADL Goal #2: Pt will complete 4 step  trailmaking task in prep for ADLs  OT Frequency: Min 2X/week    Co-evaluation PT/OT/SLP Co-Evaluation/Treatment: Yes Reason for Co-Treatment: To address functional/ADL transfers;For patient/therapist safety   OT goals addressed during session: ADL's and self-care      AM-PAC OT "  6 Clicks" Daily Activity     Outcome Measure Help from another person eating meals?: A Little Help from another person taking care of personal grooming?: A Little Help from another person toileting, which includes using toliet, bedpan, or urinal?: A Lot Help from another person bathing (including washing, rinsing, drying)?: A Lot Help from another person to put on and taking off regular upper body clothing?: A Lot Help from another person to put on and taking off regular lower body clothing?: A Lot 6 Click Score: 14   End of Session Equipment Utilized During Treatment: Gait belt Nurse Communication: Mobility status  Activity Tolerance: Patient tolerated treatment well Patient left: in bed;with call bell/phone within reach;with bed alarm set;with family/visitor present  OT Visit Diagnosis: Unsteadiness on feet (R26.81);Other abnormalities of gait and mobility (R26.89);Muscle weakness (generalized) (M62.81);Other symptoms and signs involving cognitive function;Cognitive communication deficit (R41.841)                Time: 1583-0940 OT Time Calculation (min): 25 min Charges:  OT General Charges $OT Visit: 1 Visit OT Evaluation $OT Eval Moderate Complexity: 1 [address], OTD, OTR/L Acute Rehab [phone]) 832 - [place] 11/16/2021, 12:34 PM

## 2021-11-16 NOTE — Evaluation (Signed)
Speech Language Pathology Evaluation Patient Details Name: Hayley Bryant MRN: [id number] DOB: Feb 08, 1971 Today's Date: 11/16/2021 Time: 5465-0354 SLP Time Calculation (min) (ACUTE ONLY): 22 min  Problem List:  Patient Active Problem List   Diagnosis Date Noted   Acute CVA (cerebrovascular accident) ([place]) 11/15/2021   Abnormal LFTs 11/15/2021   Hyperlipidemia associated with type 2 diabetes mellitus ([place]) 09/07/2017   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[place]) 05/12/2017   Uncontrolled type 2 diabetes mellitus with hyperglycemia ([place]) 05/07/2017   Overweight (BMI 25.0-29.9) 09/20/2013   Difficulty sleeping 09/20/2013   Vitamin D deficiency 06/21/2013   Suicidal overdose ([place]) 12/06/2012   HTN (hypertension) 12/06/2012   Type 2 diabetes mellitus ([place]) 12/06/2012   Depression 07/13/2012   Anxiety 07/13/2012   Past Medical History:  Past Medical History:  Diagnosis Date   Anxiety    Depression    Diabetes mellitus    Fibroids    Hypertension    Past Surgical History:  Past Surgical History:  Procedure Laterality Date   CHOLECYSTECTOMY     LYMPH NODE DISSECTION     HPI:  Pt is a 51 y/o F presenting to ED on 6/16 with slurred speech, CT negative, MRI revealing acute small vessel infarct in Bryant basal ganglia and adjacent white matter. PMH includes Huntington's Disease, HLD, HTN, DM, suicidal overdose, vitamin D deficiency   Assessment / Plan / Recommendation Clinical Impression  Pt presents with mild aphasia with some dysfluency, mild dysarthria, limited spontaneity of output.  Speech is hypophonic. Conversational speech is marked by short phrases with pauses due to anomia.  Responsive naming 50% accuracy; divergent naming limited to three items in a category. Followed one step commands with increasing difficulty in accuracy as complexity of commands increased. Delayed responses noted for Bryant/R body part discrimination. Pt will benefit from intensive speech/language therapy at AIR level of  care. D/W pt and her husband.    SLP Assessment  SLP Recommendation/Assessment: Patient needs continued Speech [facility] SLP Visit Diagnosis: Aphasia (R47.01)    Recommendations for follow up therapy are one component of a multi-disciplinary discharge planning process, led by the attending physician.  Recommendations may be updated based on patient status, additional functional criteria and insurance authorization.    Follow Up Recommendations  Acute inpatient rehab (3hours/day)    Assistance Recommended at Discharge  Intermittent Supervision/Assistance  Functional Status Assessment Patient has had a recent decline in their functional status and demonstrates the ability to make significant improvements in function in a reasonable and predictable amount of time.  Frequency and Duration min 2x/week  2 weeks      SLP Evaluation Cognition  Overall Cognitive Status: Impaired/Different from baseline Arousal/Alertness: Awake/alert Attention: Selective Selective Attention: Impaired       Comprehension  Auditory Comprehension Overall Auditory Comprehension: Impaired Yes/No Questions: Within Functional Limits Commands: Impaired Two Step Basic Commands: 75-100% accurate Multistep Basic Commands: 75-100% accurate Conversation: Simple Visual Recognition/Discrimination Discrimination: Within Function Limits Reading Comprehension Reading Status: Not tested    Expression Expression Primary Mode of Expression: Verbal Verbal Expression Overall Verbal Expression: Impaired Initiation: Impaired Level of Generative/Spontaneous Verbalization: Phrase Naming: Impairment Responsive: 51-75% accurate Confrontation: Impaired Convergent: 75-100% accurate Divergent: 0-24% accurate Pragmatics: No impairment Written Expression Dominant Hand: Right   Oral / Motor  Oral Motor/Sensory Function Overall Oral Motor/Sensory Function: Within functional limits Motor Speech Overall  Motor Speech: Impaired Phonation: Low vocal intensity Resonance: Within functional limits Intelligibility: Intelligible            Hayley Bryant,  Hayley Bryant 11/16/2021, 4:20 PM Hayley Bryant. Tivis Ringer, MA CCC/SLP Clinical Specialist - [place] Office number [phone]

## 2021-11-16 NOTE — Evaluation (Signed)
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 PMH includes Huntington's Disease, HLD, HTN, DM, suicidal overdose, vitamin D deficiency.   Clinical Impression  Pt presented supine in bed with HOB elevated, awake and willing to participate in therapy session. Pt's spouse and daughter present throughout evaluation. Prior to admission, pt reported that she was independent with all functional mobility and ADLs. She did not drive. Pt lives with her spouse in a two level home with a level entry. At the time of evaluation, pt able to complete bed mobility with min guard, transfers with min A and ambulated a short distance in her room with min A for stability without using an AD. Pt would continue to benefit from skilled physical therapy services at this time while admitted and after d/c to address the below listed limitations in order to improve overall safety and independence with functional mobility.  Of note, BP was elevated throughout. BP supine = 173/113 BP sitting EOB = 192/94 BP standing = 194/124 HR = 84 SpO2 = 98-100% on RA  Pt's RN notified and aware, reporting pt's permissive was up to 220.      Recommendations for follow up therapy are one component of a multi-disciplinary discharge planning process, led by the attending physician.  Recommendations may be updated based on patient status, additional functional criteria and insurance authorization.  Follow Up Recommendations Acute inpatient rehab (3hours/day)    Assistance Recommended at Discharge Frequent or constant Supervision/Assistance  Patient can return home with the following  A little help with walking and/or transfers;A little help with  bathing/dressing/bathroom;Assistance with cooking/housework;Assistance with feeding;Help with stairs or ramp for entrance    Equipment Recommendations Other (comment) (defer to next venue of care)  Recommendations for Other Services  Rehab consult    Functional Status Assessment Patient has had a recent decline in their functional status and demonstrates the ability to make significant improvements in function in a reasonable and predictable amount of time.     Precautions / Restrictions Precautions Precautions: Fall Precaution Comments: monitor BP Restrictions Weight Bearing Restrictions: No      Mobility  Bed Mobility Overal bed mobility: Needs Assistance Bed Mobility: Supine to Sit, Sit to Supine     Supine to sit: Min guard Sit to supine: Min guard   General bed mobility comments: increased time, cues to scoot to EOB    Transfers Overall transfer level: Needs assistance Equipment used: 1 person hand held assist Transfers: Sit to/from Stand Sit to Stand: Min assist           General transfer comment: pt with unsteadiness and poor motor control and coordination with transitional movement    Ambulation/Gait Ambulation/Gait assistance: Min assist, +2 safety/equipment Gait Distance (Feet): 30 Feet Assistive device: None, 1 person hand held assist Gait Pattern/deviations: Step-to pattern, Decreased stride length, Shuffle Gait velocity: greatly decreased     General Gait Details: pt with slow, cautious and guarded gait with shuffling steps bilaterally; mild unsteadiness with min A for stability and support  Stairs            Wheelchair Mobility    Modified Rankin (Stroke Patients Only) Modified Rankin (Stroke Patients Only) Pre-Morbid Rankin Score: No significant disability Modified Rankin: Moderately severe disability  Balance Overall balance assessment: Needs assistance Sitting-balance support: Feet supported, Bilateral upper extremity  supported Sitting balance-Leahy Scale: Good     Standing balance support: During functional activity, Single extremity supported Standing balance-Leahy Scale: Fair                               Pertinent Vitals/Pain Pain Assessment Pain Assessment: No/denies pain    Home Living Family/patient expects to be discharged to:: Private residence Living Arrangements: Spouse/significant other;Children Available Help at Discharge: Family;Available 24 hours/day Type of Home: House Home Access: Level entry     Alternate Level Stairs-Number of Steps: flight Home Layout: Two level Home Equipment: None      Prior Function Prior Level of Function : Independent/Modified Independent;Other (comment) (not driving)                     Hand Dominance   Dominant Hand: Right    Extremity/Trunk Assessment   Upper Extremity Assessment Upper Extremity Assessment: Defer to OT evaluation RUE Deficits / Details: 3-/5 ROM/strength RUE Coordination: decreased fine motor;decreased gross motor LUE Deficits / Details: 3+/5 ROM/strength LUE Coordination: decreased fine motor;decreased gross motor    Lower Extremity Assessment Lower Extremity Assessment: Generalized weakness    Cervical / Trunk Assessment Cervical / Trunk Assessment: Normal  Communication   Communication: Other (comment) (minimally communicative during session, low voice, responds mostly with single word replies)  Cognition Arousal/Alertness: Lethargic Behavior During Therapy: Flat affect Overall Cognitive Status: Impaired/Different from baseline Area of Impairment: Following commands, Memory, Attention, Awareness, Safety/judgement, Problem solving                   Current Attention Level: Selective Memory: Decreased short-term memory Following Commands: Follows one step commands with increased time Safety/Judgement: Decreased awareness of safety, Decreased awareness of deficits Awareness:  Intellectual Problem Solving: Slow processing, Requires verbal cues, Requires tactile cues, Difficulty sequencing General Comments: increase time for basic ADL tasks, daughter reporting pt with decreased attention/concentration compared to baseline        General Comments General comments (skin integrity, edema, etc.): BP elevated, confirmed with RN permissive HTN prior to session    Exercises     Assessment/Plan    PT Assessment Patient needs continued PT services  PT Problem List Decreased strength;Decreased activity tolerance;Decreased mobility;Decreased balance;Decreased coordination;Decreased knowledge of use of DME;Decreased safety awareness;Decreased knowledge of precautions       PT Treatment Interventions DME instruction;Gait training;Stair training;Functional mobility training;Therapeutic activities;Therapeutic exercise;Balance training;Neuromuscular re-education;Patient/family education    PT Goals (Current goals can be found in the Care Plan section)  Acute Rehab PT Goals Patient Stated Goal: return to PLOF PT Goal Formulation: With patient/family Time For Goal Achievement: 11/30/21 Potential to Achieve Goals: Good    Frequency Min 4X/week     Co-evaluation PT/OT/SLP Co-Evaluation/Treatment: Yes Reason for Co-Treatment: For patient/therapist safety;To address functional/ADL transfers PT goals addressed during session: Mobility/safety with mobility;Balance;Strengthening/ROM OT goals addressed during session: ADL's and self-care       AM-PAC PT "6 Clicks" Mobility  Outcome Measure Help needed turning from your back to your side while in a flat bed without using bedrails?: None Help needed moving from lying on your back to sitting on the side of a flat bed without using bedrails?: A Little Help needed moving to and from a bed to a chair (including a wheelchair)?: A Little Help needed standing up from a chair using your arms (e.g.,  wheelchair or bedside chair)?: A  Little Help needed to walk in hospital room?: A Little Help needed climbing 3-5 steps with a railing? : A Lot 6 Click Score: 18    End of Session Equipment Utilized During Treatment: Gait belt Activity Tolerance: Patient tolerated treatment well Patient left: in bed;with call bell/phone within reach;with bed alarm set;with family/visitor present;with SCD's reapplied Nurse Communication: Mobility status PT Visit Diagnosis: Other abnormalities of gait and mobility (R26.89)    Time: 7342-8768 PT Time Calculation (min) (ACUTE ONLY): 25 min   Charges:   PT Evaluation $PT Eval Moderate Complexity: 1 Mod          Eduard Clos, PT, DPT  Acute Rehabilitation Services Office [place] 11/16/2021, 2:20 PM

## 2021-11-16 NOTE — Progress Notes (Addendum)
STROKE TEAM PROGRESS NOTE   INTERVAL HISTORY Her husband and sister are at the bedside.  TCD with bubble ordered. Hypercoag panel ordered. Patient is oriented to name and date. Not able to repeat phrases. Able to name objects and read, mild aphasia- has some hesitancy with speech. Hypophonic voice, and mild dysarthria. Fine motor deficit on left. Tracks, EOMI, 5/5 in all 4 extremities. Sensation normal bilaterally. Has been started on ASA/ plavix.Family updated at bedside  Vitals:   11/15/21 2344 11/16/21 0020 11/16/21 0430 11/16/21 0743  BP: (!) 200/118 (!) 131/112 (!) 155/83 (!) 148/89  Pulse: 70  71   Resp: '16  18 17  '$ Temp: 98 F (36.7 C)  98.8 F (37.1 C) 98.7 F (37.1 C)  TempSrc: Oral  Oral Oral  SpO2: 100%  100%   Weight:  72.7 kg    Height:       CBC:  Recent Labs  Lab 11/15/21 1057 11/16/21 0302  WBC 8.9 7.6  NEUTROABS 6.6  --   HGB 11.3* 10.0*  HCT 35.7* 31.0*  MCV 86.4 85.6  PLT 415* 001   Basic Metabolic Panel:  Recent Labs  Lab 11/15/21 1057 11/16/21 0302  NA 136 138  K 4.6 3.1*  CL 105 105  CO2 23 20*  GLUCOSE 166* 96  BUN 10 8  CREATININE 0.58 0.67  CALCIUM 9.2 8.6*   Lipid Panel:  Recent Labs  Lab 11/16/21 0302  CHOL 129  TRIG 51  HDL 41  CHOLHDL 3.1  VLDL 10  LDLCALC 78   HgbA1c:  Recent Labs  Lab 11/16/21 0302  HGBA1C 7.7*   Urine Drug Screen:  Recent Labs  Lab 11/15/21 1216  LABOPIA NONE DETECTED  COCAINSCRNUR NONE DETECTED  LABBENZ NONE DETECTED  AMPHETMU NONE DETECTED  THCU NONE DETECTED  LABBARB NONE DETECTED    Alcohol Level  Recent Labs  Lab 11/15/21 1057  ETH <10    IMAGING past 24 hours MR BRAIN W WO CONTRAST  Result Date: 11/15/2021 CLINICAL DATA:  Neuro deficit, acute, stroke suspected Delirium EXAM: MRI HEAD WITHOUT AND WITH CONTRAST TECHNIQUE: Multiplanar, multiecho pulse sequences of the brain and surrounding structures were obtained without and with intravenous contrast. CONTRAST:  56m GADAVIST  GADOBUTROL 1 MMOL/ML IV SOLN COMPARISON:  2018 FINDINGS: Brain: Small area of restricted diffusion extending from the left lentiform nucleus superiorly to the corona radiata and caudate body. No hemorrhage. Patchy T2 hyperintensity in the supratentorial white matter is nonspecific but may reflect mild chronic microvascular ischemic changes. Prominence of the ventricles and sulci reflects parenchymal volume loss. These findings have progressed since 2018. No intracranial mass or mass effect. There is no hydrocephalus or extra-axial fluid collection. No abnormal enhancement. Vascular: Major vessel flow voids at the skull base are preserved. Skull and upper cervical spine: Normal marrow signal is preserved. Sinuses/Orbits: Paranasal sinuses are aerated. Orbits are unremarkable. Other: Sella is unremarkable.  Mastoid air cells are clear. IMPRESSION: Acute small vessel infarct involving the left basal ganglia and adjacent white matter. Mild chronic microvascular ischemic changes. Electronically Signed   By: PMacy MisM.D.   On: 11/15/2021 15:28   CT ANGIO HEAD NECK W WO CM (CODE STROKE)  Result Date: 11/15/2021 CLINICAL DATA:  Right facial droop EXAM: CT ANGIOGRAPHY HEAD AND NECK TECHNIQUE: Multidetector CT imaging of the head and neck was performed using the standard protocol during bolus administration of intravenous contrast. Multiplanar CT image reconstructions and MIPs were obtained to evaluate the vascular anatomy. Carotid  stenosis measurements (when applicable) are obtained utilizing NASCET criteria, using the distal internal carotid diameter as the denominator. RADIATION DOSE REDUCTION: This exam was performed according to the departmental dose-optimization program which includes automated exposure control, adjustment of the mA and/or kV according to patient size and/or use of iterative reconstruction technique. CONTRAST:  23m OMNIPAQUE IOHEXOL 350 MG/ML SOLN COMPARISON:  None Available. FINDINGS: CTA  NECK Aortic arch: Minimal calcified plaque. Great vessel origins are patent. Right carotid system: Patent. Mild mixed plaque along the proximal ICA with less than 50% stenosis. Left carotid system: Patent. Mild mixed plaque along the proximal ICA with less than 50% stenosis. Vertebral arteries: Patent and codominant.  No stenosis. Skeleton: No acute or significant osseous abnormality. Other neck: Unremarkable. Upper chest: 3 mm nodule of the left upper lobe. Review of the MIP images confirms the above findings CTA HEAD Anterior circulation: Intracranial internal carotid arteries are patent. Anterior and middle cerebral arteries are patent. Posterior circulation: Intracranial vertebral arteries are patent. Basilar artery is patent. Bilateral posterior communicating arteries are present. Posterior cerebral arteries are patent. Venous sinuses: Patent as allowed by contrast bolus timing. Review of the MIP images confirms the above findings IMPRESSION: No large vessel occlusion, hemodynamically significant stenosis, or evidence of dissection. Electronically Signed   By: PMacy MisM.D.   On: 11/15/2021 11:39   CT HEAD CODE STROKE WO CONTRAST  Result Date: 11/15/2021 CLINICAL DATA:  Code stroke. EXAM: CT HEAD WITHOUT CONTRAST TECHNIQUE: Contiguous axial images were obtained from the base of the skull through the vertex without intravenous contrast. RADIATION DOSE REDUCTION: This exam was performed according to the departmental dose-optimization program which includes automated exposure control, adjustment of the mA and/or kV according to patient size and/or use of iterative reconstruction technique. COMPARISON:  No prior head CT, correlation is made with MRI head 10/10/2016 FINDINGS: Brain: No evidence of acute infarction, hemorrhage, cerebral edema, mass, mass effect, or midline shift. No hydrocephalus or extra-axial collection. Mildly advanced cerebral atrophy for age. Vascular: No hyperdense vessel. Skull:  Negative for fracture or focal lesion. Sinuses/Orbits: No acute finding. Other: The mastoid air cells are well aerated. ASPECTS [facility]) - Ganglionic level infarction (caudate, lentiform nuclei, internal capsule, insula, M1-M3 cortex): 7 - Supraganglionic infarction (M4-M6 cortex): 3 Total score (0-10 with 10 being normal): 10 IMPRESSION: 1. No acute intracranial process. 2. ASPECTS is 10 Code stroke imaging results were communicated on 11/15/2021 at 11:12 am to provider [facility] telephone, who verbally acknowledged these results. Electronically Signed   By: AMerilyn BabaM.D.   On: 11/15/2021 11:14    PHYSICAL EXAM General: well nourished, well developed female in NAD. .Marland KitchenAfebrile. Head is nontraumatic. Neck is supple without bruit.    Cardiac exam no murmur or gallop. Lungs are clear to auscultation. Distal pulses are well felt.  Neurological: Patient is oriented to name and date. Not able to repeat phrases. Able to name objects and read, mild aphasia- has some hesitancy with speech. Hypophonic voice, and mild dysarthria. Fine motor deficit on left.  Orbits right over left upper extremity.  Trace left grip weakness.  Tracks, EOMI, 5/5 in all 4 extremities. Sensation normal bilaterally.   ASSESSMENT/PLAN Ms. Hayley VILLALONAis a 51y.o. female with history of Anxiety, depression, DM, HTN,  insomnia, suicidal overdose, vitamin D deficiency, and Huntington's disease who presented tot he ED for acute onset of slurred speech and confusion. NIHSS 3.   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 ASPECTS 10.    CTA head & neck -No large vessel occlusion, hemodynamically significant stenosis, or evidence of dissection.  MRI  Acute small vessel infarct involving the left basal ganglia and adjacent white matter.  Mild chronic microvascular ischemic changes  TCD with bubble ordered  2D Echo ordered EKG  SR  LDL 78 HgbA1c 7.7 VTE prophylaxis - heparin/ SCD's    Diet   Diet Heart Room service appropriate? Yes; Fluid consistency: Thin   No antithrombotic prior to admission, now on aspirin 81 mg daily and clopidogrel 75 mg daily.  For 3 weeks and then aspirin alone Therapy recommendations:  pending Disposition:  pending  Hypertension Home meds:  Zestoretic 20-'25mg'$  daily Stable Permissive hypertension (OK if < 220/120) but gradually normalize in 5-7 days Long-term BP goal normotensive  Hyperlipidemia Home meds:  atorvastatin '20mg'$ , resumed in hospital LDL 78, goal < 70 Will increase atorvastatin to '40mg'$   Continue statin at discharge  Diabetes type II UnControlled Home meds:  insulin  HgbA1c 7.7, goal < 7.0 CBGs Recent Labs    11/15/21 1053 11/16/21 0042 11/16/21 0548  GLUCAP 171* 107* 93    SSI  Other Stroke Risk Factor None   Other Active Problems Anxiety/depression - continue home meds   Hospital day # 1  Beulah Gandy DNP, ACNPC- AG   STROKE MD NOTE : I have personally obtained history,examined this patient, reviewed notes, independently viewed imaging studies, participated in medical decision making and plan of care.ROS completed by me personally and pertinent positives fully documented  I have made any additions or clarifications directly to the above note. Agree with note above.  Patient presented with speech difficulties secondary to large left basal ganglia infarct likely of cryptogenic etiology.  Recommend further evaluation with checking hypercoagulable panel and TCD bubble study for PFO.  Aspirin and Plavix for 3 weeks followed by aspirin alone and aggressive risk factor modification.  Continue ongoing therapies.  Long discussion with the patient, husband and sister at the bedside and answered questions.  Greater than 50% time during this 50-minute visit was spent in counseling and coordination of care about her stroke and discussion about evaluation and treatment  answering questions.  Antony Contras, MD Medical Director [facility] Pager: [phone] 11/16/2021 3:25 PM  To contact Stroke Continuity provider, please refer to [email or web address] After hours, contact General Neurology

## 2021-11-16 NOTE — Progress Notes (Signed)
VASCULAR LAB    TCD with bubble study has been performed.  See CV proc for preliminary results.   Julita Ozbun, RVT 11/16/2021, 2:57 PM

## 2021-11-16 NOTE — Progress Notes (Signed)
TRH floor coverage for both [facility] and [facility] (remote) on night of 11/15/21 into morning of 11/16/21:    I was notified by RN that this patient, who is admitted with acute ischemic CVA, with plan for observance of permissive hypertension, currently has no orders for prn IV antihypertensives.  Heart rates in the 60s 70s.  Subsequently placed order for prn IV hydralazine for systolic blood pressure greater than 040 or diastolic blood pressure greater than 120 mmHg.    Babs Bertin, DO Hospitalist

## 2021-11-16 NOTE — Progress Notes (Signed)
Inpatient Rehab Admissions Coordinator Note:   Per therapy patient was screened for CIR candidacy by Cannen Dupras Danford Bad, CCC-SLP. At this time, pt appears to be a potential candidate for CIR. I will place an order for rehab consult for full assessment, per our protocol.  Please contact me any with questions.Gayland Curry, [place], [place] Admissions Coordinator [phone] 11/16/21 6:19 PM

## 2021-11-16 NOTE — Progress Notes (Signed)
PROGRESS NOTE    Hayley Bryant  YQM:[id number] DOB: July 30, 1970 DOA: 11/15/2021 PCP: Barrie Lyme, FNP   Chief Complaint  Patient presents with   Aphasia    Brief Narrative:   Hayley Bryant is a 51 y.o. female with medical history significant of anxiety, depression, insomnia, suicidal overdose, vitamin D deficiency, overweight with a BMI of 25.06 kg/m, uncontrolled type II DM, hypertension, Huntington's-like disease who is brought to the emergency department after waking up in the morning with mild confusion, slurred speech and inability to communicate normally.   CT head was negative.  CTA head and neck with no large vessel occlusion, hemodynamically significant stenosis or evidence of dissection.  MRI brain with acute small vessel infarct involving the left basal ganglia and adjacent white matter.  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 -Transcranial Doppler with bubble is pending -Continue with aspirin and Plavix for now, will await further recommendations. -LDL 78. -A1c is 7.7 -PT/OT/SLP pending  Hyperkalemia -Potassium low at 3.1, repleted.    HTN (hypertension) Allow permissive hypertension.     Depression Continue fluoxetine 20 mg p.o. daily.     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[place]) Continue neurochecks. Will be evaluated by PT.     Hyperlipidemia associated with type 2 diabetes mellitus (HCC) Continue atorvastatin 20 mg p.o. daily.   Uncontrolled type 2 diabetes mellitus with hyperglycemia ([place]) Continue with Semglee, will add insulin  sliding scale, hold Trulicity and Jardiance A1C is 7.7.  Abnormal LFTs Normalized       DVT prophylaxis: Heparin Code Status: Full Family Communication: Discussed with husband and sister at bedside Disposition:   Status is: Inpatient    Consultants:  Neurology   Subjective:  No significant events overnight as discussed with staff, no new focal deficits.  Objective: Vitals:   11/15/21 2344 11/16/21 0020 11/16/21 0430 11/16/21 0743  BP: (!) 200/118 (!) 131/112 (!) 155/83 (!) 148/89  Pulse: 70  71   Resp: '16  18 17  '$ Temp: 98 F (36.7 C)  98.8 F (37.1 C) 98.7 F (37.1 C)  TempSrc: Oral  Oral Oral  SpO2: 100%  100%   Weight:  72.7 kg    Height:        Intake/Output Summary (Last 24 hours) at 11/16/2021 1233 Last data filed at 11/16/2021 0700 Gross per 24 hour  Intake 567.13 ml  Output --  Net 567.13 ml   Filed Weights   11/15/21 1211 11/16/21 0020  Weight: 72.6 kg 72.7 kg    Examination:  Awake Alert, Oriented X 3, she is with impaired speech, right-sided weakness. Symmetrical Chest wall movement, Good air movement bilaterally, CTAB RRR,No Gallops,Rubs or new Murmurs, No Parasternal Heave +ve B.Sounds, Abd Soft, No tenderness, No rebound - guarding or rigidity. No Cyanosis, Clubbing or edema, No new Rash or bruise      Data Reviewed: I have personally reviewed following labs and imaging studies  CBC: Recent Labs  Lab 11/15/21 1057 11/16/21 0302  WBC 8.9 7.6  NEUTROABS 6.6  --   HGB 11.3* 10.0*  HCT 35.7* 31.0*  MCV 86.4 85.6  PLT 415* 097    Basic Metabolic Panel: Recent Labs  Lab 11/15/21 1057 11/16/21 0302  NA 136 138  K 4.6 3.1*  CL 105 105  CO2 23 20*  GLUCOSE 166* 96  BUN 10 8  CREATININE 0.58 0.67  CALCIUM 9.2 8.6*    GFR: Estimated Creatinine Clearance: 81.8 mL/min (by C-G formula based on SCr of 0.67 mg/dL).  Liver Function Tests: Recent Labs  Lab 11/15/21 1057 11/16/21 0302  AST 52* 18  ALT 24 13  ALKPHOS 61 50   BILITOT 1.6* 1.0  PROT 8.5* 6.4*  ALBUMIN 4.6 3.4*    CBG: Recent Labs  Lab 11/15/21 1053 11/16/21 0042 11/16/21 0548  GLUCAP 171* 107* 93     Recent Results (from the past 240 hour(s))  Resp Panel by RT-PCR (Flu A&B, Covid) Anterior Nasal Swab     Status: None   Collection Time: 11/15/21 12:16 PM   Specimen: Anterior Nasal Swab  Result Value Ref Range Status   SARS Coronavirus 2 by RT PCR NEGATIVE NEGATIVE Final    Comment: (NOTE) SARS-CoV-2 target nucleic acids are NOT DETECTED.  The SARS-CoV-2 RNA is generally detectable in upper respiratory specimens during the acute phase of infection. The lowest concentration of SARS-CoV-2 viral copies this assay can detect is 138 copies/mL. A negative result does not preclude SARS-Cov-2 infection and should not be used as the sole basis for treatment or other patient management decisions. A negative result may occur with  improper specimen collection/handling, submission of specimen other than nasopharyngeal swab, presence of viral mutation(s) within the areas targeted by this assay, and inadequate number of viral copies(<138 copies/mL). A negative result must be combined with clinical observations, patient history, and epidemiological information. The expected result is Negative.  Fact Sheet for Patients:  EntrepreneurPulse.com.au  Fact Sheet for Healthcare Providers:  IncredibleEmployment.be  This test is no t yet approved or cleared by the [place] FDA and  has been authorized for detection and/or diagnosis of SARS-CoV-2 by FDA under an Emergency Use Authorization (EUA). This EUA will remain  in effect (meaning this test can be used) for the duration of the COVID-19 declaration under Section 564(b)(1) of the Act, 21 U.S.C.section 360bbb-3(b)(1), unless the authorization is terminated  or revoked sooner.       Influenza A by PCR NEGATIVE NEGATIVE Final   Influenza B by PCR  NEGATIVE NEGATIVE Final    Comment: (NOTE) The Xpert Xpress SARS-CoV-2/FLU/RSV plus assay is intended as an aid in the diagnosis of influenza from Nasopharyngeal swab specimens and should not be used as a sole basis for treatment. Nasal washings and aspirates are unacceptable for Xpert Xpress SARS-CoV-2/FLU/RSV testing.  Fact Sheet for Patients: EntrepreneurPulse.com.au  Fact Sheet for Healthcare Providers: IncredibleEmployment.be  This test is not yet approved or cleared by the [place] FDA and has been authorized for detection and/or diagnosis of SARS-CoV-2 by FDA under an Emergency Use Authorization (EUA). This EUA will remain in effect (meaning this test can be used) for the duration of the COVID-19 declaration under Section 564(b)(1) of the Act, 21 U.S.C. section 360bbb-3(b)(1), unless the authorization is terminated or revoked.  Performed at [facility], [place] [address] [place], [place] [address]          Radiology Studies: MR BRAIN W WO CONTRAST  Result Date: 11/15/2021 CLINICAL DATA:  Neuro deficit, acute, stroke suspected Delirium EXAM: MRI HEAD WITHOUT AND WITH CONTRAST TECHNIQUE: Multiplanar, multiecho pulse sequences of  the brain and surrounding structures were obtained without and with intravenous contrast. CONTRAST:  63m GADAVIST GADOBUTROL 1 MMOL/ML IV SOLN COMPARISON:  2018 FINDINGS: Brain: Small area of restricted diffusion extending from the left lentiform nucleus superiorly to the corona radiata and caudate body. No hemorrhage. Patchy T2 hyperintensity in the supratentorial white matter is nonspecific but may reflect mild chronic microvascular ischemic changes. Prominence of the ventricles and sulci reflects parenchymal volume loss. These findings have progressed since 2018. No intracranial mass or mass effect. There is no hydrocephalus or extra-axial fluid collection. No abnormal enhancement. Vascular:  Major vessel flow voids at the skull base are preserved. Skull and upper cervical spine: Normal marrow signal is preserved. Sinuses/Orbits: Paranasal sinuses are aerated. Orbits are unremarkable. Other: Sella is unremarkable.  Mastoid air cells are clear. IMPRESSION: Acute small vessel infarct involving the left basal ganglia and adjacent white matter. Mild chronic microvascular ischemic changes. Electronically Signed   By: PMacy MisM.D.   On: 11/15/2021 15:28   CT ANGIO HEAD NECK W WO CM (CODE STROKE)  Result Date: 11/15/2021 CLINICAL DATA:  Right facial droop EXAM: CT ANGIOGRAPHY HEAD AND NECK TECHNIQUE: Multidetector CT imaging of the head and neck was performed using the standard protocol during bolus administration of intravenous contrast. Multiplanar CT image reconstructions and MIPs were obtained to evaluate the vascular anatomy. Carotid stenosis measurements (when applicable) are obtained utilizing NASCET criteria, using the distal internal carotid diameter as the denominator. RADIATION DOSE REDUCTION: This exam was performed according to the departmental dose-optimization program which includes automated exposure control, adjustment of the mA and/or kV according to patient size and/or use of iterative reconstruction technique. CONTRAST:  669mOMNIPAQUE IOHEXOL 350 MG/ML SOLN COMPARISON:  None Available. FINDINGS: CTA NECK Aortic arch: Minimal calcified plaque. Great vessel origins are patent. Right carotid system: Patent. Mild mixed plaque along the proximal ICA with less than 50% stenosis. Left carotid system: Patent. Mild mixed plaque along the proximal ICA with less than 50% stenosis. Vertebral arteries: Patent and codominant.  No stenosis. Skeleton: No acute or significant osseous abnormality. Other neck: Unremarkable. Upper chest: 3 mm nodule of the left upper lobe. Review of the MIP images confirms the above findings CTA HEAD Anterior circulation: Intracranial internal carotid arteries are  patent. Anterior and middle cerebral arteries are patent. Posterior circulation: Intracranial vertebral arteries are patent. Basilar artery is patent. Bilateral posterior communicating arteries are present. Posterior cerebral arteries are patent. Venous sinuses: Patent as allowed by contrast bolus timing. Review of the MIP images confirms the above findings IMPRESSION: No large vessel occlusion, hemodynamically significant stenosis, or evidence of dissection. Electronically Signed   By: PrMacy Mis.D.   On: 11/15/2021 11:39   CT HEAD CODE STROKE WO CONTRAST  Result Date: 11/15/2021 CLINICAL DATA:  Code stroke. EXAM: CT HEAD WITHOUT CONTRAST TECHNIQUE: Contiguous axial images were obtained from the base of the skull through the vertex without intravenous contrast. RADIATION DOSE REDUCTION: This exam was performed according to the departmental dose-optimization program which includes automated exposure control, adjustment of the mA and/or kV according to patient size and/or use of iterative reconstruction technique. COMPARISON:  No prior head CT, correlation is made with MRI head 10/10/2016 FINDINGS: Brain: No evidence of acute infarction, hemorrhage, cerebral edema, mass, mass effect, or midline shift. No hydrocephalus or extra-axial collection. Mildly advanced cerebral atrophy for age. Vascular: No hyperdense vessel. Skull: Negative for fracture or focal lesion. Sinuses/Orbits: No acute finding. Other: The mastoid air cells are well aerated.  ASPECTS [facility] Stroke Program Early CT Score) - Ganglionic level infarction (caudate, lentiform nuclei, internal capsule, insula, M1-M3 cortex): 7 - Supraganglionic infarction (M4-M6 cortex): 3 Total score (0-10 with 10 being normal): 10 IMPRESSION: 1. No acute intracranial process. 2. ASPECTS is 10 Code stroke imaging results were communicated on 11/15/2021 at 11:12 am to provider [facility] via telephone, who verbally acknowledged these results. Electronically Signed    By: Merilyn Baba M.D.   On: 11/15/2021 11:14        Scheduled Meds:  aspirin  300 mg Rectal Daily   Or   aspirin  325 mg Oral Daily   atorvastatin  20 mg Oral Daily   clopidogrel  75 mg Oral Daily   FLUoxetine  60 mg Oral Daily   insulin glargine-yfgn  17 Units Subcutaneous Daily   metroNIDAZOLE  500 mg Oral BID   Continuous Infusions:  sodium chloride 100 mL/hr at 11/16/21 0700     LOS: 1 day        Phillips Climes, MD [facility]   To contact the attending provider between 7A-7P or the covering provider during after hours 7P-7A, please log into the web site [email or web address] and access using universal [place] password for that web site. If you do not have the password, please call the hospital operator.  11/16/2021, 12:33 PM

## 2021-11-17 ENCOUNTER — Inpatient Hospital Stay (HOSPITAL_COMMUNITY): Payer: Managed Care, Other (non HMO)

## 2021-11-17 DIAGNOSIS — I10 Essential (primary) hypertension: Secondary | ICD-10-CM

## 2021-11-17 DIAGNOSIS — E1169 Type 2 diabetes mellitus with other specified complication: Secondary | ICD-10-CM | POA: Diagnosis not present

## 2021-11-17 DIAGNOSIS — I639 Cerebral infarction, unspecified: Secondary | ICD-10-CM | POA: Diagnosis not present

## 2021-11-17 DIAGNOSIS — I6389 Other cerebral infarction: Secondary | ICD-10-CM

## 2021-11-17 DIAGNOSIS — E785 Hyperlipidemia, unspecified: Secondary | ICD-10-CM

## 2021-11-17 DIAGNOSIS — E1165 Type 2 diabetes mellitus with hyperglycemia: Secondary | ICD-10-CM

## 2021-11-17 LAB — ECHOCARDIOGRAM COMPLETE
AR max vel: 3.25 cm2
AV Area VTI: 3.59 cm2
AV Area mean vel: 3.26 cm2
AV Mean grad: 4 mmHg
AV Peak grad: 6.9 mmHg
Ao pk vel: 1.31 m/s
Area-P 1/2: 5.42 cm2
Calc EF: 65.4 %
Height: 67 in
S' Lateral: 2.2 cm
Single Plane A2C EF: 64.4 %
Single Plane A4C EF: 66.5 %
Weight: 2564.39 oz

## 2021-11-17 LAB — CBC
HCT: 29.6 % — ABNORMAL LOW (ref 36.0–46.0)
Hemoglobin: 9.6 g/dL — ABNORMAL LOW (ref 12.0–15.0)
MCH: 28 pg (ref 26.0–34.0)
MCHC: 32.4 g/dL (ref 30.0–36.0)
MCV: 86.3 fL (ref 80.0–100.0)
Platelets: 345 10*3/uL (ref 150–400)
RBC: 3.43 MIL/uL — ABNORMAL LOW (ref 3.87–5.11)
RDW: 15 % (ref 11.5–15.5)
WBC: 7 10*3/uL (ref 4.0–10.5)
nRBC: 0 % (ref 0.0–0.2)

## 2021-11-17 LAB — BASIC METABOLIC PANEL
Anion gap: 10 (ref 5–15)
BUN: 11 mg/dL (ref 6–20)
CO2: 24 mmol/L (ref 22–32)
Calcium: 8.9 mg/dL (ref 8.9–10.3)
Chloride: 105 mmol/L (ref 98–111)
Creatinine, Ser: 0.65 mg/dL (ref 0.44–1.00)
GFR, Estimated: >60 mL/min (ref >60)
Glucose, Bld: 114 mg/dL — ABNORMAL HIGH (ref 70–99)
Potassium: 3.5 mmol/L (ref 3.5–5.1)
Sodium: 139 mmol/L (ref 135–145)

## 2021-11-17 LAB — BETA-2-GLYCOPROTEIN I ABS, IGG/M/A
Beta-2 Glyco I IgG: <9 GPI IgG units (ref 0–20)
Beta-2-Glycoprotein I IgA: <9 GPI IgA units (ref 0–25)
Beta-2-Glycoprotein I IgM: <9 GPI IgM units (ref 0–32)

## 2021-11-17 LAB — GLUCOSE, CAPILLARY
Glucose-Capillary: 131 mg/dL — ABNORMAL HIGH (ref 70–99)
Glucose-Capillary: 169 mg/dL — ABNORMAL HIGH (ref 70–99)
Glucose-Capillary: 133 mg/dL — ABNORMAL HIGH (ref 70–99)
Glucose-Capillary: 222 mg/dL — ABNORMAL HIGH (ref 70–99)

## 2021-11-17 NOTE — Progress Notes (Signed)
PROGRESS NOTE    Hayley Bryant  UYQ:[id number] DOB: [birth year] DOA: 11/15/2021 PCP: Barrie Lyme, FNP   Chief Complaint  Patient presents with   Aphasia    Brief Narrative:   Hayley Bryant is a 51 y.o. female with medical history significant of anxiety, depression, insomnia, suicidal overdose, vitamin D deficiency, overweight with a BMI of 25.06 kg/m, uncontrolled type II DM, hypertension, Huntington's-like disease who is brought to the emergency department after waking up in the morning with mild confusion, slurred speech and inability to communicate normally.   CT head was negative.  CTA head and neck with no large vessel occlusion, hemodynamically significant stenosis or evidence of dissection.  MRI brain with acute small vessel infarct involving the left basal ganglia and adjacent white matter.  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 -A1c is 7.7 -PT/OT/SLP consulted, recommendation for CIR  Hyperkalemia -Repleted    HTN (hypertension) Allow permissive hypertension.     Depression Continue fluoxetine 20 mg p.o. daily.     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 Will be evaluated by PT.     Hyperlipidemia associated with type 2 diabetes mellitus (HCC) LDL of 78, increase atorvastatin from 20 to 40 mg    Uncontrolled type 2 diabetes mellitus with hyperglycemia (HCC) Continue with Semglee, will add insulin sliding scale, hold Trulicity and Jardiance A1C is 7.7.  Abnormal LFTs Normalized       DVT prophylaxis: Heparin Code Status: Full Family Communication: Discussed with husband  at bedside Disposition:   Status is: Inpatient    Consultants:  Neurology   Subjective:  No significant events overnight  Objective: Vitals:   11/16/21 2020 11/17/21 0020 11/17/21 0420 11/17/21 0819  BP: (!) 147/98 (!) 152/86 (!) 165/97 (!) 150/83  Pulse: 82 62 64 67  Resp: '18 18 14 16  '$ Temp: 98.3 F (36.8 C)   98 F (36.7 C)  TempSrc: Oral   Oral  SpO2: 100% 100% 100% 100%  Weight:      Height:        Intake/Output Summary (Last 24 hours) at 11/17/2021 1400 Last data filed at 11/17/2021 0729 Gross per 24 hour  Intake 664.05 ml  Output --  Net 664.05 ml   Filed Weights   11/15/21 1211 11/16/21 0020  Weight: 72.6 kg 72.7 kg    Examination:  Awake Alert, Oriented X 3, with mild dysarthria Symmetrical Chest wall movement, Good air movement bilaterally, CTAB RRR,No Gallops,Rubs or new Murmurs, No Parasternal Heave +ve B.Sounds, Abd Soft, No tenderness, No rebound - guarding or rigidity. No Cyanosis, Clubbing or edema, No new Rash or bruise      Data Reviewed: I have personally reviewed following labs and imaging studies  CBC: Recent Labs  Lab 11/15/21 1057 11/16/21 0302 11/17/21 0250  WBC 8.9 7.6 7.0  NEUTROABS 6.6  --   --  HGB 11.3* 10.0* 9.6*  HCT 35.7* 31.0* 29.6*  MCV 86.4 85.6 86.3  PLT 415* 329 161    Basic Metabolic Panel: Recent Labs  Lab 11/15/21 1057 11/16/21 0302 11/17/21 0250  NA 136 138 139  K 4.6 3.1* 3.5  CL 105 105 105  CO2 23 20* 24  GLUCOSE 166* 96 114*  BUN '10 8 11  '$ CREATININE 0.58 0.67 0.65  CALCIUM 9.2 8.6* 8.9    GFR: Estimated Creatinine Clearance: 81.8 mL/min (by C-G formula based on SCr of 0.65 mg/dL).  Liver Function  Tests: Recent Labs  Lab 11/15/21 1057 11/16/21 0302  AST 52* 18  ALT 24 13  ALKPHOS 61 50  BILITOT 1.6* 1.0  PROT 8.5* 6.4*  ALBUMIN 4.6 3.4*    CBG: Recent Labs  Lab 11/16/21 1623 11/16/21 2232 11/16/21 2301 11/17/21 0559 11/17/21 1209  GLUCAP 189* 251* 210* 131* 169*     Recent Results (from the past 240 hour(s))  Resp Panel by RT-PCR (Flu A&B, Covid) Anterior Nasal Swab     Status: None   Collection Time: 11/15/21 12:16 PM   Specimen: Anterior Nasal Swab  Result Value Ref Range Status   SARS Coronavirus 2 by RT PCR NEGATIVE NEGATIVE Final    Comment: (NOTE) SARS-CoV-2 target nucleic acids are NOT DETECTED.  The SARS-CoV-2 RNA is generally detectable in upper respiratory specimens during the acute phase of infection. The lowest concentration of SARS-CoV-2 viral copies this assay can detect is 138 copies/mL. A negative result does not preclude SARS-Cov-2 infection and should not be used as the sole basis for treatment or other patient management decisions. A negative result may occur with  improper specimen collection/handling, submission of specimen other than nasopharyngeal swab, presence of viral mutation(s) within the areas targeted by this assay, and inadequate number of viral copies(<138 copies/mL). A negative result must be combined with clinical observations, patient history, and epidemiological information. The expected result is Negative.  Fact Sheet for Patients:  EntrepreneurPulse.com.au  Fact Sheet for Healthcare Providers:  IncredibleEmployment.be  This test is no t yet approved or cleared by the [place] FDA and  has been authorized for detection and/or diagnosis of SARS-CoV-2 by FDA under an Emergency Use Authorization (EUA). This EUA will remain  in effect (meaning this test can be used) for the duration of the COVID-19 declaration under Section 564(b)(1) of the Act, 21 U.S.C.section 360bbb-3(b)(1),  unless the authorization is terminated  or revoked sooner.       Influenza A by PCR NEGATIVE NEGATIVE Final   Influenza B by PCR NEGATIVE NEGATIVE Final    Comment: (NOTE) The Xpert Xpress SARS-CoV-2/FLU/RSV plus assay is intended as an aid in the diagnosis of influenza from Nasopharyngeal swab specimens and should not be used as a sole basis for treatment. Nasal washings and aspirates are unacceptable for Xpert Xpress SARS-CoV-2/FLU/RSV testing.  Fact Sheet for Patients: EntrepreneurPulse.com.au  Fact Sheet for Healthcare Providers: IncredibleEmployment.be  This test is not yet approved or cleared by the [place] FDA and has been authorized for detection and/or diagnosis of SARS-CoV-2 by FDA under an Emergency Use Authorization (EUA). This EUA will remain in effect (meaning this test can be used) for the duration of the COVID-19 declaration under Section 564(b)(1) of the Act, 21 U.S.C. section 360bbb-3(b)(1), unless the authorization is terminated or revoked.  Performed at [facility], [place] [address] [place], [place] [address]          Radiology Studies: ECHOCARDIOGRAM COMPLETE  Result Date: 11/17/2021    ECHOCARDIOGRAM REPORT   Patient Name:   NAVAYA WIATREK Date of Exam: 11/17/2021 Medical Rec #:  [id number]       Height:       67.0 in Accession #:    [id number]      Weight:       160.3 lb Date of Birth:  [birth year]        BSA:          1.841 m Patient Age:    5 years        BP:           150/83 mmHg Patient Gender: F               HR:           76 bpm. Exam Location:  Inpatient Procedure: 2D Echo, Cardiac Doppler and Color Doppler Indications:    Stroke  History:        Patient has no prior history of Echocardiogram examinations.                 Risk Factors:Hypertension and Diabetes.  Sonographer:    Joette Catching RCS Referring Phys: [phone] [place]  1. Left ventricular ejection fraction,  by estimation, is 60 to 65%. The left ventricle has normal function. The left ventricle has no regional wall motion abnormalities. There is severe concentric left ventricular hypertrophy. Left ventricular diastolic  parameters are consistent with Grade I diastolic dysfunction (impaired relaxation).  2. Right ventricular systolic function is normal. The right ventricular size is normal. There is normal pulmonary artery systolic pressure. The estimated right ventricular systolic pressure is 17.6 mmHg.  3. The mitral valve is normal in structure. Trivial mitral valve regurgitation. No evidence of mitral stenosis.  4. The aortic valve is tricuspid. Aortic valve regurgitation is not visualized. Aortic valve sclerosis/calcification is present, without any evidence of aortic stenosis. Aortic valve area, by VTI measures 3.59 cm. Aortic valve mean gradient measures 4.0 mmHg. Aortic valve Vmax measures 1.31 m/s.  5. The inferior vena cava is normal in size with greater than 50% respiratory variability, suggesting right atrial pressure of 3 mmHg. FINDINGS  Left Ventricle: Left ventricular ejection fraction, by estimation, is 60 to 65%. The left ventricle has normal function. The left ventricle has no regional wall motion abnormalities. The left ventricular internal cavity size was normal in size. There is  severe concentric left ventricular hypertrophy. Left ventricular diastolic parameters are consistent with Grade I diastolic dysfunction (impaired relaxation). Normal left ventricular filling pressure. Right Ventricle: The right ventricular size is normal. No increase in right ventricular wall thickness. Right ventricular systolic function is normal. There is normal pulmonary artery systolic pressure. The tricuspid regurgitant velocity is 2.10 m/s, and  with an assumed right atrial pressure of 3 mmHg, the estimated right ventricular systolic pressure is 16.0 mmHg. Left Atrium: Left atrial size was normal in size. Right  Atrium: Right atrial size was normal in size. Pericardium: There is no evidence of pericardial effusion. Mitral Valve: The mitral valve is normal in structure. Trivial mitral valve regurgitation. No evidence of mitral valve stenosis. Tricuspid Valve: The tricuspid valve is normal in structure. Tricuspid valve regurgitation is trivial. No evidence of tricuspid stenosis. Aortic Valve: The aortic valve is tricuspid. Aortic valve regurgitation is not visualized. Aortic valve sclerosis/calcification is present, without any evidence of aortic stenosis. Aortic valve mean gradient measures 4.0 mmHg. Aortic valve peak gradient measures 6.9 mmHg. Aortic  valve area, by VTI measures 3.59 cm. Pulmonic Valve: The pulmonic valve was normal in structure. Pulmonic valve regurgitation is trivial. No evidence of pulmonic stenosis. Aorta: The aortic root is normal in size and structure. Venous: The inferior vena cava is normal in size with greater than 50% respiratory variability, suggesting right atrial pressure of 3 mmHg. IAS/Shunts: No atrial level shunt detected by color flow Doppler.  LEFT VENTRICLE PLAX 2D LVIDd:         3.40 cm     Diastology LVIDs:         2.20 cm     LV e' medial:    6.53 cm/s LV PW:         1.50 cm     LV E/e' medial:  8.8 LV IVS:        1.60 cm     LV e' lateral:   11.30 cm/s LVOT diam:     2.10 cm     LV E/e' lateral: 5.1 LV SV:         81 LV SV Index:   44 LVOT Area:     3.46 cm  LV Volumes (MOD) LV vol d, MOD A2C: 50.6 ml LV vol d, MOD A4C: 51.0 ml LV vol s, MOD A2C: 18.0 ml LV vol s, MOD A4C: 17.1 ml LV SV MOD A2C:     32.6 ml LV SV MOD A4C:     51.0 ml LV SV MOD BP:      33.3 ml RIGHT VENTRICLE             IVC RV Basal diam:  2.90 cm     IVC diam: 1.90 cm RV Mid diam:    2.20 cm RV S prime:     14.40 cm/s TAPSE (M-mode): 2.2 cm LEFT ATRIUM             Index        RIGHT ATRIUM           Index LA diam:        3.20 cm 1.74 cm/m   RA Area:     10.30 cm LA Vol (A2C):   50.0 ml 27.16 ml/m  RA Volume:    19.00 ml  10.32 ml/m LA Vol (A4C):   40.8 ml 22.17 ml/m LA Biplane Vol: 44.7 ml 24.29 ml/m  AORTIC VALVE                    PULMONIC VALVE AV Area (Vmax):    3.25 cm     PV Vmax:          1.04 m/s AV Area (Vmean):   3.26 cm     PV Peak grad:     4.3 mmHg AV Area (VTI):     3.59 cm     PR End Diast Vel: 3.46 msec AV Vmax:           131.00 cm/s AV Vmean:          99.700 cm/s AV VTI:            0.226 m AV Peak Grad:      6.9 mmHg AV Mean Grad:      4.0 mmHg LVOT Vmax:         123.00 cm/s LVOT Vmean:        93.800 cm/s LVOT VTI:          0.234 m LVOT/AV VTI ratio: 1.04  AORTA Ao Root diam: 3.00 cm  Ao Asc diam:  3.40 cm MITRAL VALVE               TRICUSPID VALVE MV Area (PHT): 5.42 cm    TR Peak grad:   17.6 mmHg MV Decel Time: 140 msec    TR Vmax:        210.00 cm/s MV E velocity: 57.40 cm/s MV A velocity: 93.40 cm/s  SHUNTS MV E/A ratio:  0.61        Systemic VTI:  0.23 m                            Systemic Diam: 2.10 cm Fransico Him MD Electronically signed by Fransico Him MD Signature Date/Time: 11/17/2021/12:41:09 PM    Final    VAS [place] TRANSCRANIAL DOPPLER W BUBBLES  Result Date: 11/16/2021  Transcranial Doppler with Bubble Patient Name:  KATY BRICKELL  Date of Exam:   11/16/2021 Medical Rec #: [id number]        Accession #:    [id number] Date of Birth: 18-Apr-1971         Patient Gender: F Patient Age:   72 years Exam [facility]  [facility] Procedure:      VAS [place] TRANSCRANIAL DOPPLER W BUBBLES Referring Phys: Beulah Gandy --------------------------------------------------------------------------------  Indications: Stroke. History: Uncontrolled type II DM, hypertension, confusion, slurred speech and inability to communicate normally. Comparison Study: No prior study on file Performing Technologist: Sharion Dove RVS  Examination Guidelines: A complete evaluation includes B-mode imaging, spectral Doppler, color Doppler, and power Doppler as needed of all accessible portions of each vessel. Bilateral  testing is considered an integral part of a complete examination. Limited examinations for reoccurring indications may be performed as noted.  Summary: No HITS at rest or during Valsalva. Negative transcranial Doppler Bubble study with no evidence of right to left intracardiac communication.  *See table(s) above for TCD measurements and observations.    Preliminary    MR BRAIN W WO CONTRAST  Result Date: 11/15/2021 CLINICAL DATA:  Neuro deficit, acute, stroke suspected Delirium EXAM: MRI HEAD WITHOUT AND WITH CONTRAST TECHNIQUE: Multiplanar, multiecho pulse sequences of the brain and surrounding structures were obtained without and with intravenous contrast. CONTRAST:  20m GADAVIST GADOBUTROL 1 MMOL/ML IV SOLN COMPARISON:  2018 FINDINGS: Brain: Small area of restricted diffusion extending from the left lentiform nucleus superiorly to the corona radiata and caudate body. No hemorrhage. Patchy T2 hyperintensity in the supratentorial white matter is nonspecific but may reflect mild chronic microvascular ischemic changes. Prominence of the ventricles and sulci reflects parenchymal volume loss. These findings have progressed since 2018. No intracranial mass or mass effect. There is no hydrocephalus or extra-axial fluid collection. No abnormal enhancement. Vascular: Major vessel flow voids at the skull base are preserved. Skull and upper cervical spine: Normal marrow signal is preserved. Sinuses/Orbits: Paranasal sinuses are aerated. Orbits are unremarkable. Other: Sella is unremarkable.  Mastoid air cells are clear. IMPRESSION: Acute small vessel infarct involving the left basal ganglia and adjacent white matter. Mild chronic microvascular ischemic changes. Electronically Signed   By: PMacy MisM.D.   On: 11/15/2021 15:28        Scheduled Meds:  aspirin EC  81 mg Oral Daily   atorvastatin  40 mg Oral Daily   clopidogrel  75 mg Oral Daily   FLUoxetine  60 mg Oral Daily   heparin injection  (subcutaneous)  5,000 Units Subcutaneous Q8H   lisinopril  20  mg Oral Daily   And   hydrochlorothiazide  25 mg Oral Daily   insulin aspart  0-5 Units Subcutaneous QHS   insulin aspart  0-9 Units Subcutaneous TID WC   insulin glargine-yfgn  17 Units Subcutaneous Daily   metroNIDAZOLE  500 mg Oral BID   pantoprazole  40 mg Oral Daily   Continuous Infusions:     LOS: 2 days        Phillips Climes, MD [facility]   To contact the attending provider between 7A-7P or the covering provider during after hours 7P-7A, please log into the web site [email or web address] and access using universal [place] password for that web site. If you do not have the password, please call the hospital operator.  11/17/2021, 2:00 PM

## 2021-11-17 NOTE — Progress Notes (Signed)
  Echocardiogram 2D Echocardiogram has been performed.  Hayley Bryant 11/17/2021, 10:11 AM

## 2021-11-17 NOTE — Progress Notes (Signed)
Occupational Therapy Treatment Patient Details Name: Hayley Bryant MRN: [id number] DOB: 11-23-1970 Today's Date: 11/17/2021   History of present illness Pt is a 51 y/o F presenting to ED on 6/16 with slurred speech, CT negative, MRI revealing acute small vessel infarct in L basal ganglia and adjacent white matter. PMH includes Huntington's Disease, HLD, HTN, DM, suicidal overdose, vitamin D deficiency.   OT comments  Pt progressing towards goals, session focused on ADLs and RUE mobility. Pt completing standing grooming task with use of built up handle on toothbrush, pt able to brush teeth with minA, cues for sequencing and pt with mild perseveration during task. Pt able to complete RUE exercises seated EOB, fatigues quickly and requires increased concentration. Educated family/pt on use of built up handle for feeding utensils encouraged pt/family to continue to use RUE for functional tasks and work on exercises to improve mobility. Pt and family verbalized understanding. Pt presenting with impairments listed below, will follow acutely. Continue to recommend AIR at d/c.   Recommendations for follow up therapy are one component of a multi-disciplinary discharge planning process, led by the attending physician.  Recommendations may be updated based on patient status, additional functional criteria and insurance authorization.    Follow Up Recommendations  Acute inpatient rehab (3hours/day)    Assistance Recommended at Discharge Intermittent Supervision/Assistance  Patient can return home with the following  A little help with walking and/or transfers;A lot of help with bathing/dressing/bathroom;Assistance with cooking/housework;Direct supervision/assist for medications management;Direct supervision/assist for financial management;Assist for transportation;Help with stairs or ramp for entrance   Equipment Recommendations  BSC/3in1    Recommendations for Other Services PT consult;Rehab consult     Precautions / Restrictions Precautions Precautions: Fall Precaution Comments: monitor BP Restrictions Weight Bearing Restrictions: No       Mobility Bed Mobility Overal bed mobility: Needs Assistance Bed Mobility: Supine to Sit, Sit to Supine     Supine to sit: Min guard Sit to supine: Mod assist   General bed mobility comments: cues for sequencing and safety of RUE, pt not attending to RUE with movement when returning to bed    Transfers Overall transfer level: Needs assistance Equipment used: 1 person hand held assist Transfers: Sit to/from Stand Sit to Stand: Min assist           General transfer comment: pt with unsteadiness and poor motor control and coordination with transitional movement     Balance Overall balance assessment: Needs assistance Sitting-balance support: Feet supported, Bilateral upper extremity supported Sitting balance-Leahy Scale: Good     Standing balance support: During functional activity, Single extremity supported Standing balance-Leahy Scale: Fair                             ADL either performed or assessed with clinical judgement   ADL Overall ADL's : Needs assistance/impaired     Grooming: Minimal assistance;Standing;Cueing for sequencing;Cueing for safety Grooming Details (indicate cue type and reason): with built up handle, cues for sequencing                 Toilet Transfer: Min guard;Ambulation;Regular Glass blower/designer Details (indicate cue type and reason): simulated in room         Functional mobility during ADLs: Minimal assistance;Cueing for safety;Min guard      Extremity/Trunk Assessment Upper Extremity Assessment RUE Deficits / Details: 3-/5 ROM/strength RUE Coordination: decreased fine motor;decreased gross motor LUE Deficits / Details: 3+/5 ROM/strength LUE Coordination:  decreased fine motor;decreased gross motor   Lower Extremity Assessment Lower Extremity Assessment: Defer to  PT evaluation        Vision   Vision Assessment?: Vision impaired- to be further tested in functional context Additional Comments: spits toothpaste onto counter vs into skin, possible depth perception difficulty? will further assess   Perception Perception Perception: Not tested   Praxis Praxis Praxis: Not tested    Cognition Arousal/Alertness: Lethargic Behavior During Therapy: Flat affect Overall Cognitive Status: Impaired/Different from baseline Area of Impairment: Following commands, Memory, Attention, Awareness, Safety/judgement, Problem solving                   Current Attention Level: Selective Memory: Decreased short-term memory Following Commands: Follows one step commands with increased time Safety/Judgement: Decreased awareness of safety, Decreased awareness of deficits Awareness: Intellectual Problem Solving: Slow processing, Requires verbal cues, Requires tactile cues, Difficulty sequencing          Exercises Exercises: Other exercises Other Exercises Other Exercises: R digit abduction/adduction x10 Other Exercises: R shoulder flexion/functional reach x10 Other Exercises: R wrist flex/extension x10 Other Exercises: R isolated digit extesion x10 Other Exercises: R thumb opposition x10    Shoulder Instructions       General Comments VSS on RA, spouse present and supportive during session    Pertinent Vitals/ Pain       Pain Assessment Pain Assessment: No/denies pain  Home Living                                          Prior Functioning/Environment              Frequency  Min 2X/week        Progress Toward Goals  OT Goals(current goals can now be found in the care plan section)  Progress towards OT goals: Progressing toward goals  Acute Rehab OT Goals Patient Stated Goal: none stated OT Goal Formulation: With patient Time For Goal Achievement: 11/30/21 Potential to Achieve Goals: Good ADL Goals Pt Will  Perform Upper Body Dressing: with min guard assist;sitting Pt Will Perform Lower Body Dressing: with min assist;sit to/from stand;sitting/lateral leans Pt Will Transfer to Toilet: with supervision;ambulating;regular height toilet Additional ADL Goal #1: pt will accurately gather 3 items needed for task in prep for ADLs Additional ADL Goal #2: Pt will complete 4 step trailmaking task in prep for ADLs  Plan Frequency remains appropriate;Discharge plan remains appropriate    Co-evaluation                 AM-PAC OT "6 Clicks" Daily Activity     Outcome Measure   Help from another person eating meals?: A Little Help from another person taking care of personal grooming?: A Little Help from another person toileting, which includes using toliet, bedpan, or urinal?: A Lot Help from another person bathing (including washing, rinsing, drying)?: A Lot Help from another person to put on and taking off regular upper body clothing?: A Lot Help from another person to put on and taking off regular lower body clothing?: A Little 6 Click Score: 15    End of Session Equipment Utilized During Treatment: Gait belt  OT Visit Diagnosis: Unsteadiness on feet (R26.81);Other abnormalities of gait and mobility (R26.89);Muscle weakness (generalized) (M62.81);Other symptoms and signs involving cognitive function;Cognitive communication deficit (R41.841)   Activity Tolerance Patient tolerated treatment well   Patient Left in  bed;with call bell/phone within reach;with bed alarm set;with family/visitor present   Nurse Communication Mobility status        Time: 1740-8144 OT Time Calculation (min): 26 min  Charges: OT General Charges $OT Visit: 1 Visit OT Treatments $Self Care/Home Management : 8-22 mins $Therapeutic Exercise: 8-22 mins  Lynnda Child, OTD, OTR/L Acute Rehab [phone]) [phone] [place] 11/17/2021, 4:05 PM

## 2021-11-17 NOTE — Progress Notes (Signed)
Inpatient Rehab Admissions:  Inpatient Rehab Consult received.  I met with patient and husband at the bedside for rehabilitation assessment and to discuss goals and expectations of an inpatient rehab admission.  Both acknowledged understanding of CIR goals and expectations. Pt interested in pursuing CIR and husband supportive. Husband confirmed that he and sons will be able to provide 24/7 support for pt after discharge. Will continue to follow  Signed: Gayland Curry, [place], [place] Admissions Coordinator [phone]

## 2021-11-17 NOTE — PMR Pre-admission (Shared)
PMR Admission Coordinator Pre-Admission Assessment  Patient: Hayley Bryant is an 51 y.o., female MRN: [id number] DOB: 06/04/1970 Height: 5' 7"  (170.2 cm) Weight: 72.7 kg  Insurance Information HMO: yes    PPO:      PCP:      IPA:      80/20:      OTHER:  PRIMARY: [employer] Managed      Policy#: [id number]      Subscriber: patient CM Name: Butch Penny      Phone#: [phone]     Fax#: [phone] Pre-Cert#: [id number]      Employer: [employer] Benefits:  Phone #: [phone]     Name: 11/18/21 Eff. Date: 06/02/21-06/01/22     Deduct: does not have individual, family $2,200   Out of Pocket Max: $4,200   Life Max: NA CIR: 75% coverage, 25% co-insurance    SNF: 75% coverage, 25% co-insurance Outpatient: $25 copay/visit     Co-Pay:  Home Health: 75% coverage, 25% co-insurance      Co-Pay:  DME: 100% coverage     Co-Pay:  Providers: in-network  SECONDARY:  none   Financial Counselor:       Phone#:   The Therapist, art Information Summary" for patients in Inpatient Rehabilitation Facilities with attached "Privacy Act [place] Records" was provided and verbally reviewed with: N/A  Emergency Contact Information Contact Information     Name Relation Home Work Mobile   Dickerson,[name] Spouse [phone]  [phone]   [facility] Sister   [phone]   Shanda, Cadotte   [phone]      Current Medical History  Patient Admitting Diagnosis: CVA  History of Present Illness: ****   Complete NIHSS TOTAL: 4  Patient's medical [facility] from [facility] has been reviewed by the rehabilitation admission coordinator and physician.  Past Medical History  Past Medical History:  Diagnosis Date   Anxiety    Depression    Diabetes mellitus    Fibroids    Hypertension    Has the patient had major surgery during 100 days prior to admission? No  Family History   family history includes Heart attack in her mother; Huntington's disease in her maternal  grandmother and mother; Hypertension in her mother.  Current Medications  Current Facility-Administered Medications:    acetaminophen (TYLENOL) tablet 650 mg, 650 mg, Oral, Q6H PRN, 650 mg at 11/19/21 2325 **OR** acetaminophen (TYLENOL) suppository 650 mg, 650 mg, Rectal, Q6H PRN, Reubin Milan, MD   aspirin EC tablet 81 mg, 81 mg, Oral, Daily, Rosalin Hawking, MD, 81 mg at 11/21/21 4008   atorvastatin (LIPITOR) tablet 40 mg, 40 mg, Oral, Daily, Beulah Gandy A, NP, 40 mg at 11/21/21 6761   [COMPLETED] clopidogrel (PLAVIX) tablet 300 mg, 300 mg, Oral, Once, 300 mg at 11/15/21 1522 **FOLLOWED BY** clopidogrel (PLAVIX) tablet 75 mg, 75 mg, Oral, Daily, Reome, Earle J, RPH, 75 mg at 11/21/21 0937   FLUoxetine (PROZAC) capsule 60 mg, 60 mg, Oral, Daily, Reubin Milan, MD, 60 mg at 11/21/21 9509   heparin injection 5,000 Units, 5,000 Units, Subcutaneous, Q8H, Elgergawy, Silver Huguenin, MD, 5,000 Units at 11/21/21 1317   hydrALAZINE (APRESOLINE) tablet 25 mg, 25 mg, Oral, Q4H PRN, Dwyane Dee, MD   lisinopril (ZESTRIL) tablet 20 mg, 20 mg, Oral, Daily, 20 mg at 11/21/21 0937 **AND** hydrochlorothiazide (HYDRODIURIL) tablet 25 mg, 25 mg, Oral, Daily, Dwyane Dee, MD, 25 mg at 11/21/21 0937   insulin aspart (novoLOG) injection 0-5 Units, 0-5 Units, Subcutaneous, QHS,  Elgergawy, Silver Huguenin, MD, 2 Units at 11/20/21 2150   insulin aspart (novoLOG) injection 0-9 Units, 0-9 Units, Subcutaneous, TID WC, Elgergawy, Silver Huguenin, MD, 3 Units at 11/21/21 1317   insulin glargine-yfgn [facility]) injection 17 Units, 17 Units, Subcutaneous, Daily, Reubin Milan, MD, 17 Units at 11/21/21 [phone]   labetalol (NORMODYNE) injection 10 mg, 10 mg, Intravenous, Q4H PRN, Dwyane Dee, MD, 10 mg at 11/20/21 1956   melatonin tablet 10 mg, 10 mg, Oral, QHS PRN, Reubin Milan, MD, 10 mg at 11/18/21 2136   methocarbamol (ROBAXIN) tablet 500 mg, 500 mg, Oral, BID PRN, Reubin Milan, MD, 500 mg at 11/19/21 0017    ondansetron (ZOFRAN) tablet 4 mg, 4 mg, Oral, Q6H PRN **OR** ondansetron (ZOFRAN) injection 4 mg, 4 mg, Intravenous, Q6H PRN, Reubin Milan, MD   pantoprazole (PROTONIX) EC tablet 40 mg, 40 mg, Oral, Daily, Elgergawy, Silver Huguenin, MD, 40 mg at 11/21/21 2094  Patients Current Diet:  Diet Order             Diet Heart Room service appropriate? Yes; Fluid consistency: Thin  Diet effective now                  Precautions / Restrictions Precautions Precautions: Fall, Other (comment) Precaution Comments: monitor BP Restrictions Weight Bearing Restrictions: Yes   Has the patient had 2 or more falls or a fall with injury in the past year? No  Prior Activity Level Community (5-7x/wk): gets out of house 3-4 days/week; works  Prior Functional Level Self Care: Did the patient need help bathing, dressing, using the toilet or eating? Independent  Indoor Mobility: Did the patient need assistance with walking from room to room (with or without device)? Independent  Stairs: Did the patient need assistance with internal or external stairs (with or without device)? Independent  Functional Cognition: Did the patient need help planning regular tasks such as shopping or remembering to take medications? Independent  Patient Information Are you of Hispanic, Latino/a,or Spanish origin?: A. No, not of Hispanic, Latino/a, or Spanish origin What is your race?: B. Black or African American Do you need or want an interpreter to communicate with a doctor or health care staff?: 0. No  Patient's Response To:  Health Literacy and Transportation Is the patient able to respond to health literacy and transportation needs?: No Health Literacy - How often do you need to have someone help you when you read instructions, pamphlets, or other written material from your doctor or pharmacy?: Never In the past 12 months, has lack of transportation kept you from medical appointments or from getting medications?:  No In the past 12 months, has lack of transportation kept you from meetings, work, or from getting things needed for daily living?: No  Home Assistive Devices / [place] Devices/Equipment: CBG Meter, Eyeglasses Home Equipment: None  Prior Device Use: Indicate devices/aids used by the patient prior to current illness, exacerbation or injury? None of the above  Current Functional Level Cognition  Arousal/Alertness: Awake/alert Overall Cognitive Status: Impaired/Different from baseline Current Attention Level: Selective Orientation Level: Oriented X4 Following Commands: Follows one step commands with increased time, Follows multi-step commands inconsistently Safety/Judgement: Decreased awareness of safety, Decreased awareness of deficits General Comments: pt continues to be soft spoken, with R inattention and delayed processing, pt is initiating using R UE more to assist with functional tasks although difficult Attention: Selective Selective Attention: Impaired    Extremity Assessment (includes Sensation/Coordination)  Upper Extremity Assessment: Defer  to OT evaluation RUE Deficits / Details: 3-/5 ROM/strength RUE Coordination: decreased fine motor, decreased gross motor LUE Deficits / Details: 3+/5 ROM/strength LUE Coordination: decreased fine motor, decreased gross motor  Lower Extremity Assessment: Defer to PT evaluation    ADLs  Overall ADL's : Needs assistance/impaired Eating/Feeding: Minimal assistance, Sitting Grooming: Standing, Cueing for sequencing, Cueing for safety, Min guard Grooming Details (indicate cue type and reason): with built up handle, cues for sequencing Upper Body Bathing: Moderate assistance, Sitting Lower Body Bathing: Moderate assistance, Sitting/lateral leans Upper Body Dressing : Moderate assistance, Sitting Lower Body Dressing: Moderate assistance, Sitting/lateral leans Toilet Transfer: Min guard, Ambulation, Regular Toilet Toilet  Transfer Details (indicate cue type and reason): simulated in room Toileting- Clothing Manipulation and Hygiene: Min guard, Sitting/lateral lean, Sit to/from stand Toileting - Clothing Manipulation Details (indicate cue type and reason): for pericare Functional mobility during ADLs: Cueing for safety, Min guard    Mobility  Overal bed mobility: Needs Assistance Bed Mobility: Supine to Sit Supine to sit: HOB elevated, Min guard Sit to supine: Min guard, HOB elevated General bed mobility comments: increased time, pt able to take covers off using her R hand with increased time, verbal cues to scoot to EOB to bring both feet on the floor, no physical assist need only verbal cues    Transfers  Overall transfer level: Needs assistance Equipment used: None Transfers: Sit to/from Stand Sit to Stand: Min assist General transfer comment: minA to power up, increased time, inc weightbearing on L LE, tactile cues to keep even Wbing between LEs    Ambulation / Gait / Stairs / Emergency planning/management officer  Ambulation/Gait Ambulation/Gait assistance: Herbalist (Feet): 50 Feet Assistive device: 1 person hand held assist (on L to simulate cane however pt minimally used L hand to stabilize during R LE advancement) Gait Pattern/deviations: Step-to pattern, Decreased stride length, Shuffle, Decreased weight shift to right, Decreased stance time - right General Gait Details: pt slow and guarded, max verbal cues to increased step length and width of base of support however unable to maintain. Pt with inc RR to 32 and HR in 120s, pt reports "I'm short of breath" once returned to sitting RR down to 20, HR to 100bpm, suspect anxiety related Gait velocity: decreased Gait velocity interpretation: <1.31 ft/sec, indicative of household ambulator    Posture / Balance Balance Overall balance assessment: Needs assistance Sitting-balance support: Feet supported, No upper extremity supported Sitting balance-Leahy  Scale: Good Standing balance support: During functional activity Standing balance-Leahy Scale: Fair Standing balance comment: pt stood at tolieted in squat to try to perform pericare and change maxi pad without LOB for approx 90 sec    Special needs/care consideration Hgb A1c 7.7   Previous Home Environment  Living Arrangements: Spouse/significant other (2 adult  sons live with them and a daughter lives outside the home)  Lives With: Spouse, Family Available Help at Discharge:  (spouse self employed, will have to work to arrange coverage) Type of Home: House Home Layout: Two level, 1/2 bath on main level Alternate Level Stairs-Rails: None Alternate Level Stairs-Number of Steps: 13 Home Access: Level entry Bathroom Shower/Tub: Chiropodist: Standard Bathroom Accessibility: Yes How Accessible: Accessible via walker [place]: No  Discharge Living Setting Plans for Discharge Living Setting: Patient's home, Lives with (comment) (spouse and 2 adult sons) Type of Home at Discharge: House Discharge Home Layout: Two level, 1/2 bath on main level, Bed/bath upstairs Alternate Level Stairs-Rails: None Alternate Level Stairs-Number  of Steps: 13 Discharge Home Access: Level entry Discharge Bathroom Shower/Tub: Tub/shower unit Discharge Bathroom Toilet: Standard Discharge Bathroom Accessibility: Yes How Accessible: Accessible via walker Does the patient have any problems obtaining your medications?: No  Social/Family/Support Systems Patient Roles: Spouse, Parent (employee) Contact Information: spouse, [name] Anticipated Caregiver: spouse, sons and daughter Anticipated Ambulance person Information: see contacts Ability/Limitations of Caregiver: all work so will have to arrange caregiver schedules Caregiver Availability: 24/7 Discharge Plan Discussed with Primary Caregiver: Yes Is Caregiver In Agreement with Plan?: Yes Does Caregiver/Family have Issues with  Lodging/Transportation while Pt is in Rehab?: No  Goals Patient/Family Goal for Rehab: supervision to min with PT, OT and SLP Expected length of stay: ELOS 10 to 12 days Pt/Family Agrees to Admission and willing to participate: Yes Program Orientation Provided & Reviewed with Pt/Caregiver Including Roles  & Responsibilities: Yes  Decrease burden of Care through IP rehab admission: NA  Possible need for SNF placement upon discharge: Not anticipated  Patient Condition: I have reviewed medical records from [facility], spoken with CM, and patient and spouse. I met with patient at the bedside for inpatient rehabilitation assessment.  Patient will benefit from ongoing PT, OT, and SLP, can actively participate in 3 hours of therapy a day 5 days of the week, and can make measurable gains during the admission.  Patient will also benefit from the coordinated team approach during an Inpatient Acute Rehabilitation admission.  The patient will receive intensive therapy as well as Rehabilitation physician, nursing, social worker, and care management interventions.  Due to safety, disease management, medication administration, pain management, and patient education the patient requires 24 hour a day rehabilitation nursing.  The patient is currently *** with mobility and basic ADLs.  Discharge setting and therapy post discharge at home with home health is anticipated.  Patient has agreed to participate in the Acute Inpatient Rehabilitation Program and will admit today.  Preadmission Screen Completed By:  Danne Baxter RN MSN, 11/21/2021 5:46 PM ______________________________________________________________________   Discussed status with Dr. Marland Kitchen on *** at *** and received approval for admission today.  Admission Coordinator:   Danne Baxter RN MSN time Marland KitchenSudie Grumbling ***   Assessment/Plan: Diagnosis: Does the need for close, 24 hr/day Medical supervision in concert with the patient's rehab needs make it  unreasonable for this patient to be served in a less intensive setting? {yes_no_potentially:3041433} Co-Morbidities requiring supervision/potential complications: *** Due to {due QZ:0092330}, does the patient require 24 hr/day rehab nursing? {yes_no_potentially:3041433} Does the patient require coordinated care of a physician, rehab nurse, PT, OT, and SLP to address physical and functional deficits in the context of the above medical diagnosis(es)? {yes_no_potentially:3041433} Addressing deficits in the following areas: {deficits:3041436} Can the patient actively participate in an intensive therapy program of at least 3 hrs of therapy 5 days a week? {yes_no_potentially:3041433} The potential for patient to make measurable gains while on inpatient rehab is {potential:3041437} Anticipated functional outcomes upon discharge from inpatient rehab: {functional outcomes:304600100} PT, {functional outcomes:304600100} OT, {functional outcomes:304600100} SLP Estimated rehab length of stay to reach the above functional goals is: *** Anticipated discharge destination: {anticipated dc setting:21604} 10. Overall Rehab/Functional Prognosis: {potential:3041437}   MD Signature: ***

## 2021-11-17 NOTE — Progress Notes (Addendum)
STROKE TEAM PROGRESS NOTE   INTERVAL HISTORY Her husband is at the bedside.  Patient lying in bed, still has dysarthria and mild right-sided weakness.  PT sed rate recommend CIR.  Vitals:   11/16/21 2020 11/17/21 0020 11/17/21 0420 11/17/21 0819  BP: (!) 147/98 (!) 152/86 (!) 165/97 (!) 150/83  Pulse: 82 62 64 67  Resp: '18 18 14 16  '$ Temp: 98.3 F (36.8 C)   98 F (36.7 C)  TempSrc: Oral   Oral  SpO2: 100% 100% 100% 100%  Weight:      Height:       CBC:  Recent Labs  Lab 11/15/21 1057 11/16/21 0302 11/17/21 0250  WBC 8.9 7.6 7.0  NEUTROABS 6.6  --   --   HGB 11.3* 10.0* 9.6*  HCT 35.7* 31.0* 29.6*  MCV 86.4 85.6 86.3  PLT 415* 329 546   Basic Metabolic Panel:  Recent Labs  Lab 11/16/21 0302 11/17/21 0250  NA 138 139  K 3.1* 3.5  CL 105 105  CO2 20* 24  GLUCOSE 96 114*  BUN 8 11  CREATININE 0.67 0.65  CALCIUM 8.6* 8.9   Lipid Panel:  Recent Labs  Lab 11/16/21 0302  CHOL 129  TRIG 51  HDL 41  CHOLHDL 3.1  VLDL 10  LDLCALC 78   HgbA1c:  Recent Labs  Lab 11/16/21 0302  HGBA1C 7.7*   Urine Drug Screen:  Recent Labs  Lab 11/15/21 1216  LABOPIA NONE DETECTED  COCAINSCRNUR NONE DETECTED  LABBENZ NONE DETECTED  AMPHETMU NONE DETECTED  THCU NONE DETECTED  LABBARB NONE DETECTED    Alcohol Level  Recent Labs  Lab 11/15/21 1057  ETH <10    IMAGING past 24 hours ECHOCARDIOGRAM COMPLETE  Result Date: 11/17/2021    ECHOCARDIOGRAM REPORT   Patient Name:   Hayley Bryant Date of Exam: 11/17/2021 Medical Rec #:  [id number]       Height:       67.0 in Accession #:    [id number]      Weight:       160.3 lb Date of Birth:  September 15, 1970        BSA:          1.841 m Patient Age:    51 years        BP:           150/83 mmHg Patient Gender: F               HR:           76 bpm. Exam Location:  Inpatient Procedure: 2D Echo, Cardiac Doppler and Color Doppler Indications:    Stroke  History:        Patient has no prior history of Echocardiogram examinations.                  Risk Factors:Hypertension and Diabetes.  Sonographer:    Joette Catching RCS Referring Phys: [phone] [place]  1. Left ventricular ejection fraction, by estimation, is 60 to 65%. The left ventricle has normal function. The left ventricle has no regional wall motion abnormalities. There is severe concentric left ventricular hypertrophy. Left ventricular diastolic  parameters are consistent with Grade I diastolic dysfunction (impaired relaxation).  2. Right ventricular systolic function is normal. The right ventricular size is normal. There is normal pulmonary artery systolic pressure. The estimated right ventricular systolic pressure is 59.1 mmHg.  3. The mitral valve is normal in structure. Trivial mitral  valve regurgitation. No evidence of mitral stenosis.  4. The aortic valve is tricuspid. Aortic valve regurgitation is not visualized. Aortic valve sclerosis/calcification is present, without any evidence of aortic stenosis. Aortic valve area, by VTI measures 3.59 cm. Aortic valve mean gradient measures 4.0 mmHg. Aortic valve Vmax measures 1.31 m/s.  5. The inferior vena cava is normal in size with greater than 50% respiratory variability, suggesting right atrial pressure of 3 mmHg. FINDINGS  Left Ventricle: Left ventricular ejection fraction, by estimation, is 60 to 65%. The left ventricle has normal function. The left ventricle has no regional wall motion abnormalities. The left ventricular internal cavity size was normal in size. There is  severe concentric left ventricular hypertrophy. Left ventricular diastolic parameters are consistent with Grade I diastolic dysfunction (impaired relaxation). Normal left ventricular filling pressure. Right Ventricle: The right ventricular size is normal. No increase in right ventricular wall thickness. Right ventricular systolic function is normal. There is normal pulmonary artery systolic pressure. The tricuspid regurgitant velocity is 2.10 m/s,  and  with an assumed right atrial pressure of 3 mmHg, the estimated right ventricular systolic pressure is 16.6 mmHg. Left Atrium: Left atrial size was normal in size. Right Atrium: Right atrial size was normal in size. Pericardium: There is no evidence of pericardial effusion. Mitral Valve: The mitral valve is normal in structure. Trivial mitral valve regurgitation. No evidence of mitral valve stenosis. Tricuspid Valve: The tricuspid valve is normal in structure. Tricuspid valve regurgitation is trivial. No evidence of tricuspid stenosis. Aortic Valve: The aortic valve is tricuspid. Aortic valve regurgitation is not visualized. Aortic valve sclerosis/calcification is present, without any evidence of aortic stenosis. Aortic valve mean gradient measures 4.0 mmHg. Aortic valve peak gradient measures 6.9 mmHg. Aortic valve area, by VTI measures 3.59 cm. Pulmonic Valve: The pulmonic valve was normal in structure. Pulmonic valve regurgitation is trivial. No evidence of pulmonic stenosis. Aorta: The aortic root is normal in size and structure. Venous: The inferior vena cava is normal in size with greater than 50% respiratory variability, suggesting right atrial pressure of 3 mmHg. IAS/Shunts: No atrial level shunt detected by color flow Doppler.  LEFT VENTRICLE PLAX 2D LVIDd:         3.40 cm     Diastology LVIDs:         2.20 cm     LV e' medial:    6.53 cm/s LV PW:         1.50 cm     LV E/e' medial:  8.8 LV IVS:        1.60 cm     LV e' lateral:   11.30 cm/s LVOT diam:     2.10 cm     LV E/e' lateral: 5.1 LV SV:         81 LV SV Index:   44 LVOT Area:     3.46 cm  LV Volumes (MOD) LV vol d, MOD A2C: 50.6 ml LV vol d, MOD A4C: 51.0 ml LV vol s, MOD A2C: 18.0 ml LV vol s, MOD A4C: 17.1 ml LV SV MOD A2C:     32.6 ml LV SV MOD A4C:     51.0 ml LV SV MOD BP:      33.3 ml RIGHT VENTRICLE             IVC RV Basal diam:  2.90 cm     IVC diam: 1.90 cm RV Mid diam:    2.20 cm RV S prime:  14.40 cm/s TAPSE (M-mode): 2.2 cm  LEFT ATRIUM             Index        RIGHT ATRIUM           Index LA diam:        3.20 cm 1.74 cm/m   RA Area:     10.30 cm LA Vol (A2C):   50.0 ml 27.16 ml/m  RA Volume:   19.00 ml  10.32 ml/m LA Vol (A4C):   40.8 ml 22.17 ml/m LA Biplane Vol: 44.7 ml 24.29 ml/m  AORTIC VALVE                    PULMONIC VALVE AV Area (Vmax):    3.25 cm     PV Vmax:          1.04 m/s AV Area (Vmean):   3.26 cm     PV Peak grad:     4.3 mmHg AV Area (VTI):     3.59 cm     PR End Diast Vel: 3.46 msec AV Vmax:           131.00 cm/s AV Vmean:          99.700 cm/s AV VTI:            0.226 m AV Peak Grad:      6.9 mmHg AV Mean Grad:      4.0 mmHg LVOT Vmax:         123.00 cm/s LVOT Vmean:        93.800 cm/s LVOT VTI:          0.234 m LVOT/AV VTI ratio: 1.04  AORTA Ao Root diam: 3.00 cm Ao Asc diam:  3.40 cm MITRAL VALVE               TRICUSPID VALVE MV Area (PHT): 5.42 cm    TR Peak grad:   17.6 mmHg MV Decel Time: 140 msec    TR Vmax:        210.00 cm/s MV E velocity: 57.40 cm/s MV A velocity: 93.40 cm/s  SHUNTS MV E/A ratio:  0.61        Systemic VTI:  0.23 m                            Systemic Diam: 2.10 cm Fransico Him MD Electronically signed by Fransico Him MD Signature Date/Time: 11/17/2021/12:41:09 PM    Final    VAS [place] TRANSCRANIAL DOPPLER W BUBBLES  Result Date: 11/16/2021  Transcranial Doppler with Bubble Patient Name:  LOREDA SILVERIO  Date of Exam:   11/16/2021 Medical Rec #: [id number]        Accession #:    [id number] Date of Birth: 1970-12-10         Patient Gender: F Patient Age:   6 years Exam Location:  [facility] Procedure:      VAS [place] TRANSCRANIAL DOPPLER W BUBBLES Referring Phys: Beulah Gandy --------------------------------------------------------------------------------  Indications: Stroke. History: Uncontrolled type II DM, hypertension, confusion, slurred speech and inability to communicate normally. Comparison Study: No prior study on file Performing Technologist: Sharion Dove RVS   Examination Guidelines: A complete evaluation includes B-mode imaging, spectral Doppler, color Doppler, and power Doppler as needed of all accessible portions of each vessel. Bilateral testing is considered an integral part of a complete examination. Limited examinations for reoccurring indications may be performed as noted.  Summary: No HITS at rest or during Valsalva. Negative transcranial Doppler Bubble study with no evidence of right to left intracardiac communication.  *See table(s) above for TCD measurements and observations.    Preliminary     PHYSICAL EXAM  Temp:  [98 F (36.7 C)-98.3 F (36.8 C)] 98 F (36.7 C) (06/18 0819) Pulse Rate:  [62-82] 67 (06/18 0819) Resp:  [14-19] 16 (06/18 0819) BP: (147-199)/(83-100) 150/83 (06/18 0819) SpO2:  [100 %] 100 % (06/18 0819)  General - Well nourished, well developed, in no apparent distress, mildly lethargic.  Ophthalmologic - fundi not visualized due to noncooperation.  Cardiovascular - Regular rhythm and rate.  Neuro - awake, alert, mildly lethargic, eyes open, orientated to age, place, time and people. No aphasia, paucity of speech with mild to moderate dysarthria, following all simple commands. Able to name and repeat and read. No gaze palsy, tracking bilaterally, visual field full, PERRL.  Mild right facial droop. Tongue midline.  Left upper and lower extremity 4/5, right upper extremity 4/5 proximal and 4 -/5 distally, right lower extremity proximal 3+/5, distal 3/5. Sensation symmetrical bilaterally, b/l FTN intact, although slow on the right, gait not tested.    ASSESSMENT/PLAN Ms. Hayley Bryant is a 51 y.o. female with history of Anxiety, depression, DM, HTN,  insomnia, suicidal overdose, vitamin D deficiency, and Huntington's disease who presented tot he ED for acute onset of slurred speech and confusion. NIHSS 3.   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 ASPECTS 10.    CTA head & neck -No large vessel occlusion, hemodynamically significant stenosis, or evidence of dissection. MRI  Acute small vessel infarct involving the left basal ganglia and adjacent white matter.   TCD bubble study no PFO 2D Echo EF 60 to 65% LDL 78 HgbA1c 7.7 Hypercoag work-up pending UDS negative VTE prophylaxis - heparin/ SCD's No antithrombotic prior to admission, now on aspirin 81 mg daily and clopidogrel 75 mg daily.  For 3 weeks and then aspirin alone Therapy recommendations: CIR Disposition:  pending  Hypertensive urgency Home meds:  Zestoretic 20-'25mg'$  daily Stable on the high and Resume home meds Gradually normalize in 2-3 days Long-term BP goal normotensive  Hyperlipidemia Home meds:  atorvastatin '20mg'$   LDL 78, goal < 70 Increased atorvastatin to '40mg'$   Continue statin at discharge  Diabetes type II UnControlled Home meds:  insulin  HgbA1c 7.7, goal < 7.0 CBGs Hyperglycemia SSI Close PCP follow-up for better DM control  Other Stroke Risk Factor None   Other Active Problems Anxiety/depression - continue home meds  Huntington's disease  Hospital day # 2  Neurology will sign off. Please call with questions. Pt will follow up with Dr. Jaynee Eagles at [facility] in about 4 weeks. Thanks for the consult.  Rosalin Hawking, MD PhD Stroke Neurology 11/17/2021 1:04 PM   To contact Stroke Continuity provider, please refer to [email or web address] After hours, contact General Neurology

## 2021-11-18 DIAGNOSIS — I10 Essential (primary) hypertension: Secondary | ICD-10-CM | POA: Diagnosis not present

## 2021-11-18 DIAGNOSIS — E1169 Type 2 diabetes mellitus with other specified complication: Secondary | ICD-10-CM | POA: Diagnosis not present

## 2021-11-18 DIAGNOSIS — E785 Hyperlipidemia, unspecified: Secondary | ICD-10-CM | POA: Diagnosis not present

## 2021-11-18 DIAGNOSIS — I639 Cerebral infarction, unspecified: Secondary | ICD-10-CM | POA: Diagnosis not present

## 2021-11-18 LAB — PROTEIN C, TOTAL: Protein C, Total: 88 % (ref 60–150)

## 2021-11-18 LAB — GLUCOSE, CAPILLARY
Glucose-Capillary: 130 mg/dL — ABNORMAL HIGH (ref 70–99)
Glucose-Capillary: 172 mg/dL — ABNORMAL HIGH (ref 70–99)
Glucose-Capillary: 182 mg/dL — ABNORMAL HIGH (ref 70–99)
Glucose-Capillary: 177 mg/dL — ABNORMAL HIGH (ref 70–99)

## 2021-11-18 LAB — HOMOCYSTEINE: Homocysteine: 11.6 umol/L (ref 0.0–14.5)

## 2021-11-18 LAB — CARDIOLIPIN ANTIBODIES, IGG, IGM, IGA
Anticardiolipin IgA: <9 APL U/mL (ref 0–11)
Anticardiolipin IgG: <9 GPL U/mL (ref 0–14)
Anticardiolipin IgM: <9 MPL U/mL (ref 0–12)

## 2021-11-18 NOTE — Progress Notes (Signed)
Inpatient Rehab Admissions Coordinator:  Began insurance authorization. Will continue to follow.   Minaal Struckman Graves Madden, MS, CCC-SLP Admissions Coordinator [phone]  

## 2021-11-18 NOTE — Progress Notes (Signed)
  Transition of Care [facility]) Screening Note   Patient Details  Name: Hayley Bryant Date of Birth: Sep 15, 1970   Transition of Care [facility]) CM/SW Contact:    Pollie Friar, RN Phone Number: 11/18/2021, 1:46 PM   Patient admitted with CVA. She is from home with spouse. CIR has begun insurance auth for admission.  Transition of Care Department [facility]) has reviewed patient. We will continue to monitor patient advancement through interdisciplinary progression rounds. If new patient transition needs arise, please place a TOC consult.

## 2021-11-18 NOTE — Plan of Care (Signed)

## 2021-11-18 NOTE — Progress Notes (Signed)
Physical Therapy Treatment Patient Details Name: Hayley Bryant MRN: [id number] DOB: 10-24-1970 Today's Date: 11/18/2021   History of Present Illness Pt is a 51 y/o F presenting to ED on 6/16 with slurred speech, CT negative, MRI revealing acute small vessel infarct in L basal ganglia and adjacent white matter. PMH includes Huntington's Disease, HLD, HTN, DM, suicidal overdose, vitamin D deficiency.    PT Comments    Patient progressing well towards PT goals. Session focused on gait training, functional transfers and cognitive training. Requires min A for standing with cues for WB through RUE/RLE with emphasis on equal WB through BLEs for ascent/descent. Pt with slurred and low toned speech. Needs Min A for ambulation due to RLE instability and balance. Noted to have some difficulty with dual tasking during walking needing to stop and process/respond. Continues to have deficits related to following commands, safety, awareness and problem solving. Pt with supportive spouse and motivated to improve. Pt is not close to functional baseline and would be a great AIR candidate.  Will follow.   Recommendations for follow up therapy are one component of a multi-disciplinary discharge planning process, led by the attending physician.  Recommendations may be updated based on patient status, additional functional criteria and insurance authorization.  Follow Up Recommendations  Acute inpatient rehab (3hours/day)     Assistance Recommended at Discharge Frequent or constant Supervision/Assistance  Patient can return home with the following A little help with walking and/or transfers;A little help with bathing/dressing/bathroom;Assistance with cooking/housework;Assistance with feeding;Help with stairs or ramp for entrance;Assist for transportation   Equipment Recommendations  Other (comment) (defer)    Recommendations for Other Services       Precautions / Restrictions Precautions Precautions:  Fall;Other (comment) Precaution Comments: monitor BP Restrictions Weight Bearing Restrictions: No     Mobility  Bed Mobility Overal bed mobility: Needs Assistance Bed Mobility: Supine to Sit, Sit to Supine     Supine to sit: HOB elevated, Mod assist Sit to supine: Min guard, HOB elevated   General bed mobility comments: Increased time and difficulty with reciprocal scooting to EOB needing Mod cues to scoot bottom and to weight shift. Cues to encourage using RUE    Transfers Overall transfer level: Needs assistance Equipment used: None Transfers: Sit to/from Stand Sit to Stand: Min assist, Min guard           General transfer comment: Min A to power to standing with cues to push through RUE/RLE and for anterior translation as well as slow controlled descent wtih emphasis on equal WB through BLEs esp RLE. Stood from EOB x5, from toilet x1.    Ambulation/Gait Ambulation/Gait assistance: Min assist, Min guard Gait Distance (Feet): 60 Feet Assistive device: None Gait Pattern/deviations: Step-to pattern, Decreased stride length, Shuffle, Step-through pattern, Decreased weight shift to right Gait velocity: decreased Gait velocity interpretation: <1.31 ft/sec, indicative of household ambulator   General Gait Details: pt with slow, cautious and guarded gait with right knee instability but no buckling. + stop and talk test. Able to do path finding. Min A for support/balance esp with balance challenges. RUE held against body.   Stairs             Wheelchair Mobility    Modified Rankin (Stroke Patients Only) Modified Rankin (Stroke Patients Only) Pre-Morbid Rankin Score: No significant disability Modified Rankin: Moderately severe disability     Balance Overall balance assessment: Needs assistance Sitting-balance support: Feet supported, No upper extremity supported Sitting balance-Leahy Scale: Good  Standing balance support: During functional  activity Standing balance-Leahy Scale: Fair Standing balance comment: Able to stand at sink and wash hands with close Min gaurd due to instability.                            Cognition Arousal/Alertness: Awake/alert Behavior During Therapy: Flat affect Overall Cognitive Status: Impaired/Different from baseline Area of Impairment: Attention, Memory, Following commands, Safety/judgement, Awareness, Problem solving                   Current Attention Level: Selective Memory: Decreased short-term memory Following Commands: Follows one step commands with increased time, Follows multi-step commands inconsistently Safety/Judgement: Decreased awareness of safety, Decreased awareness of deficits Awareness: Intellectual Problem Solving: Slow processing, Requires verbal cues, Requires tactile cues, Difficulty sequencing General Comments: Requires repetition and multi modal cues to perform tasks greater than 2 steps. Slurred, low toned speech. Difficulty with dual tasking-naming tasks during walking needing to stop and think. Right inattention, needs max encouragement to use RUE for all functional tasks.        Exercises      General Comments General comments (skin integrity, edema, etc.): Spouse [name] present during session. HR up to 120s bpm and mild SOB noted.      Pertinent Vitals/Pain Pain Assessment Pain Assessment: No/denies pain    Home Living                          Prior Function            PT Goals (current goals can now be found in the care plan section) Progress towards PT goals: Progressing toward goals    Frequency    Min 4X/week      PT Plan Current plan remains appropriate    Co-evaluation              AM-PAC PT "6 Clicks" Mobility   Outcome Measure  Help needed turning from your back to your side while in a flat bed without using bedrails?: A Little Help needed moving from lying on your back to sitting on the side of a  flat bed without using bedrails?: A Lot Help needed moving to and from a bed to a chair (including a wheelchair)?: A Little Help needed standing up from a chair using your arms (e.g., wheelchair or bedside chair)?: A Little Help needed to walk in hospital room?: A Little Help needed climbing 3-5 steps with a railing? : A Lot 6 Click Score: 16    End of Session Equipment Utilized During Treatment: Gait belt Activity Tolerance: Patient tolerated treatment well Patient left: in bed;with call bell/phone within reach;with family/visitor present Nurse Communication: Mobility status PT Visit Diagnosis: Other abnormalities of gait and mobility (R26.89)     Time: 0388-8280 PT Time Calculation (min) (ACUTE ONLY): 32 min  Charges:  $Therapeutic Activity: 8-22 mins $Neuromuscular Re-education: 8-22 mins                     Marisa Severin, PT, DPT Acute Rehabilitation Services Secure chat preferred Office [phone]      Marguarite Arbour A [place] 11/18/2021, 1:50 PM

## 2021-11-18 NOTE — Progress Notes (Addendum)
PROGRESS NOTE    Hayley Bryant  PNT:[id number] DOB: 1970-06-06 DOA: 11/15/2021 PCP: Barrie Lyme, FNP   Chief Complaint  Patient presents with   Aphasia    Brief Narrative:   Hayley Bryant is a 51 y.o. female with medical history significant of anxiety, depression, insomnia, suicidal overdose, vitamin D deficiency, overweight with a BMI of 25.06 kg/m, uncontrolled type II DM, hypertension, Huntington's-like disease who is brought to the emergency department after waking up in the morning with mild confusion, slurred speech and inability to communicate normally.   CT head was negative.  CTA head and neck with no large vessel occlusion, hemodynamically significant stenosis or evidence of dissection.  MRI brain with acute small vessel infarct involving the left basal ganglia and adjacent white matter.  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 -A1c is 7.7 -PT/OT/SLP consulted, recommendation for CIR -She remains on telemetry, there is no evidence of A-fib.  Hypokalemia -Repleted    HTN (hypertension) Allow permissive hypertension.  Continue to hold lisinopril/hydrochlorothiazide, likely will start on lisinopril tomorrow.   Depression Continue fluoxetine 20 mg p.o. daily.   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  Abnormal LFTs Normalized       DVT prophylaxis: Heparin Code Status: Full Family Communication: Discussed with husband  at bedside Disposition: Cleared for CIR, she can be discharged whenever bed is available  Status is: Inpatient    Consultants:  Neurology   Subjective:  No significant events overnight  Objective: Vitals:   11/17/21 1410 11/17/21 1529 11/17/21 2140 11/18/21 0821  BP: (!) 157/80 139/82 (!) 149/90 (!) 149/79  Pulse: 97 70 79 77  Resp: '16 15 18 18  '$ Temp: 98 F (36.7 C) 98.8 F (37.1 C) 98.7 F (37.1 C) 98.5 F (36.9 C)  TempSrc: Oral Oral Oral Oral  SpO2: 99% 100% 100% 100%  Weight:      Height:        Intake/Output Summary (Last 24 hours) at 11/18/2021 1109 Last data filed at 11/18/2021 0720 Gross per 24 hour  Intake 120 ml  Output --  Net 120 ml   Filed Weights   11/15/21 1211 11/16/21 0020  Weight: 72.6 kg 72.7 kg    Examination:  Awake Alert, Oriented X 3, with mild dysarthria Symmetrical Chest wall movement, Good air movement bilaterally, CTAB RRR,No Gallops,Rubs or new Murmurs, No Parasternal Heave +ve B.Sounds, Abd Soft, No tenderness, No rebound - guarding or rigidity. No Cyanosis, Clubbing or edema, No new Rash or bruise       Data Reviewed: I have personally reviewed following labs and imaging  studies  CBC: Recent Labs  Lab 11/15/21 1057 11/16/21 0302 11/17/21 0250  WBC 8.9 7.6 7.0  NEUTROABS 6.6  --   --   HGB 11.3* 10.0* 9.6*  HCT 35.7* 31.0* 29.6*  MCV 86.4 85.6 86.3  PLT 415* 329 876    Basic Metabolic Panel: Recent Labs  Lab 11/15/21 1057 11/16/21 0302 11/17/21 0250  NA 136 138 139  K 4.6 3.1* 3.5  CL 105 105 105  CO2 23  20* 24  GLUCOSE 166* 96 114*  BUN '10 8 11  '$ CREATININE 0.58 0.67 0.65  CALCIUM 9.2 8.6* 8.9    GFR: Estimated Creatinine Clearance: 81.8 mL/min (by C-G formula based on SCr of 0.65 mg/dL).  Liver Function Tests: Recent Labs  Lab 11/15/21 1057 11/16/21 0302  AST 52* 18  ALT 24 13  ALKPHOS 61 50  BILITOT 1.6* 1.0  PROT 8.5* 6.4*  ALBUMIN 4.6 3.4*    CBG: Recent Labs  Lab 11/17/21 0559 11/17/21 1209 11/17/21 1919 11/17/21 2145 11/18/21 0632  GLUCAP 131* 169* 133* 222* 130*     Recent Results (from the past 240 hour(s))  Resp Panel by RT-PCR (Flu A&B, Covid) Anterior Nasal Swab     Status: None   Collection Time: 11/15/21 12:16 PM   Specimen: Anterior Nasal Swab  Result Value Ref Range Status   SARS Coronavirus 2 by RT PCR NEGATIVE NEGATIVE Final    Comment: (NOTE) SARS-CoV-2 target nucleic acids are NOT DETECTED.  The SARS-CoV-2 RNA is generally detectable in upper respiratory specimens during the acute phase of infection. The lowest concentration of SARS-CoV-2 viral copies this assay can detect is 138 copies/mL. A negative result does not preclude SARS-Cov-2 infection and should not be used as the sole basis for treatment or other patient management decisions. A negative result may occur with  improper specimen collection/handling, submission of specimen other than nasopharyngeal swab, presence of viral mutation(s) within the areas targeted by this assay, and inadequate number of viral copies(<138 copies/mL). A negative result must be combined with clinical observations, patient history, and epidemiological information. The expected result is Negative.  Fact Sheet for Patients:  EntrepreneurPulse.com.au  Fact Sheet for Healthcare Providers:  IncredibleEmployment.be  This test is no t yet approved or cleared by the [place] FDA and  has been authorized for detection and/or diagnosis of SARS-CoV-2 by FDA under an  Emergency Use Authorization (EUA). This EUA will remain  in effect (meaning this test can be used) for the duration of the COVID-19 declaration under Section 564(b)(1) of the Act, 21 U.S.C.section 360bbb-3(b)(1), unless the authorization is terminated  or revoked sooner.       Influenza A by PCR NEGATIVE NEGATIVE Final   Influenza B by PCR NEGATIVE NEGATIVE Final    Comment: (NOTE) The Xpert Xpress SARS-CoV-2/FLU/RSV plus assay is intended as an aid in the diagnosis of influenza from Nasopharyngeal swab specimens and should not be used as a sole basis for treatment. Nasal washings and aspirates are unacceptable for Xpert Xpress SARS-CoV-2/FLU/RSV testing.  Fact Sheet for Patients: EntrepreneurPulse.com.au  Fact Sheet for Healthcare Providers: IncredibleEmployment.be  This test is not yet approved or cleared by the [place] FDA and has been authorized for detection and/or diagnosis of SARS-CoV-2 by FDA under an Emergency Use Authorization (EUA). This EUA will remain in effect (meaning this test can be used) for the duration of the COVID-19 declaration under Section 564(b)(1) of the Act, 21 U.S.C. section 360bbb-3(b)(1), unless the authorization is terminated or revoked.  Performed at [facility], [place] [address], [place], [place] [address]          Radiology Studies: ECHOCARDIOGRAM COMPLETE  Result Date: 11/17/2021    ECHOCARDIOGRAM REPORT   Patient Name:   Hayley Bryant Date of Exam: 11/17/2021 Medical Rec #:  [id number]       Height:       67.0 in Accession #:    [id number]      Weight:       160.3 lb Date of Birth:  1970-10-30        BSA:          1.841 m Patient Age:    44 years        BP:           150/83 mmHg Patient Gender: F               HR:           76 bpm. Exam Location:  Inpatient Procedure: 2D Echo, Cardiac Doppler and Color Doppler Indications:    Stroke  History:        Patient has no prior history of  Echocardiogram examinations.                 Risk Factors:Hypertension and Diabetes.  Sonographer:    Joette Catching RCS Referring Phys: [phone] [place]  1. Left ventricular ejection fraction, by estimation, is 60 to 65%. The left ventricle has normal function. The left ventricle has no regional wall motion abnormalities. There is severe concentric left ventricular hypertrophy. Left ventricular diastolic  parameters are consistent with Grade I diastolic dysfunction (impaired relaxation).  2. Right ventricular systolic function is normal. The right ventricular size is normal. There is normal pulmonary artery systolic pressure. The estimated right ventricular systolic pressure is 15.4 mmHg.  3. The mitral valve is normal in structure. Trivial mitral valve regurgitation. No evidence of mitral stenosis.  4. The aortic valve is tricuspid. Aortic valve regurgitation is not visualized. Aortic valve sclerosis/calcification is present, without any evidence of aortic stenosis. Aortic valve area, by VTI measures 3.59 cm. Aortic valve mean gradient measures 4.0 mmHg. Aortic valve Vmax measures 1.31 m/s.  5. The inferior vena cava is normal in size with greater than 50% respiratory variability, suggesting right atrial pressure of 3 mmHg. FINDINGS  Left Ventricle: Left ventricular ejection fraction, by estimation, is 60 to 65%. The left ventricle has normal function. The left ventricle has no regional wall motion abnormalities. The left ventricular internal cavity size was normal in size. There is  severe concentric left ventricular hypertrophy. Left ventricular diastolic parameters are consistent with Grade I diastolic dysfunction (impaired relaxation). Normal left ventricular filling pressure. Right Ventricle: The right ventricular size is normal. No increase in right ventricular wall thickness. Right ventricular systolic function is normal. There is normal pulmonary artery systolic pressure. The  tricuspid regurgitant velocity is 2.10 m/s, and  with an assumed right atrial pressure of 3 mmHg, the estimated right ventricular systolic pressure is 00.8 mmHg. Left Atrium: Left atrial size was normal in size. Right Atrium: Right atrial size was normal in size. Pericardium: There is no evidence of pericardial effusion. Mitral Valve: The mitral valve is normal in structure. Trivial mitral valve regurgitation. No evidence of mitral valve stenosis. Tricuspid Valve: The tricuspid valve is normal in structure. Tricuspid valve regurgitation is trivial. No evidence of tricuspid stenosis. Aortic Valve: The aortic valve is tricuspid. Aortic valve regurgitation is not  visualized. Aortic valve sclerosis/calcification is present, without any evidence of aortic stenosis. Aortic valve mean gradient measures 4.0 mmHg. Aortic valve peak gradient measures 6.9 mmHg. Aortic valve area, by VTI measures 3.59 cm. Pulmonic Valve: The pulmonic valve was normal in structure. Pulmonic valve regurgitation is trivial. No evidence of pulmonic stenosis. Aorta: The aortic root is normal in size and structure. Venous: The inferior vena cava is normal in size with greater than 50% respiratory variability, suggesting right atrial pressure of 3 mmHg. IAS/Shunts: No atrial level shunt detected by color flow Doppler.  LEFT VENTRICLE PLAX 2D LVIDd:         3.40 cm     Diastology LVIDs:         2.20 cm     LV e' medial:    6.53 cm/s LV PW:         1.50 cm     LV E/e' medial:  8.8 LV IVS:        1.60 cm     LV e' lateral:   11.30 cm/s LVOT diam:     2.10 cm     LV E/e' lateral: 5.1 LV SV:         81 LV SV Index:   44 LVOT Area:     3.46 cm  LV Volumes (MOD) LV vol d, MOD A2C: 50.6 ml LV vol d, MOD A4C: 51.0 ml LV vol s, MOD A2C: 18.0 ml LV vol s, MOD A4C: 17.1 ml LV SV MOD A2C:     32.6 ml LV SV MOD A4C:     51.0 ml LV SV MOD BP:      33.3 ml RIGHT VENTRICLE             IVC RV Basal diam:  2.90 cm     IVC diam: 1.90 cm RV Mid diam:    2.20 cm RV S  prime:     14.40 cm/s TAPSE (M-mode): 2.2 cm LEFT ATRIUM             Index        RIGHT ATRIUM           Index LA diam:        3.20 cm 1.74 cm/m   RA Area:     10.30 cm LA Vol (A2C):   50.0 ml 27.16 ml/m  RA Volume:   19.00 ml  10.32 ml/m LA Vol (A4C):   40.8 ml 22.17 ml/m LA Biplane Vol: 44.7 ml 24.29 ml/m  AORTIC VALVE                    PULMONIC VALVE AV Area (Vmax):    3.25 cm     PV Vmax:          1.04 m/s AV Area (Vmean):   3.26 cm     PV Peak grad:     4.3 mmHg AV Area (VTI):     3.59 cm     PR End Diast Vel: 3.46 msec AV Vmax:           131.00 cm/s AV Vmean:          99.700 cm/s AV VTI:            0.226 m AV Peak Grad:      6.9 mmHg AV Mean Grad:      4.0 mmHg LVOT Vmax:         123.00 cm/s LVOT Vmean:  93.800 cm/s LVOT VTI:          0.234 m LVOT/AV VTI ratio: 1.04  AORTA Ao Root diam: 3.00 cm Ao Asc diam:  3.40 cm MITRAL VALVE               TRICUSPID VALVE MV Area (PHT): 5.42 cm    TR Peak grad:   17.6 mmHg MV Decel Time: 140 msec    TR Vmax:        210.00 cm/s MV E velocity: 57.40 cm/s MV A velocity: 93.40 cm/s  SHUNTS MV E/A ratio:  0.61        Systemic VTI:  0.23 m                            Systemic Diam: 2.10 cm Fransico Him MD Electronically signed by Fransico Him MD Signature Date/Time: 11/17/2021/12:41:09 PM    Final    VAS [place] TRANSCRANIAL DOPPLER W BUBBLES  Result Date: 11/16/2021  Transcranial Doppler with Bubble Patient Name:  Hayley Bryant  Date of Exam:   11/16/2021 Medical Rec #: [id number]        Accession #:    [id number] Date of Birth: 1970-10-23         Patient Gender: F Patient Age:   23 years Exam Location[facility]  [facility] Procedure:      VAS [place] TRANSCRANIAL DOPPLER W BUBBLES Referring Phys: Beulah Gandy --------------------------------------------------------------------------------  Indications: Stroke. History: Uncontrolled type II DM, hypertension, confusion, slurred speech and inability to communicate normally. Comparison Study: No prior study on file  Performing Technologist: Sharion Dove RVS  Examination Guidelines: A complete evaluation includes B-mode imaging, spectral Doppler, color Doppler, and power Doppler as needed of all accessible portions of each vessel. Bilateral testing is considered an integral part of a complete examination. Limited examinations for reoccurring indications may be performed as noted.  Summary: No HITS at rest or during Valsalva. Negative transcranial Doppler Bubble study with no evidence of right to left intracardiac communication.  *See table(s) above for TCD measurements and observations.    Preliminary         Scheduled Meds:  aspirin EC  81 mg Oral Daily   atorvastatin  40 mg Oral Daily   clopidogrel  75 mg Oral Daily   FLUoxetine  60 mg Oral Daily   heparin injection (subcutaneous)  5,000 Units Subcutaneous Q8H   insulin aspart  0-5 Units Subcutaneous QHS   insulin aspart  0-9 Units Subcutaneous TID WC   insulin glargine-yfgn  17 Units Subcutaneous Daily   metroNIDAZOLE  500 mg Oral BID   pantoprazole  40 mg Oral Daily   Continuous Infusions:     LOS: 3 days        Phillips Climes, MD [facility]   To contact the attending provider between 7A-7P or the covering provider during after hours 7P-7A, please log into the web site [email or web address] and access using universal [place] password for that web site. If you do not have the password, please call the hospital operator.  11/18/2021, 11:09 AM

## 2021-11-19 DIAGNOSIS — I639 Cerebral infarction, unspecified: Secondary | ICD-10-CM | POA: Diagnosis not present

## 2021-11-19 LAB — GLUCOSE, CAPILLARY
Glucose-Capillary: 142 mg/dL — ABNORMAL HIGH (ref 70–99)
Glucose-Capillary: 185 mg/dL — ABNORMAL HIGH (ref 70–99)
Glucose-Capillary: 149 mg/dL — ABNORMAL HIGH (ref 70–99)
Glucose-Capillary: 141 mg/dL — ABNORMAL HIGH (ref 70–99)

## 2021-11-19 NOTE — Progress Notes (Signed)
Physical Therapy Treatment Patient Details Name: Hayley Bryant MRN: [id number] DOB: July 26, 1970 Today's Date: 11/19/2021   History of Present Illness Pt is a 51 y/o F presenting to ED on 6/16 with slurred speech, CT negative, MRI revealing acute small vessel infarct in L basal ganglia and adjacent white matter. PMH includes Huntington's Disease, HLD, HTN, DM, suicidal overdose, vitamin D deficiency.    PT Comments    Pt eager to participate in therapy. Pt with improved awareness of R side however remains to have inattention over all. Pt assisted to the bathroom and reports "I am so weak." Pts BP 178/111, RN present and asked to return to chair. Pt given R LE and UE exercises to improve strength and co-ordination. Pt remains to have dysarthia with very soft tone. Pt with improved processing and initiation of task asked and remains appropriate for AIR upon d/c as pt was indep PTA. Acute PT to cont to follow.    Recommendations for follow up therapy are one component of a multi-disciplinary discharge planning process, led by the attending physician.  Recommendations may be updated based on patient status, additional functional criteria and insurance authorization.  Follow Up Recommendations  Acute inpatient rehab (3hours/day)     Assistance Recommended at Discharge Frequent or constant Supervision/Assistance  Patient can return home with the following A little help with walking and/or transfers;A little help with bathing/dressing/bathroom;Assistance with cooking/housework;Assistance with feeding;Help with stairs or ramp for entrance;Assist for transportation   Equipment Recommendations  Other (comment) (TBD at next venue)    Recommendations for Other Services Rehab consult     Precautions / Restrictions Precautions Precautions: Fall;Other (comment) Precaution Comments: monitor BP Restrictions Weight Bearing Restrictions: No     Mobility  Bed Mobility Overal bed mobility: Needs  Assistance Bed Mobility: Supine to Sit     Supine to sit: HOB elevated, Min assist     General bed mobility comments: increased time, pt able to take covers off using her R hand with increased time, verbal cues to scoot to EOB to bring both feet on the floor, no physical assist need only verbal cues    Transfers Overall transfer level: Needs assistance Equipment used: None Transfers: Sit to/from Stand Sit to Stand: Min assist           General transfer comment: minA to power up due to fatigue, increased time    Ambulation/Gait Ambulation/Gait assistance: Min assist Gait Distance (Feet): 10 Feet (x2, to/from bathroom) Assistive device: 1 person hand held assist (on R) Gait Pattern/deviations: Step-to pattern, Decreased stride length, Shuffle, Decreased weight shift to right, Decreased stance time - right Gait velocity: decreased Gait velocity interpretation: <1.31 ft/sec, indicative of household ambulator   General Gait Details: pt slow and guarded, pt c/o "i'm so weak, I feel like my legs will give out.", pt with very short step length and very narrow base of support   Stairs             Wheelchair Mobility    Modified Rankin (Stroke Patients Only) Modified Rankin (Stroke Patients Only) Pre-Morbid Rankin Score: No significant disability Modified Rankin: Moderately severe disability     Balance Overall balance assessment: Needs assistance Sitting-balance support: Feet supported, No upper extremity supported Sitting balance-Leahy Scale: Good     Standing balance support: During functional activity Standing balance-Leahy Scale: Fair Standing balance comment: Able to stand at sink and wash hands with close Min gaurd due to instability.  Cognition Arousal/Alertness: Awake/alert Behavior During Therapy: Flat affect Overall Cognitive Status: Impaired/Different from baseline Area of Impairment: Attention, Memory, Following  commands, Safety/judgement, Awareness, Problem solving                   Current Attention Level: Selective Memory: Decreased short-term memory Following Commands: Follows one step commands with increased time, Follows multi-step commands inconsistently Safety/Judgement: Decreased awareness of safety, Decreased awareness of deficits Awareness: Emergent Problem Solving: Slow processing, Requires verbal cues, Requires tactile cues, Difficulty sequencing General Comments: pt initiating tasks asked of her today, delayed response to questions but appropriate. Pt on menstural cycle and was able to change out maxi pad with minimal assist as pt unable to use R UE functionally due to impaired dexterity        Exercises      General Comments General comments (skin integrity, edema, etc.): pt assisted to bathroom and changed maxi pad in underwear wiht minA, BP 1858/97 upon PT arrival, 178/111 s/p trip to the bathroom, RN came to room to address      Pertinent Vitals/Pain Pain Assessment Pain Assessment: No/denies pain    Home Living                          Prior Function            PT Goals (current goals can now be found in the care plan section) Acute Rehab PT Goals PT Goal Formulation: With patient/family Time For Goal Achievement: 11/30/21 Potential to Achieve Goals: Good Progress towards PT goals: Progressing toward goals    Frequency    Min 4X/week      PT Plan Current plan remains appropriate    Co-evaluation              AM-PAC PT "6 Clicks" Mobility   Outcome Measure  Help needed turning from your back to your side while in a flat bed without using bedrails?: A Little Help needed moving from lying on your back to sitting on the side of a flat bed without using bedrails?: A Little Help needed moving to and from a bed to a chair (including a wheelchair)?: A Little Help needed standing up from a chair using your arms (e.g., wheelchair or  bedside chair)?: A Little Help needed to walk in hospital room?: A Little Help needed climbing 3-5 steps with a railing? : A Lot 6 Click Score: 17    End of Session Equipment Utilized During Treatment: Gait belt Activity Tolerance: Patient tolerated treatment well Patient left: with call bell/phone within reach;with family/visitor present;in chair;with chair alarm set;with nursing/sitter in room Nurse Communication: Mobility status PT Visit Diagnosis: Other abnormalities of gait and mobility (R26.89)     Time: 8242-3536 PT Time Calculation (min) (ACUTE ONLY): 25 min  Charges:  $Gait Training: 8-22 mins $Therapeutic Activity: 8-22 mins                     Kittie Plater, PT, DPT Acute Rehabilitation Services Secure chat preferred Office #: [phone]    Berline Lopes 11/19/2021, 12:59 PM

## 2021-11-19 NOTE — Progress Notes (Signed)
Inpatient Rehabilitation Admissions Coordinator   I await insurance approval for [facility] admit. I met with patient and her spouse at bedside and they are aware.  Danne Baxter, RN, MSN Rehab Admissions Coordinator [phone] 11/19/2021 2:08 PM

## 2021-11-19 NOTE — Plan of Care (Signed)

## 2021-11-19 NOTE — Progress Notes (Signed)
PROGRESS NOTE    Hayley Bryant  POE:[id number] DOB: [birth year] DOA: 11/15/2021 PCP: Barrie Lyme, FNP   Chief Complaint  Patient presents with   Aphasia    Brief Narrative:   Hayley Bryant is a 51 y.o. female with medical history significant of anxiety, depression, insomnia, suicidal overdose, vitamin D deficiency, overweight with a BMI of 25.06 kg/m, uncontrolled type II DM, hypertension, Huntington's-like disease who is brought to the emergency department after waking up in the morning with mild confusion, slurred speech and inability to communicate normally.   CT head was negative.  CTA head and neck with no large vessel occlusion, hemodynamically significant stenosis or evidence of dissection.  MRI brain with acute small vessel infarct involving the left basal ganglia and adjacent white matter.  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 -A1c is 7.7 -PT/OT/SLP consulted, recommendation for CIR -She remains on telemetry, there is no evidence of A-fib.  Hypokalemia -Repleted    HTN (hypertension) Allow permissive hypertension.  Continue to hold lisinopril/hydrochlorothiazide, will consider restarting meds and 24 to 48 hours    Depression Continue fluoxetine 20 mg p.o. daily.   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  Abnormal LFTs Normalized       DVT prophylaxis: Heparin Code Status: Full Family Communication: Discussed with husband  at bedside Disposition: Cleared for CIR, she can be discharged whenever bed is available  Status is: Inpatient    Consultants:  Neurology   Subjective:  No significant events overnight  Objective: Vitals:   11/19/21 0130 11/19/21 0530 11/19/21 0730 11/19/21 1139  BP: (!) 146/81 (!) 152/85 (!) 158/89   Pulse: 67 (!) 54    Resp: 18 18 (!) 23   Temp: 98 F (36.7 C)  98 F (36.7 C) 98.3 F (36.8 C)  TempSrc: Oral  Oral Oral  SpO2: 100% 100%    Weight:      Height:       No intake or output data in the 24 hours ending 11/19/21 1347  Filed Weights   11/15/21 1211 11/16/21 0020  Weight: 72.6 kg 72.7 kg    Examination:  Awake Alert, Oriented X 3, with mild dysarthria Symmetrical Chest wall movement, Good air movement bilaterally, CTAB RRR,No Gallops,Rubs or new Murmurs, No Parasternal Heave +ve B.Sounds, Abd Soft, No tenderness, No rebound - guarding or rigidity. No Cyanosis, Clubbing or edema, No new Rash or bruise        Data Reviewed: I have personally reviewed following labs and imaging studies  CBC: Recent Labs  Lab 11/15/21 1057 11/16/21 0302 11/17/21 0250  WBC  8.9 7.6 7.0  NEUTROABS 6.6  --   --   HGB 11.3* 10.0* 9.6*  HCT 35.7* 31.0* 29.6*  MCV 86.4 85.6 86.3  PLT 415* 329 734    Basic Metabolic Panel: Recent Labs  Lab 11/15/21 1057 11/16/21 0302 11/17/21 0250  NA 136 138 139  K 4.6 3.1* 3.5  CL 105 105 105  CO2 23 20* 24  GLUCOSE 166* 96 114*  BUN '10 8 11  '$ CREATININE 0.58 0.67 0.65  CALCIUM 9.2 8.6* 8.9    GFR: Estimated  Creatinine Clearance: 81.8 mL/min (by C-G formula based on SCr of 0.65 mg/dL).  Liver Function Tests: Recent Labs  Lab 11/15/21 1057 11/16/21 0302  AST 52* 18  ALT 24 13  ALKPHOS 61 50  BILITOT 1.6* 1.0  PROT 8.5* 6.4*  ALBUMIN 4.6 3.4*    CBG: Recent Labs  Lab 11/18/21 1154 11/18/21 1638 11/18/21 2138 11/19/21 0620 11/19/21 1138  GLUCAP 172* 182* 177* 142* 185*     Recent Results (from the past 240 hour(s))  Resp Panel by RT-PCR (Flu A&B, Covid) Anterior Nasal Swab     Status: None   Collection Time: 11/15/21 12:16 PM   Specimen: Anterior Nasal Swab  Result Value Ref Range Status   SARS Coronavirus 2 by RT PCR NEGATIVE NEGATIVE Final    Comment: (NOTE) SARS-CoV-2 target nucleic acids are NOT DETECTED.  The SARS-CoV-2 RNA is generally detectable in upper respiratory specimens during the acute phase of infection. The lowest concentration of SARS-CoV-2 viral copies this assay can detect is 138 copies/mL. A negative result does not preclude SARS-Cov-2 infection and should not be used as the sole basis for treatment or other patient management decisions. A negative result may occur with  improper specimen collection/handling, submission of specimen other than nasopharyngeal swab, presence of viral mutation(s) within the areas targeted by this assay, and inadequate number of viral copies(<138 copies/mL). A negative result must be combined with clinical observations, patient history, and epidemiological information. The expected result is Negative.  Fact Sheet for Patients:  EntrepreneurPulse.com.au  Fact Sheet for Healthcare Providers:  IncredibleEmployment.be  This test is no t yet approved or cleared by the [place] FDA and  has been authorized for detection and/or diagnosis of SARS-CoV-2 by FDA under an Emergency Use Authorization (EUA). This EUA will remain  in effect (meaning this test can be used) for the duration of  the COVID-19 declaration under Section 564(b)(1) of the Act, 21 U.S.C.section 360bbb-3(b)(1), unless the authorization is terminated  or revoked sooner.       Influenza A by PCR NEGATIVE NEGATIVE Final   Influenza B by PCR NEGATIVE NEGATIVE Final    Comment: (NOTE) The Xpert Xpress SARS-CoV-2/FLU/RSV plus assay is intended as an aid in the diagnosis of influenza from Nasopharyngeal swab specimens and should not be used as a sole basis for treatment. Nasal washings and aspirates are unacceptable for Xpert Xpress SARS-CoV-2/FLU/RSV testing.  Fact Sheet for Patients: EntrepreneurPulse.com.au  Fact Sheet for Healthcare Providers: IncredibleEmployment.be  This test is not yet approved or cleared by the [place] FDA and has been authorized for detection and/or diagnosis of SARS-CoV-2 by FDA under an Emergency Use Authorization (EUA). This EUA will remain in effect (meaning this test can be used) for the duration of the COVID-19 declaration under Section 564(b)(1) of the Act, 21 U.S.C. section 360bbb-3(b)(1), unless the authorization is terminated or revoked.  Performed at [facility], [place] [address] [place], [place] [address]  Radiology Studies: No results found.      Scheduled Meds:  aspirin EC  81 mg Oral Daily   atorvastatin  40 mg Oral Daily   clopidogrel  75 mg Oral Daily   FLUoxetine  60 mg Oral Daily   heparin injection (subcutaneous)  5,000 Units Subcutaneous Q8H   insulin aspart  0-5 Units Subcutaneous QHS   insulin aspart  0-9 Units Subcutaneous TID WC   insulin glargine-yfgn  17 Units Subcutaneous Daily   pantoprazole  40 mg Oral Daily   Continuous Infusions:     LOS: 4 days        Phillips Climes, MD [facility]   To contact the attending provider between 7A-7P or the covering provider during after hours 7P-7A, please log into the web site [email or web address] and access  using universal [place] password for that web site. If you do not have the password, please call the hospital operator.  11/19/2021, 1:47 PM

## 2021-11-19 NOTE — Consult Note (Signed)
Nutrition Education Note  RD consulted for nutrition education regarding heart healthy diet and DM.  Pt resting in bed at the time of assessment with visitor at bedside. Pt states she normally has 2 meals each day and a snack. Works at night, so meal times are altered. Pt mainly consumes fast food. For breakfast with get a bacon, egg, and cheese biscuit with a soda or will get a bacon, egg, and cheese croissant sandwich with hashbrowns. For her other meal typically gets [employer] or [employer].   RD provided "Heart-Healthy Consistent Carbohydrate Nutrition Therapy" handout from the Academy of Nutrition and Dietetics. Discussed different food groups and their effects on blood sugar, emphasizing carbohydrate-containing foods. Provided list of carbohydrates and recommended serving sizes of common foods. Discussed importance of controlled and consistent carbohydrate intake throughout the day. Provided examples of ways to balance meals/snacks and encouraged intake of high-fiber, whole grain complex carbohydrates.  Discussed reducing sodium and eating more fruits, vegetables, and whole grains. Also recommended eating out less and switching to diet sodas.   Teach back method used.  Expect fair compliance.  Body mass index is 25.1kg/m. Pt meets criteria for overweight based on current BMI.  Current diet order is heart healthy, patient is consuming approximately 88% of meals at this time. Labs and medications reviewed. No further nutrition interventions warranted at this time. RD contact information provided. If additional nutrition issues arise, please re-consult RD.   Ranell Patrick, RD, LDN Clinical Dietitian RD pager # available in [place]  After hours/weekend pager # available in [facility]

## 2021-11-20 DIAGNOSIS — I639 Cerebral infarction, unspecified: Secondary | ICD-10-CM | POA: Diagnosis not present

## 2021-11-20 LAB — GLUCOSE, CAPILLARY
Glucose-Capillary: 134 mg/dL — ABNORMAL HIGH (ref 70–99)
Glucose-Capillary: 263 mg/dL — ABNORMAL HIGH (ref 70–99)
Glucose-Capillary: 197 mg/dL — ABNORMAL HIGH (ref 70–99)
Glucose-Capillary: 236 mg/dL — ABNORMAL HIGH (ref 70–99)

## 2021-11-20 LAB — PROTEIN C ACTIVITY: Protein C Activity: 92 % (ref 73–180)

## 2021-11-20 LAB — LUPUS ANTICOAGULANT PANEL
DRVVT: 31.8 s (ref 0.0–47.0)
PTT Lupus Anticoagulant: 31.5 s (ref 0.0–43.5)

## 2021-11-20 LAB — PROTEIN S ACTIVITY: Protein S Activity: 84 % (ref 63–140)

## 2021-11-20 LAB — PROTEIN S, TOTAL: Protein S Ag, Total: 117 % (ref 60–150)

## 2021-11-20 MED ORDER — LABETALOL HCL 5 MG/ML IV SOLN
10.0000 mg | INTRAVENOUS | Status: DC | PRN
  Administered 2021-11-20: 10 mg via INTRAVENOUS
  Filled 2021-11-20: qty 4

## 2021-11-20 MED ORDER — HYDROCHLOROTHIAZIDE 25 MG PO TABS
25.0000 mg | ORAL_TABLET | Freq: Every day | ORAL | Status: DC
  Administered 2021-11-20 – 2021-11-22 (×3): 25 mg via ORAL
  Filled 2021-11-20 (×3): qty 1

## 2021-11-20 MED ORDER — LISINOPRIL-HYDROCHLOROTHIAZIDE 20-25 MG PO TABS: 1.0000 | ORAL_TABLET | Freq: Every day | ORAL | Status: DC

## 2021-11-20 MED ORDER — HYDRALAZINE HCL 25 MG PO TABS: 25.0000 mg | ORAL_TABLET | ORAL | Status: DC | PRN

## 2021-11-20 MED ORDER — LISINOPRIL 20 MG PO TABS
20.0000 mg | ORAL_TABLET | Freq: Every day | ORAL | Status: DC
  Administered 2021-11-20 – 2021-11-22 (×3): 20 mg via ORAL
  Filled 2021-11-20 (×3): qty 1

## 2021-11-20 NOTE — Progress Notes (Signed)
Physical Therapy Treatment Patient Details Name: Hayley Bryant MRN: [id number] DOB: 06/07/1970 Today's Date: 11/20/2021   History of Present Illness Pt is a 51 y/o F presenting to ED on 6/16 with slurred speech, CT negative, MRI revealing acute small vessel infarct in L basal ganglia and adjacent white matter. PMH includes Huntington's Disease, HLD, HTN, DM, suicidal overdose, vitamin D deficiency.    PT Comments    Pt progressing towards all goals. Pt continues with increased BP of 172/86, HR did increase to 120s and RR to 32 during ambulation with report of SOB however once returned to sitting pts HR to 100bpm and RR at 20, suspect due to anxiety. Pt remains to have dysarthria, soft muffled speech, R UE and LE weakness with R UE fine motor and dexterity deficits. Pt to greatly benefit from AIR upon d/c for maximal functional recovery as pt was indep PTA. Pt with good home set up and support. Acute PT to cont to follow.    Recommendations for follow up therapy are one component of a multi-disciplinary discharge planning process, led by the attending physician.  Recommendations may be updated based on patient status, additional functional criteria and insurance authorization.  Follow Up Recommendations  Acute inpatient rehab (3hours/day)     Assistance Recommended at Discharge Frequent or constant Supervision/Assistance  Patient can return home with the following A little help with walking and/or transfers;A little help with bathing/dressing/bathroom;Assistance with cooking/housework;Assistance with feeding;Help with stairs or ramp for entrance;Assist for transportation   Equipment Recommendations  Other (comment) (TBD at next venue)    Recommendations for Other Services Rehab consult     Precautions / Restrictions Precautions Precautions: Fall;Other (comment) Precaution Comments: monitor BP Restrictions Weight Bearing Restrictions: Yes     Mobility  Bed Mobility Overal bed  mobility: Needs Assistance Bed Mobility: Supine to Sit     Supine to sit: HOB elevated, Min guard     General bed mobility comments: increased time, pt able to take covers off using her R hand with increased time, verbal cues to scoot to EOB to bring both feet on the floor, no physical assist need only verbal cues    Transfers Overall transfer level: Needs assistance Equipment used: None Transfers: Sit to/from Stand Sit to Stand: Min assist           General transfer comment: minA to power up, increased time, inc weightbearing on L LE, tactile cues to keep even Wbing between LEs    Ambulation/Gait Ambulation/Gait assistance: Min assist Gait Distance (Feet): 50 Feet Assistive device: 1 person hand held assist (on L to simulate cane however pt minimally used L hand to stabilize during R LE advancement) Gait Pattern/deviations: Step-to pattern, Decreased stride length, Shuffle, Decreased weight shift to right, Decreased stance time - right Gait velocity: decreased Gait velocity interpretation: <1.31 ft/sec, indicative of household ambulator   General Gait Details: pt slow and guarded, max verbal cues to increased step length and width of base of support however unable to maintain. Pt with inc RR to 32 and HR in 120s, pt reports "I'm short of breath" once returned to sitting RR down to 20, HR to 100bpm, suspect anxiety related   Stairs             Wheelchair Mobility    Modified Rankin (Stroke Patients Only) Modified Rankin (Stroke Patients Only) Pre-Morbid Rankin Score: No significant disability Modified Rankin: Moderately severe disability     Balance Overall balance assessment: Needs assistance Sitting-balance support: Feet  supported, No upper extremity supported Sitting balance-Leahy Scale: Good     Standing balance support: During functional activity Standing balance-Leahy Scale: Fair Standing balance comment: pt stood at tolieted in squat to try to perform  pericare and change maxi pad without LOB for approx 90 sec                            Cognition Arousal/Alertness: Awake/alert Behavior During Therapy: Flat affect Overall Cognitive Status: Impaired/Different from baseline Area of Impairment: Attention, Memory, Following commands, Safety/judgement, Awareness, Problem solving                   Current Attention Level: Selective Memory: Decreased short-term memory Following Commands: Follows one step commands with increased time, Follows multi-step commands inconsistently Safety/Judgement: Decreased awareness of safety, Decreased awareness of deficits (R sided inattention, couldn't find the toliet paper on R side when using restroom) Awareness: Emergent Problem Solving: Slow processing, Requires verbal cues, Requires tactile cues, Difficulty sequencing General Comments: pt continues to be soft spoken, with R inattention and delayed processing, pt is initiating using R UE more to assist with functional tasks although difficult        Exercises      General Comments General comments (skin integrity, edema, etc.): RR 32 during amb, returned to 20 upon sitting, HR 120s during amb, returned to 100 bpm when sitting, pt requiring minA for pericare and change of maxi pad due to decreased function/dexterity of R UE      Pertinent Vitals/Pain Pain Assessment Pain Assessment: No/denies pain    Home Living                          Prior Function            PT Goals (current goals can now be found in the care plan section) Acute Rehab PT Goals Patient Stated Goal: return to PLOF PT Goal Formulation: With patient/family Time For Goal Achievement: 11/30/21 Potential to Achieve Goals: Good Progress towards PT goals: Progressing toward goals    Frequency    Min 4X/week      PT Plan Current plan remains appropriate    Co-evaluation              AM-PAC PT "6 Clicks" Mobility   Outcome Measure   Help needed turning from your back to your side while in a flat bed without using bedrails?: A Little Help needed moving from lying on your back to sitting on the side of a flat bed without using bedrails?: A Little Help needed moving to and from a bed to a chair (including a wheelchair)?: A Little Help needed standing up from a chair using your arms (e.g., wheelchair or bedside chair)?: A Little Help needed to walk in hospital room?: A Little Help needed climbing 3-5 steps with a railing? : A Lot 6 Click Score: 17    End of Session Equipment Utilized During Treatment: Gait belt Activity Tolerance: Patient limited by fatigue (and increased RR) Patient left: with call bell/phone within reach;with family/visitor present;in chair;with chair alarm set Nurse Communication: Mobility status (pt RR up to 32 and HR at 120sbpm during amb) PT Visit Diagnosis: Other abnormalities of gait and mobility (R26.89)     Time: 5277-8242 PT Time Calculation (min) (ACUTE ONLY): 28 min  Charges:  $Gait Training: 8-22 mins $Therapeutic Activity: 8-22 mins  Kittie Plater, PT, DPT Acute Rehabilitation Services Secure chat preferred Office #: [phone]    Berline Lopes 11/20/2021, 12:17 PM

## 2021-11-20 NOTE — Progress Notes (Signed)
Inpatient Rehabilitation Admissions Coordinator   I await insurance approval and bed availability to admit to CIR.  Danne Baxter, RN, MSN Rehab Admissions Coordinator [phone] 11/20/2021 11:08 AM

## 2021-11-20 NOTE — Progress Notes (Signed)
TRH night cross cover note:   I was notified by RN of the patient's current blood pressure of 191/84 with heart rate 67.  Per my chart review, including review of most recent rounding hospitalist progress note, this is a 51 year old female who is admitted with acute ischemic CVA.  Per most recent progress note, plan includes continued observance of permissive hypertension for an additional 24 hours, while holding home lisinopril and HCTZ. I have also confirmed that there is an existing order for prn IV hydralazine for systolic blood pressure greater than 968 or diastolic blood pressure greater than 120, consistent with this plan for observance of permissive hypertension.    Babs Bertin, DO Hospitalist

## 2021-11-20 NOTE — Hospital Course (Signed)
Hayley Bryant is a 51 yo female with PMH anxiety/depression, DMII, HTN, fibroids, who presented with right-sided weakness and difficulty with speech.  MRI brain showed acute infarct involving left basal ganglia and adjacent white matter.

## 2021-11-20 NOTE — Progress Notes (Signed)
Progress Note    Hayley Bryant   FXT:[id number]  DOB: 1971/05/23  DOA: 11/15/2021     5 PCP: Barrie Lyme, FNP  Initial CC: right side weakness, difficulty with speech  Hospital Course: Hayley Bryant is a 51 yo female with PMH anxiety/depression, DMII, HTN, fibroids, who presented with right-sided weakness and difficulty with speech.  MRI brain showed acute infarct involving left basal ganglia and adjacent white matter.  Interval History:  Seen this morning in her room with husband present bedside.  Still having ongoing right-sided weakness but some improvement.  Speech has also improved some since onset.  Assessment and Plan:  Acute CVA (cerebrovascular accident) ([place]) -MRI Brain significant for acute CVA involving left basal ganglia -CTA head and neck with no significant LVO - 2 D echo with preserved EF, and no evidence of intra-atrial shunt -Transcranial Doppler with bubble with no evidence of a shunt -Neurology input greatly appreciated, continue with aspirin and Plavix for 3 weeks then aspirin alone -LDL 78. -A1c is 7.7 -PT/OT/SLP consulted, recommendation for CIR -She remains on telemetry, there is no evidence of A-fib.   Hypokalemia -Repleted    HTN (hypertension) -Resume lisinopril-HCTZ - Use labetalol or hydralazine as needed as well   Depression Continue fluoxetine 20 mg p.o. daily.   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   Abnormal LFTs Normalized   Old records reviewed in assessment of this patient  Antimicrobials:   DVT prophylaxis:  heparin injection 5,000 Units Start: 11/16/21 1400 SCDs Start: 11/15/21 1608   Code Status:   Code Status: Full  Code  Disposition Plan:  pending CIR eval Status is: Inpt  Objective: Blood pressure (!) 172/87, pulse 90, temperature 99.1 F (37.3 C), temperature source Oral, resp. rate 20, height '5\' 7"'$  (1.702 m), weight 72.7 kg, SpO2 100 %.  Examination:  Physical Exam Constitutional:      Appearance: Normal appearance.  HENT:     Head: Normocephalic and atraumatic.     Mouth/Throat:     Mouth: Mucous membranes are moist.  Eyes:     Extraocular Movements: Extraocular movements intact.  Cardiovascular:     Rate and Rhythm: Normal rate and regular rhythm.  Pulmonary:     Effort: Pulmonary effort is normal.     Breath sounds: Normal breath sounds.  Abdominal:     General: Bowel sounds are normal. There is no distension.     Palpations: Abdomen is soft.     Tenderness: There is no abdominal tenderness.  Musculoskeletal:        General: Normal range of motion.     Cervical back: Normal range of motion and neck supple.  Skin:    General: Skin is warm and dry.  Neurological:     Mental Status: She is alert.     Comments: 4/5 RUE/RLE strength; no paresthesias. Mild dysarthria   Psychiatric:        Mood and Affect: Mood normal.      Consultants:  Neurology  Procedures:    Data Reviewed: Results for orders placed or performed during the hospital encounter of 11/15/21 (from the past 24 hour(s))  Glucose, capillary     Status: Abnormal   Collection Time: 11/19/21  4:36 PM  Result Value Ref Range   Glucose-Capillary 149 (H) 70 - 99 mg/dL   Comment 1 Notify RN    Comment 2 Document in Chart   Glucose, capillary     Status: Abnormal   Collection Time: 11/19/21  9:20 PM  Result Value Ref Range   Glucose-Capillary 141 (H) 70 - 99 mg/dL   Comment 1 Notify RN    Comment 2 Document in Chart   Glucose, capillary     Status: Abnormal   Collection Time: 11/20/21  6:09 AM  Result Value Ref Range   Glucose-Capillary 134 (H) 70 - 99 mg/dL   Comment 1 Notify RN    Comment 2 Document in  Chart   Glucose, capillary     Status: Abnormal   Collection Time: 11/20/21 12:31 PM  Result Value Ref Range   Glucose-Capillary 263 (H) 70 - 99 mg/dL    I have Reviewed nursing notes, Vitals, and Lab results since pt's last encounter. Pertinent lab results : see above I have ordered test including BMP, CBC, Mg I have reviewed the last note from staff over past 24 hours I have discussed pt's care plan and test results with nursing staff, case manager   LOS: 5 days   Dwyane Dee, MD [facility] 11/20/2021, 2:15 PM

## 2021-11-20 NOTE — Progress Notes (Signed)
Speech Language Pathology Treatment: Cognitive-Linquistic  Patient Details Name: Hayley Bryant MRN: [id number] DOB: Feb 28, 1971 Today's Date: 11/20/2021 Time: 3646-8032 SLP Time Calculation (min) (ACUTE ONLY): 15 min  Assessment / Plan / Recommendation Clinical Impression  Pt seen for treatment of aphasia and dysarthria with husband present in room. Pt with persisting mild dysarthria c/b imprecise articulation and low vocal intensity. Intelligibility at the word- sentence level at baseline was 75%. Given min-mod verbal/demonstration cues for over articulation and increase of vocal intensity, intelligibility improved to 100% during structured sentence level task. She demonstrated improvement, since initial eval, during responsive naming task. She had x2 instances of word finding difficulty across 10 trials. In x1 of those instances, pt able to recall described item given extra processing time. In the other instance, she benefited from cues to further describe a given object (circumlocution strategy) in order to recall. Pt would continue to benefit from SLP services acutely. Will f/u.    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 PMH includes Huntington's Disease, HLD, HTN, DM, suicidal overdose, vitamin D deficiency      SLP Plan  Continue with current plan of care      Recommendations for follow up therapy are one component of a multi-disciplinary discharge planning process, led by the attending physician.  Recommendations may be updated based on patient status, additional functional criteria and insurance authorization.    Recommendations                   SLP Visit Diagnosis: Aphasia (R47.01);Dysarthria and anarthria (R47.1) Plan: Continue with current plan of care            Hayley Bryant, [place], [place] Office  Number: [phone]  Hayley Bryant  11/20/2021, 2:07 PM

## 2021-11-20 NOTE — Progress Notes (Signed)
Occupational Therapy Treatment Patient Details Name: Hayley Bryant MRN: [id number] DOB: [birth year] Today's Date: 11/20/2021   History of present illness Pt is a 51 y/o F presenting to ED on 6/16 with slurred speech, CT negative, MRI revealing acute small vessel infarct in L basal ganglia and adjacent white matter. PMH includes Huntington's Disease, HLD, HTN, DM, suicidal overdose, vitamin D deficiency.   OT comments  Pt progressing towards goals, focus on RUE strengthening and functional activity this session. Pt able to engage in functional reach task seated EOB, at times needing assist to support elbow/facilitate grasp of objects. Pt often reaching/grasping objects with 1st-3rd digits, emphasis on cylindrical and spherical grasp to involve entire hand. Pt/family educated on use of towel to facilitate shoulder movement on tabletop and provided with tan (softest level) theraputty with written HEP, pt able to complete with demonstration. Pt min guard A for toileting and standing grooming task. Pt with continued R inattention, delayed visual tracking and difficulty sustaining gaze to L side when assessed. Pt tolerating almost an hour of therapy this session, remains motivated and would continue to benefit from AIR at d/c to address functional limitations listed below.   Recommendations for follow up therapy are one component of a multi-disciplinary discharge planning process, led by the attending physician.  Recommendations may be updated based on patient status, additional functional criteria and insurance authorization.    Follow Up Recommendations  Acute inpatient rehab (3hours/day)    Assistance Recommended at Discharge Intermittent Supervision/Assistance  Patient can return home with the following  A little help with walking and/or transfers;A lot of help with bathing/dressing/bathroom;Assistance with cooking/housework;Direct supervision/assist for medications management;Direct  supervision/assist for financial management;Assist for transportation;Help with stairs or ramp for entrance   Equipment Recommendations  BSC/3in1    Recommendations for Other Services PT consult;Rehab consult    Precautions / Restrictions Precautions Precautions: Fall;Other (comment) Precaution Comments: monitor BP Restrictions Weight Bearing Restrictions: Yes       Mobility Bed Mobility Overal bed mobility: Needs Assistance Bed Mobility: Supine to Sit     Supine to sit: HOB elevated, Min guard Sit to supine: Min guard, HOB elevated   General bed mobility comments: increased time, pt able to take covers off using her R hand with increased time, verbal cues to scoot to EOB to bring both feet on the floor, no physical assist need only verbal cues    Transfers Overall transfer level: Needs assistance Equipment used: None Transfers: Sit to/from Stand Sit to Stand: Min assist                 Balance Overall balance assessment: Needs assistance Sitting-balance support: Feet supported, No upper extremity supported Sitting balance-Leahy Scale: Good     Standing balance support: During functional activity Standing balance-Leahy Scale: Fair                             ADL either performed or assessed with clinical judgement   ADL Overall ADL's : Needs assistance/impaired     Grooming: Standing;Cueing for sequencing;Cueing for safety;Min guard                   Toilet Transfer: Min guard;Ambulation;Regular Glass blower/designer Details (indicate cue type and reason): simulated in room Toileting- Clothing Manipulation and Hygiene: Min guard;Sitting/lateral lean;Sit to/from stand Toileting - Clothing Manipulation Details (indicate cue type and reason): for pericare     Functional mobility during ADLs: Cueing for safety;Min  guard      Extremity/Trunk Assessment Upper Extremity Assessment RUE Deficits / Details: 3-/5 ROM/strength RUE  Coordination: decreased fine motor;decreased gross motor LUE Deficits / Details: 3+/5 ROM/strength LUE Coordination: decreased fine motor;decreased gross motor   Lower Extremity Assessment Lower Extremity Assessment: Defer to PT evaluation        Vision   Vision Assessment?: Vision impaired- to be further tested in functional context Eye Alignment: Within Functional Limits Ocular Range of Motion: Impaired-to be further tested in functional context Tracking/Visual Pursuits: Decreased smoothness of horizontal tracking;Unable to hold eye position out of midline Additional Comments: cannot track fully to L side and/or hold on L side out of midline   Perception Perception Perception: Not tested   Praxis Praxis Praxis: Not tested    Cognition Arousal/Alertness: Awake/alert Behavior During Therapy: Flat affect Overall Cognitive Status: Impaired/Different from baseline Area of Impairment: Attention, Memory, Following commands, Safety/judgement, Awareness, Problem solving                   Current Attention Level: Selective Memory: Decreased short-term memory Following Commands: Follows one step commands with increased time, Follows multi-step commands inconsistently Safety/Judgement: Decreased awareness of safety, Decreased awareness of deficits Awareness: Emergent Problem Solving: Slow processing, Requires verbal cues, Requires tactile cues, Difficulty sequencing          Exercises Exercises: General Upper Extremity, Hand exercises, Hand activities, Other exercises General Exercises - Upper Extremity Shoulder Flexion: AAROM, Right, 10 reps, Seated Shoulder Horizontal ABduction: AAROM, Right, 10 reps, Seated Digit Composite Flexion: AAROM, Right, 10 reps, Seated Composite Extension: AROM, Right, 10 reps, Seated Hand Exercises Digit Lifts: AROM, Right, 10 reps, Seated Digit Lifts Limitations: unable to lift 3rd & 4th digits off of table agianst gravity Other  Exercises Other Exercises: theraputty R composite digit extension Other Exercises: theraputty R composite digit flexion Other Exercises: R functional reach/grasp/release task x10 AAROM    Shoulder Instructions       General Comments VSS on RA    Pertinent Vitals/ Pain       Pain Assessment Pain Assessment: No/denies pain  Home Living                                          Prior Functioning/Environment              Frequency  Min 2X/week        Progress Toward Goals  OT Goals(current goals can now be found in the care plan section)  Progress towards OT goals: Progressing toward goals  Acute Rehab OT Goals Patient Stated Goal: none stated OT Goal Formulation: With patient Time For Goal Achievement: 11/30/21 Potential to Achieve Goals: Good ADL Goals Pt Will Perform Upper Body Dressing: with min guard assist;sitting Pt Will Perform Lower Body Dressing: with min assist;sit to/from stand;sitting/lateral leans Pt Will Transfer to Toilet: with supervision;ambulating;regular height toilet Additional ADL Goal #1: pt will accurately gather 3 items needed for task in prep for ADLs Additional ADL Goal #2: Pt will complete 4 step trailmaking task in prep for ADLs  Plan Frequency remains appropriate;Discharge plan remains appropriate    Co-evaluation                 AM-PAC OT "6 Clicks" Daily Activity     Outcome Measure   Help from another person eating meals?: A Little Help from another person taking care of  personal grooming?: A Little Help from another person toileting, which includes using toliet, bedpan, or urinal?: A Lot Help from another person bathing (including washing, rinsing, drying)?: A Lot Help from another person to put on and taking off regular upper body clothing?: A Lot Help from another person to put on and taking off regular lower body clothing?: A Little 6 Click Score: 15    End of Session Equipment Utilized During  Treatment: Gait belt  OT Visit Diagnosis: Unsteadiness on feet (R26.81);Other abnormalities of gait and mobility (R26.89);Muscle weakness (generalized) (M62.81);Other symptoms and signs involving cognitive function;Cognitive communication deficit (R41.841)   Activity Tolerance Patient tolerated treatment well   Patient Left in bed;with call bell/phone within reach;with bed alarm set;with family/visitor present   Nurse Communication Mobility status        Time: 6389-3734 OT Time Calculation (min): 51 min  Charges: OT General Charges $OT Visit: 1 Visit OT Treatments $Self Care/Home Management : 8-22 mins $Therapeutic Activity: 23-37 mins  Lynnda Child, OTD, OTR/L Acute Rehab [phone]   Kaylyn Lim 11/20/2021, 4:42 PM

## 2021-11-20 NOTE — Plan of Care (Signed)
  Problem: Education: Goal: Knowledge of General Education information will improve Description: Including pain rating scale, medication(s)/side effects and non-pharmacologic comfort measures Outcome: Progressing   Problem: Health Behavior/Discharge Planning: Goal: Ability to identify and utilize available resources and services will improve Outcome: Progressing

## 2021-11-21 DIAGNOSIS — I639 Cerebral infarction, unspecified: Secondary | ICD-10-CM | POA: Diagnosis not present

## 2021-11-21 LAB — GLUCOSE, CAPILLARY
Glucose-Capillary: 157 mg/dL — ABNORMAL HIGH (ref 70–99)
Glucose-Capillary: 247 mg/dL — ABNORMAL HIGH (ref 70–99)
Glucose-Capillary: 150 mg/dL — ABNORMAL HIGH (ref 70–99)
Glucose-Capillary: 217 mg/dL — ABNORMAL HIGH (ref 70–99)

## 2021-11-21 LAB — FACTOR 5 LEIDEN

## 2021-11-21 NOTE — Progress Notes (Signed)
Progress Note    Hayley Bryant   BTD:[id number]  DOB: Feb 15, 1971  DOA: 11/15/2021     6 PCP: Barrie Lyme, FNP  Initial CC: right side weakness, difficulty with speech  Hospital Course: Hayley Bryant is a 51 yo female with PMH anxiety/depression, DMII, HTN, fibroids, who presented with right-sided weakness and difficulty with speech.  MRI brain showed acute infarct involving left basal ganglia and adjacent white matter.  Interval History:  No events overnight. Resting comfortably in bed. Deficits about the same. She understands plan is CIR once a bed is ready   Assessment and Plan:  Acute CVA (cerebrovascular accident) ([place]) -MRI Brain significant for acute CVA involving left basal ganglia -CTA head and neck with no significant LVO - 2 D echo with preserved EF, and no evidence of intra-atrial shunt -Transcranial Doppler with bubble with no evidence of a shunt -Neurology input greatly appreciated, continue with aspirin and Plavix for 3 weeks then aspirin alone -LDL 78. -A1c is 7.7 -PT/OT/SLP consulted, recommendation for CIR   Hypokalemia -Repleted    HTN (hypertension) -Resume lisinopril-HCTZ - Use labetalol or hydralazine as needed as well   Depression Continue fluoxetine 20 mg p.o. daily.   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[place]) -Continue with PT   Hyperlipidemia associated with type 2 diabetes mellitus ([place]) LDL of 78, increase atorvastatin from 20 to 40 mg   Uncontrolled type 2 diabetes mellitus with hyperglycemia ([place]) Continue with Semglee, will add insulin sliding scale, hold Trulicity and Jardiance A1C is 7.7.   Abnormal LFTs Normalized   Old records reviewed in assessment of this patient  Antimicrobials:   DVT prophylaxis:  heparin injection 5,000 Units Start: 11/16/21 1400 SCDs Start: 11/15/21 1608   Code Status:   Code Status: Full Code  Disposition Plan:  CIR  Status is: Inpt  Objective: Blood pressure 112/83, pulse 64, temperature 98.8 F  (37.1 C), temperature source Oral, resp. rate 20, height '5\' 7"'$  (1.702 m), weight 72.7 kg, SpO2 99 %.  Examination:  Physical Exam Constitutional:      Appearance: Normal appearance.  HENT:     Head: Normocephalic and atraumatic.     Mouth/Throat:     Mouth: Mucous membranes are moist.  Eyes:     Extraocular Movements: Extraocular movements intact.  Cardiovascular:     Rate and Rhythm: Normal rate and regular rhythm.  Pulmonary:     Effort: Pulmonary effort is normal.     Breath sounds: Normal breath sounds.  Abdominal:     General: Bowel sounds are normal. There is no distension.     Palpations: Abdomen is soft.     Tenderness: There is no abdominal tenderness.  Musculoskeletal:        General: Normal range of motion.     Cervical back: Normal range of motion and neck supple.  Skin:    General: Skin is warm and dry.  Neurological:     Mental Status: She is alert.     Comments: 4/5 RUE/RLE strength; no paresthesias. Mild dysarthria   Psychiatric:        Mood and Affect: Mood normal.      Consultants:  Neurology  Procedures:    Data Reviewed: Results for orders placed or performed during the hospital encounter of 11/15/21 (from the past 24 hour(s))  Glucose, capillary     Status: Abnormal   Collection Time: 11/20/21  3:22 PM  Result Value Ref Range   Glucose-Capillary 197 (H) 70 - 99 mg/dL  Glucose, capillary  Status: Abnormal   Collection Time: 11/20/21  9:46 PM  Result Value Ref Range   Glucose-Capillary 236 (H) 70 - 99 mg/dL   Comment 1 Notify RN    Comment 2 Document in Chart   Glucose, capillary     Status: Abnormal   Collection Time: 11/21/21  6:09 AM  Result Value Ref Range   Glucose-Capillary 157 (H) 70 - 99 mg/dL   Comment 1 Notify RN    Comment 2 Document in Chart   Glucose, capillary     Status: Abnormal   Collection Time: 11/21/21 11:17 AM  Result Value Ref Range   Glucose-Capillary 247 (H) 70 - 99 mg/dL   Comment 1 Notify RN    Comment 2  Document in Chart     I have Reviewed nursing notes, Vitals, and Lab results since pt's last encounter. Pertinent lab results : see above I have reviewed the last note from staff over past 24 hours I have discussed pt's care plan and test results with nursing staff, case manager   LOS: 6 days   Dwyane Dee, MD [facility] 11/21/2021, 2:33 PM

## 2021-11-21 NOTE — Progress Notes (Signed)
Inpatient Rehabilitation Admissions Coordinator   I have insurance approval for CIR but no bed is available today.Patient and spouse made aware. I am hopeful for bed in next 24 to 48 hrs.  Danne Baxter, RN, MSN Rehab Admissions Coordinator [phone] 11/21/2021 11:33 AM

## 2021-11-21 NOTE — Progress Notes (Signed)
Speech Language Pathology Treatment: Cognitive-Linquistic  Patient Details Name: Hayley Bryant MRN: [id number] DOB: 08/30/1970 Today's Date: 11/21/2021 Time: 6384-6659 SLP Time Calculation (min) (ACUTE ONLY): 14 min  Assessment / Plan / Recommendation Clinical Impression  Pt seen for f/u treatment of motor speech and carryover in use of provided strategies from previous session. When asked x10 open ended question regarding pt's occupation, family, hobbies, etc., pt provided a sentence level response demonstrating 50% intelligibility. Provided min intermittent verbal cues for over articulation, increase of vocal intensity and slow rate of speech,  intelligibility improved to 80% at the sentence level. This same level of intelligibility was also noted when reading out loud from a menu using speech strategies. She is largely unaware of her errors, but demonstrated few occassions of self-correction this date. SLP to continue f/u acutely.    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 PMH includes Huntington's Disease, HLD, HTN, DM, suicidal overdose, vitamin D deficiency      SLP Plan  Continue with current plan of care      Recommendations for follow up therapy are one component of a multi-disciplinary discharge planning process, led by the attending physician.  Recommendations may be updated based on patient status, additional functional criteria and insurance authorization.    Recommendations                   SLP Visit Diagnosis: Dysarthria and anarthria (R47.1) Plan: Continue with current plan of care            Ellwood Dense, [place], [place] Office Number: [phone]  Acie Fredrickson  11/21/2021, 12:53 PM

## 2021-11-22 ENCOUNTER — Other Ambulatory Visit: Payer: Self-pay

## 2021-11-22 ENCOUNTER — Encounter (HOSPITAL_COMMUNITY): Payer: Self-pay | Admitting: Physical Medicine & Rehabilitation

## 2021-11-22 ENCOUNTER — Inpatient Hospital Stay (HOSPITAL_COMMUNITY)
Admission: RE | Admit: 2021-11-22 | Discharge: 2021-12-06 | DRG: 057 | Disposition: A | Discharge status: Home Health | Payer: Managed Care, Other (non HMO) | Source: Intra-hospital | Attending: Physical Medicine & Rehabilitation | Admitting: Physical Medicine & Rehabilitation

## 2021-11-22 DIAGNOSIS — L259 Unspecified contact dermatitis, unspecified cause: Secondary | ICD-10-CM | POA: Diagnosis not present

## 2021-11-22 DIAGNOSIS — I69319 Unspecified symptoms and signs involving cognitive functions following cerebral infarction: Secondary | ICD-10-CM

## 2021-11-22 DIAGNOSIS — R531 Weakness: Secondary | ICD-10-CM

## 2021-11-22 DIAGNOSIS — E785 Hyperlipidemia, unspecified: Secondary | ICD-10-CM | POA: Diagnosis present

## 2021-11-22 DIAGNOSIS — I69322 Dysarthria following cerebral infarction: Secondary | ICD-10-CM

## 2021-11-22 DIAGNOSIS — E8809 Other disorders of plasma-protein metabolism, not elsewhere classified: Secondary | ICD-10-CM | POA: Diagnosis not present

## 2021-11-22 DIAGNOSIS — Z8249 Family history of ischemic heart disease and other diseases of the circulatory system: Secondary | ICD-10-CM | POA: Diagnosis not present

## 2021-11-22 DIAGNOSIS — I10 Essential (primary) hypertension: Secondary | ICD-10-CM | POA: Diagnosis present

## 2021-11-22 DIAGNOSIS — I69320 Aphasia following cerebral infarction: Secondary | ICD-10-CM

## 2021-11-22 DIAGNOSIS — I69398 Other sequelae of cerebral infarction: Secondary | ICD-10-CM

## 2021-11-22 DIAGNOSIS — I69351 Hemiplegia and hemiparesis following cerebral infarction affecting right dominant side: Principal | ICD-10-CM

## 2021-11-22 DIAGNOSIS — R21 Rash and other nonspecific skin eruption: Secondary | ICD-10-CM | POA: Diagnosis not present

## 2021-11-22 DIAGNOSIS — I639 Cerebral infarction, unspecified: Secondary | ICD-10-CM | POA: Diagnosis not present

## 2021-11-22 DIAGNOSIS — F32A Depression, unspecified: Secondary | ICD-10-CM | POA: Diagnosis present

## 2021-11-22 DIAGNOSIS — D649 Anemia, unspecified: Secondary | ICD-10-CM | POA: Diagnosis present

## 2021-11-22 DIAGNOSIS — Z888 Allergy status to other drugs, medicaments and biological substances status: Secondary | ICD-10-CM | POA: Diagnosis not present

## 2021-11-22 DIAGNOSIS — Z832 Family history of diseases of the blood and blood-forming organs and certain disorders involving the immune mechanism: Secondary | ICD-10-CM | POA: Diagnosis not present

## 2021-11-22 DIAGNOSIS — Z79899 Other long term (current) drug therapy: Secondary | ICD-10-CM | POA: Diagnosis not present

## 2021-11-22 DIAGNOSIS — E119 Type 2 diabetes mellitus without complications: Secondary | ICD-10-CM | POA: Diagnosis present

## 2021-11-22 DIAGNOSIS — E1169 Type 2 diabetes mellitus with other specified complication: Secondary | ICD-10-CM | POA: Diagnosis not present

## 2021-11-22 DIAGNOSIS — Z7984 Long term (current) use of oral hypoglycemic drugs: Secondary | ICD-10-CM | POA: Diagnosis not present

## 2021-11-22 DIAGNOSIS — Z794 Long term (current) use of insulin: Secondary | ICD-10-CM | POA: Diagnosis not present

## 2021-11-22 DIAGNOSIS — R2689 Other abnormalities of gait and mobility: Secondary | ICD-10-CM | POA: Diagnosis present

## 2021-11-22 DIAGNOSIS — R519 Headache, unspecified: Secondary | ICD-10-CM | POA: Diagnosis not present

## 2021-11-22 LAB — PROTHROMBIN GENE MUTATION

## 2021-11-22 LAB — CBC
HCT: 29.6 % — ABNORMAL LOW (ref 36.0–46.0)
Hemoglobin: 9.4 g/dL — ABNORMAL LOW (ref 12.0–15.0)
MCH: 27.3 pg (ref 26.0–34.0)
MCHC: 31.8 g/dL (ref 30.0–36.0)
MCV: 86 fL (ref 80.0–100.0)
Platelets: 363 10*3/uL (ref 150–400)
RBC: 3.44 MIL/uL — ABNORMAL LOW (ref 3.87–5.11)
RDW: 15.8 % — ABNORMAL HIGH (ref 11.5–15.5)
WBC: 6.8 10*3/uL (ref 4.0–10.5)
nRBC: 0 % (ref 0.0–0.2)

## 2021-11-22 LAB — GLUCOSE, CAPILLARY
Glucose-Capillary: 159 mg/dL — ABNORMAL HIGH (ref 70–99)
Glucose-Capillary: 187 mg/dL — ABNORMAL HIGH (ref 70–99)
Glucose-Capillary: 180 mg/dL — ABNORMAL HIGH (ref 70–99)
Glucose-Capillary: 167 mg/dL — ABNORMAL HIGH (ref 70–99)

## 2021-11-22 MED ORDER — TRAZODONE HCL 50 MG PO TABS
50.0000 mg | ORAL_TABLET | Freq: Every evening | ORAL | Status: DC | PRN
Start: 2021-11-22 — End: 2021-11-22
  Administered 2021-11-22: 50 mg via ORAL
  Filled 2021-11-22: qty 1

## 2021-11-22 MED ORDER — INSULIN ASPART 100 UNIT/ML IJ SOLN
0.0000 [IU] | Freq: Every day | INTRAMUSCULAR | Status: DC
  Administered 2021-11-23: 2 [IU] via SUBCUTANEOUS

## 2021-11-22 MED ORDER — BISACODYL 5 MG PO TBEC: 5.0000 mg | DELAYED_RELEASE_TABLET | Freq: Every day | ORAL | Status: DC | PRN

## 2021-11-22 MED ORDER — PROCHLORPERAZINE MALEATE 5 MG PO TABS: 5.0000 mg | ORAL_TABLET | Freq: Four times a day (QID) | ORAL | Status: DC | PRN

## 2021-11-22 MED ORDER — TRAZODONE HCL 50 MG PO TABS
25.0000 mg | ORAL_TABLET | Freq: Every evening | ORAL | Status: DC | PRN
  Administered 2021-11-22 – 2021-12-05 (×13): 50 mg via ORAL
  Filled 2021-11-22 (×13): qty 1

## 2021-11-22 MED ORDER — MAGNESIUM HYDROXIDE 400 MG/5ML PO SUSP
30.0000 mL | Freq: Every day | ORAL | Status: DC | PRN
Start: 2021-11-22 — End: 2021-12-06

## 2021-11-22 MED ORDER — ACETAMINOPHEN 325 MG PO TABS
325.0000 mg | ORAL_TABLET | ORAL | Status: DC | PRN
  Administered 2021-11-23 – 2021-12-02 (×6): 650 mg via ORAL
  Filled 2021-11-22 (×6): qty 2

## 2021-11-22 MED ORDER — INSULIN GLARGINE-YFGN 100 UNIT/ML ~~LOC~~ SOLN
17.0000 [IU] | Freq: Every day | SUBCUTANEOUS | Status: DC
  Administered 2021-11-23 – 2021-12-01 (×9): 17 [IU] via SUBCUTANEOUS
  Filled 2021-11-22 (×10): qty 0.17

## 2021-11-22 MED ORDER — PROCHLORPERAZINE 25 MG RE SUPP: 12.5000 mg | Freq: Four times a day (QID) | RECTAL | Status: DC | PRN

## 2021-11-22 MED ORDER — MELATONIN 5 MG PO TABS
10.0000 mg | ORAL_TABLET | Freq: Every evening | ORAL | Status: DC | PRN
  Administered 2021-11-27 – 2021-12-05 (×3): 10 mg via ORAL
  Filled 2021-11-22 (×3): qty 2

## 2021-11-22 MED ORDER — INSULIN ASPART 100 UNIT/ML IJ SOLN
0.0000 [IU] | Freq: Three times a day (TID) | INTRAMUSCULAR | Status: DC
  Administered 2021-11-22: 2 [IU] via SUBCUTANEOUS
  Administered 2021-11-23: 3 [IU] via SUBCUTANEOUS
  Administered 2021-11-23 (×2): 2 [IU] via SUBCUTANEOUS
  Administered 2021-11-24: 1 [IU] via SUBCUTANEOUS
  Administered 2021-11-24: 2 [IU] via SUBCUTANEOUS
  Administered 2021-11-24: 3 [IU] via SUBCUTANEOUS
  Administered 2021-11-25: 1 [IU] via SUBCUTANEOUS
  Administered 2021-11-25 – 2021-11-26 (×3): 2 [IU] via SUBCUTANEOUS
  Administered 2021-11-26 (×2): 1 [IU] via SUBCUTANEOUS
  Administered 2021-11-27: 2 [IU] via SUBCUTANEOUS
  Administered 2021-11-28: 1 [IU] via SUBCUTANEOUS
  Administered 2021-12-01: 2 [IU] via SUBCUTANEOUS
  Administered 2021-12-02: 1 [IU] via SUBCUTANEOUS
  Administered 2021-12-03 – 2021-12-05 (×4): 2 [IU] via SUBCUTANEOUS

## 2021-11-22 MED ORDER — GUAIFENESIN-DM 100-10 MG/5ML PO SYRP: 5.0000 mL | ORAL_SOLUTION | Freq: Four times a day (QID) | ORAL | Status: DC | PRN

## 2021-11-22 MED ORDER — LISINOPRIL 20 MG PO TABS
20.0000 mg | ORAL_TABLET | Freq: Every day | ORAL | Status: DC
  Administered 2021-11-23 – 2021-12-06 (×14): 20 mg via ORAL
  Filled 2021-11-22 (×16): qty 1

## 2021-11-22 MED ORDER — ATORVASTATIN CALCIUM 40 MG PO TABS
40.0000 mg | ORAL_TABLET | Freq: Every day | ORAL | Status: DC
  Administered 2021-11-23 – 2021-12-06 (×14): 40 mg via ORAL
  Filled 2021-11-22 (×14): qty 1

## 2021-11-22 MED ORDER — PANTOPRAZOLE SODIUM 40 MG PO TBEC
40.0000 mg | DELAYED_RELEASE_TABLET | Freq: Every day | ORAL | Status: DC
  Administered 2021-11-23 – 2021-12-06 (×14): 40 mg via ORAL
  Filled 2021-11-22 (×14): qty 1

## 2021-11-22 MED ORDER — ASPIRIN 81 MG PO TBEC
81.0000 mg | DELAYED_RELEASE_TABLET | Freq: Every day | ORAL | Status: DC
  Administered 2021-11-23 – 2021-12-06 (×14): 81 mg via ORAL
  Filled 2021-11-22 (×14): qty 1

## 2021-11-22 MED ORDER — FLEET ENEMA 7-19 GM/118ML RE ENEM
1.0000 | ENEMA | Freq: Once | RECTAL | Status: DC | PRN
Start: 2021-11-22 — End: 2021-12-06

## 2021-11-22 MED ORDER — ALUM & MAG HYDROXIDE-SIMETH 200-200-20 MG/5ML PO SUSP: 30.0000 mL | ORAL | Status: DC | PRN

## 2021-11-22 MED ORDER — CLOPIDOGREL BISULFATE 75 MG PO TABS
75.0000 mg | ORAL_TABLET | Freq: Every day | ORAL | Status: DC
Start: 2021-11-23 — End: 2021-12-05
  Administered 2021-11-23 – 2021-12-04 (×12): 75 mg via ORAL
  Filled 2021-11-22 (×13): qty 1

## 2021-11-22 MED ORDER — METHOCARBAMOL 500 MG PO TABS
500.0000 mg | ORAL_TABLET | Freq: Four times a day (QID) | ORAL | Status: DC | PRN
Start: 2021-11-22 — End: 2021-12-06
  Administered 2021-11-23 – 2021-11-27 (×6): 500 mg via ORAL
  Filled 2021-11-22 (×6): qty 1

## 2021-11-22 MED ORDER — DIPHENHYDRAMINE HCL 12.5 MG/5ML PO ELIX
12.5000 mg | ORAL_SOLUTION | Freq: Four times a day (QID) | ORAL | Status: DC | PRN
  Administered 2021-11-28 – 2021-12-04 (×8): 25 mg via ORAL
  Filled 2021-11-22 (×8): qty 10

## 2021-11-22 MED ORDER — HYDROCHLOROTHIAZIDE 25 MG PO TABS
25.0000 mg | ORAL_TABLET | Freq: Every day | ORAL | Status: DC
  Administered 2021-11-23 – 2021-12-06 (×14): 25 mg via ORAL
  Filled 2021-11-22 (×14): qty 1

## 2021-11-22 MED ORDER — HEPARIN SODIUM (PORCINE) 5000 UNIT/ML IJ SOLN
5000.0000 [IU] | Freq: Three times a day (TID) | INTRAMUSCULAR | Status: DC
  Administered 2021-11-22 – 2021-12-06 (×41): 5000 [IU] via SUBCUTANEOUS
  Filled 2021-11-22 (×41): qty 1

## 2021-11-22 MED ORDER — FLUOXETINE HCL 20 MG PO CAPS
60.0000 mg | ORAL_CAPSULE | Freq: Every day | ORAL | Status: DC
  Administered 2021-11-23 – 2021-12-06 (×14): 60 mg via ORAL
  Filled 2021-11-22 (×14): qty 3

## 2021-11-22 MED ORDER — HEPARIN SODIUM (PORCINE) 5000 UNIT/ML IJ SOLN: 5000.0000 [IU] | Freq: Three times a day (TID) | INTRAMUSCULAR | Status: DC

## 2021-11-22 MED ORDER — PROCHLORPERAZINE EDISYLATE 10 MG/2ML IJ SOLN: 5.0000 mg | Freq: Four times a day (QID) | INTRAMUSCULAR | Status: DC | PRN

## 2021-11-22 NOTE — H&P (Addendum)
Physical Medicine and Rehabilitation Admission H&P    CC: Functional deficits secondary to acute infarct involving left basal ganglia and adjacent white matter  HPI: Hayley Bryant is a right-handed 51 year old female who presented to [facility] ED on 11/15/2021 with slurred speech and noted confusion at home. Code stroke activated and neurology, Dr Iver Nestle consulted. Out of window for tenecteplase. CTH and CTA head performed without acute findings or LVO. Aspirin and Plavix therapy started on admission. Transferred to [facility]. Noted hypophonic voice and mild dysarthria. 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  Mild chronic microvascular ischemic changes. Permissive hypertension allowed. Transcranial doppler without PFO. 2D echo EF 60-65%. Hypercoagulable work-up negative. To continue aspirin and Plavix for 3 weeks then aspirin alone. Tolerating heart healthy diet. The patient requires inpatient physical medicine and rehabilitation evaluations and treatment secondary to dysfunction due to left basal ganglia infarct, history of Huntington' chorea.   Other history significant for DM-2, depression, hypertension  Review of Systems  Constitutional: Negative.   HENT: Negative.    Eyes: Negative.   Respiratory: Negative.    Cardiovascular: Negative.   Skin: Negative.   Neurological:  Positive for weakness.  Psychiatric/Behavioral: Negative.     Past Medical History:  Diagnosis Date   Anxiety    Depression    Diabetes mellitus    Fibroids    Hypertension    Past Surgical History:  Procedure Laterality Date   CHOLECYSTECTOMY     LYMPH NODE DISSECTION     Family History  Problem Relation Age of Onset   Hypertension Mother    Heart attack Mother    Huntington's disease Mother    Huntington's disease Maternal Grandmother    Social History:  reports that she has never smoked. She has never used smokeless tobacco. She reports that she  does not drink alcohol and does not use drugs. Allergies:  Allergies  Allergen Reactions   Metformin And Related Other (See Comments)    Pt. States it makes her heart flutter.    Medications Prior to Admission  Medication Sig Dispense Refill   acetaminophen (TYLENOL) 500 MG tablet Take 1,000 mg by mouth every 6 (six) hours as needed.     atorvastatin (LIPITOR) 20 MG tablet Take 20 mg by mouth daily.     blood glucose meter kit and supplies KIT Dispense based on patient and insurance preference. Use up to four times daily as directed. 1 each 0   Dulaglutide (TRULICITY) 3 MG/0.5ML SOPN Inject 3 mg as directed once a week. (Patient not taking: Reported on 11/15/2021) 3 mL 0   empagliflozin (JARDIANCE) 10 MG TABS tablet Take 1 tablet (10 mg total) by mouth daily. (Patient not taking: Reported on 11/15/2021) 30 tablet 0   FLUoxetine (PROZAC) 20 MG capsule Take 60 mg by mouth daily.     ibuprofen (ADVIL) 200 MG tablet Take 800 mg by mouth every 6 (six) hours as needed for headache or mild pain.     Insulin Glargine (BASAGLAR KWIKPEN) 100 UNIT/ML Inject 17 Units into the skin daily.     insulin glargine, 1 Unit Dial, (TOUJEO SOLOSTAR) 300 UNIT/ML Solostar Pen Inject 17 Units into the skin at bedtime. (Patient not taking: Reported on 11/15/2021) 1.5 mL 0   lisinopril-hydrochlorothiazide (ZESTORETIC) 20-25 MG tablet Take 1 tablet by mouth daily. (Patient not taking: Reported on 11/15/2021) 30 tablet 0   Melatonin 10 MG TABS Take 10 mg by mouth daily.  methocarbamol (ROBAXIN) 500 MG tablet Take 1 tablet (500 mg total) by mouth 2 (two) times daily. (Patient taking differently: Take 500 mg by mouth 2 (two) times daily as needed for muscle spasms.) 20 tablet 0   metroNIDAZOLE (FLAGYL) 500 MG tablet Take 500 mg by mouth 2 (two) times daily.     naphazoline-pheniramine (NAPHCON-A) 0.025-0.3 % ophthalmic solution Place 1 drop into both eyes every 4 (four) hours as needed for irritation. (Patient not taking:  Reported on 12/22/2016) 15 mL 0     Home: Home Living Family/patient expects to be discharged to:: Private residence Living Arrangements: Spouse/significant other (2 adult  sons live with them and a daughter lives outside the home) Available Help at Discharge:  (spouse self employed, will have to work to arrange coverage) Type of Home: House Home Access: Level entry Home Layout: Two level, 1/2 bath on main level Alternate Level Stairs-Number of Steps: 13 Alternate Level Stairs-Rails: None Bathroom Shower/Tub: Associate Professor: Yes Home Equipment: None  Lives With: Spouse, Family   Functional History: Prior Function Prior Level of Function : Independent/Modified Independent, Other (comment) (not driving)   Functional Status:  Mobility: Bed Mobility Overal bed mobility: Needs Assistance Bed Mobility: Supine to Sit Supine to sit: HOB elevated, Min guard Sit to supine: Min guard, HOB elevated General bed mobility comments: increased time, pt able to take covers off using her R hand with increased time, verbal cues to scoot to EOB to bring both feet on the floor, no physical assist need only verbal cues Transfers Overall transfer level: Needs assistance Equipment used: None Transfers: Sit to/from Stand Sit to Stand: Min assist General transfer comment: minA to power up, increased time, inc weightbearing on L LE, tactile cues to keep even Wbing between LEs Ambulation/Gait Ambulation/Gait assistance: Min Chemical engineer (Feet): 50 Feet Assistive device: 1 person hand held assist (on L to simulate cane however pt minimally used L hand to stabilize during R LE advancement) Gait Pattern/deviations: Step-to pattern, Decreased stride length, Shuffle, Decreased weight shift to right, Decreased stance time - right General Gait Details: pt slow and guarded, max verbal cues to increased step length and width of base of support however  unable to maintain. Pt with inc RR to 32 and HR in 120s, pt reports "I'm short of breath" once returned to sitting RR down to 20, HR to 100bpm, suspect anxiety related Gait velocity: decreased Gait velocity interpretation: <1.31 ft/sec, indicative of household ambulator   ADL: ADL Overall ADL's : Needs assistance/impaired Eating/Feeding: Minimal assistance, Sitting Grooming: Standing, Cueing for sequencing, Cueing for safety, Min guard Grooming Details (indicate cue type and reason): with built up handle, cues for sequencing Upper Body Bathing: Moderate assistance, Sitting Lower Body Bathing: Moderate assistance, Sitting/lateral leans Upper Body Dressing : Moderate assistance, Sitting Lower Body Dressing: Moderate assistance, Sitting/lateral leans Toilet Transfer: Min guard, Ambulation, Regular Toilet Toilet Transfer Details (indicate cue type and reason): simulated in room Toileting- Clothing Manipulation and Hygiene: Min guard, Sitting/lateral lean, Sit to/from stand Toileting - Clothing Manipulation Details (indicate cue type and reason): for pericare Functional mobility during ADLs: Cueing for safety, Min guard   Cognition: Cognition Overall Cognitive Status: Impaired/Different from baseline Arousal/Alertness: Awake/alert Orientation Level: Oriented X4 Attention: Selective Selective Attention: Impaired Cognition Arousal/Alertness: Awake/alert Behavior During Therapy: Flat affect Overall Cognitive Status: Impaired/Different from baseline Area of Impairment: Attention, Memory, Following commands, Safety/judgement, Awareness, Problem solving Current Attention Level: Selective Memory: Decreased short-term memory Following Commands: Follows one  step commands with increased time, Follows multi-step commands inconsistently Safety/Judgement: Decreased awareness of safety, Decreased awareness of deficits Awareness: Emergent Problem Solving: Slow processing, Requires verbal cues,  Requires tactile cues, Difficulty sequencing General Comments: pt continues to be soft spoken, with R inattention and delayed processing, pt is initiating using R UE more to assist with functional tasks although difficult    Physical Exam: There were no vitals taken for this visit. Physical Exam Gen: no distress, normal appearing, husband at bedside HEENT: oral mucosa pink and moist, NCAT Cardio: Reg rate Chest: normal effort, normal rate of breathing Abd: soft, non-distended Ext: no edema Psych: pleasant, normal affect Skin: intact Neuro: Alert, dysarthria, strength 4/5 left side, 3/5 right side. Sensation intact Results for orders placed or performed during the hospital encounter of 11/15/21 (from the past 48 hour(s))  Glucose, capillary     Status: Abnormal   Collection Time: 11/20/21  3:22 PM  Result Value Ref Range   Glucose-Capillary 197 (H) 70 - 99 mg/dL    Comment: Glucose reference range applies only to samples taken after fasting for at least 8 hours.  Glucose, capillary     Status: Abnormal   Collection Time: 11/20/21  9:46 PM  Result Value Ref Range   Glucose-Capillary 236 (H) 70 - 99 mg/dL    Comment: Glucose reference range applies only to samples taken after fasting for at least 8 hours.   Comment 1 Notify RN    Comment 2 Document in Chart   Glucose, capillary     Status: Abnormal   Collection Time: 11/21/21  6:09 AM  Result Value Ref Range   Glucose-Capillary 157 (H) 70 - 99 mg/dL    Comment: Glucose reference range applies only to samples taken after fasting for at least 8 hours.   Comment 1 Notify RN    Comment 2 Document in Chart   Glucose, capillary     Status: Abnormal   Collection Time: 11/21/21 11:17 AM  Result Value Ref Range   Glucose-Capillary 247 (H) 70 - 99 mg/dL    Comment: Glucose reference range applies only to samples taken after fasting for at least 8 hours.   Comment 1 Notify RN    Comment 2 Document in Chart   Glucose, capillary      Status: Abnormal   Collection Time: 11/21/21  4:26 PM  Result Value Ref Range   Glucose-Capillary 150 (H) 70 - 99 mg/dL    Comment: Glucose reference range applies only to samples taken after fasting for at least 8 hours.   Comment 1 Notify RN    Comment 2 Document in Chart   Glucose, capillary     Status: Abnormal   Collection Time: 11/21/21  9:22 PM  Result Value Ref Range   Glucose-Capillary 217 (H) 70 - 99 mg/dL    Comment: Glucose reference range applies only to samples taken after fasting for at least 8 hours.   Comment 1 Notify RN    Comment 2 Document in Chart   Glucose, capillary     Status: Abnormal   Collection Time: 11/22/21  6:42 AM  Result Value Ref Range   Glucose-Capillary 159 (H) 70 - 99 mg/dL    Comment: Glucose reference range applies only to samples taken after fasting for at least 8 hours.   Comment 1 Notify RN    Comment 2 Document in Chart   Glucose, capillary     Status: Abnormal   Collection Time: 11/22/21 12:18 PM  Result  Value Ref Range   Glucose-Capillary 187 (H) 70 - 99 mg/dL    Comment: Glucose reference range applies only to samples taken after fasting for at least 8 hours.   No results found.    There were no vitals taken for this visit.  Medical Problem List and Plan: 1. Functional deficits secondary to left basal ganglia infarct  -patient may shower  -ELOS/Goals: 7-9 days S  -Admit to CIR 2.  Antithrombotics: -DVT/anticoagulation:  Pharmaceutical: Heparin  -antiplatelet therapy: Aspirin 81 mg daily and Plavix 75 mg daily for 3 weeks then aspirin alone 3. Pain Management: Tylenol 4. Mood/Sleep: LCSW to evaluate and provide emotional support  -antipsychotic agents: n/a 5. Neuropsych/cognition: This patient is not capable of making decisions on her own behalf. 6. Skin/Wound Care: Routine skin care checks 7. Fluids/Electrolytes/Nutrition: Routine I's and O's and follow-up chemistries 8. Hypertension: Continue lisinopril/HCTZ 9. Depression  continue fluoxetine 20 mg daily 10. Diabetes mellitus-2: A1c 7.7; monitor CBGs  -- SSI before every meal and nightly  -- Continue Semglee 17 units daily 11. Hyperlipidemia: Continue atorvastatin 40 mg daily 12.Anemia: Repeat hgb tomorrow.   I have personally performed a face to face diagnostic evaluation, including, but not limited to relevant history and physical exam findings, of this patient and developed relevant assessment and plan.  Additionally, I have reviewed and concur with the physician assistant's documentation above.  Sula Soda, MD, ABPMR   Wendi Maya, PA

## 2021-11-23 LAB — CBC WITH DIFFERENTIAL/PLATELET
Abs Immature Granulocytes: 0.01 10*3/uL (ref 0.00–0.07)
Basophils Absolute: 0 10*3/uL (ref 0.0–0.1)
Basophils Relative: 0 %
Eosinophils Absolute: 0 10*3/uL (ref 0.0–0.5)
Eosinophils Relative: 0 %
HCT: 29.9 % — ABNORMAL LOW (ref 36.0–46.0)
Hemoglobin: 9.6 g/dL — ABNORMAL LOW (ref 12.0–15.0)
Immature Granulocytes: 0 %
Lymphocytes Relative: 19 %
Lymphs Abs: 1.3 10*3/uL (ref 0.7–4.0)
MCH: 27.7 pg (ref 26.0–34.0)
MCHC: 32.1 g/dL (ref 30.0–36.0)
MCV: 86.2 fL (ref 80.0–100.0)
Monocytes Absolute: 0.5 10*3/uL (ref 0.1–1.0)
Monocytes Relative: 7 %
Neutro Abs: 5 10*3/uL (ref 1.7–7.7)
Neutrophils Relative %: 74 %
Platelets: 341 10*3/uL (ref 150–400)
RBC: 3.47 MIL/uL — ABNORMAL LOW (ref 3.87–5.11)
RDW: 15.5 % (ref 11.5–15.5)
WBC: 6.9 10*3/uL (ref 4.0–10.5)
nRBC: 0 % (ref 0.0–0.2)

## 2021-11-23 LAB — COMPREHENSIVE METABOLIC PANEL
ALT: 30 U/L (ref 0–44)
AST: 21 U/L (ref 15–41)
Albumin: 3.4 g/dL — ABNORMAL LOW (ref 3.5–5.0)
Alkaline Phosphatase: 42 U/L (ref 38–126)
Anion gap: 8 (ref 5–15)
BUN: 19 mg/dL (ref 6–20)
CO2: 22 mmol/L (ref 22–32)
Calcium: 9 mg/dL (ref 8.9–10.3)
Chloride: 104 mmol/L (ref 98–111)
Creatinine, Ser: 0.76 mg/dL (ref 0.44–1.00)
GFR, Estimated: >60 mL/min (ref >60)
Glucose, Bld: 188 mg/dL — ABNORMAL HIGH (ref 70–99)
Potassium: 3.5 mmol/L (ref 3.5–5.1)
Sodium: 134 mmol/L — ABNORMAL LOW (ref 135–145)
Total Bilirubin: 0.5 mg/dL (ref 0.3–1.2)
Total Protein: 6.4 g/dL — ABNORMAL LOW (ref 6.5–8.1)

## 2021-11-23 LAB — GLUCOSE, CAPILLARY
Glucose-Capillary: 172 mg/dL — ABNORMAL HIGH (ref 70–99)
Glucose-Capillary: 236 mg/dL — ABNORMAL HIGH (ref 70–99)
Glucose-Capillary: 180 mg/dL — ABNORMAL HIGH (ref 70–99)
Glucose-Capillary: 230 mg/dL — ABNORMAL HIGH (ref 70–99)

## 2021-11-23 NOTE — Progress Notes (Signed)
PROGRESS NOTE   Subjective/Complaints: Patient seen in bed today with cc of rt knee pain this morning, also said that she didn't sleep well last night.  ROS: Negative for fever, chills, CP, SOB, N/V/C/D. + weakness and myalgia  Objective:   No results found. Recent Labs    11/22/21 1520 11/23/21 0510  WBC 6.8 6.9  HGB 9.4* 9.6*  HCT 29.6* 29.9*  PLT 363 341   Recent Labs    11/23/21 0510  NA 134*  K 3.5  CL 104  CO2 22  GLUCOSE 188*  BUN 19  CREATININE 0.76  CALCIUM 9.0    Intake/Output Summary (Last 24 hours) at 11/23/2021 1243 Last data filed at 11/23/2021 0830 Gross per 24 hour  Intake 476 ml  Output --  Net 476 ml        Physical Exam: Vital Signs Blood pressure 118/80, pulse 71, temperature 98.4 F (36.9 C), temperature source Oral, resp. rate 16, height  (1.702 m), weight 74.1 kg, SpO2 100 %.    General: Alert and oriented x 3, No apparent distress HEENT: Head is normocephalic, atraumatic, PERRLA, EOMI, sclera anicteric, oral mucosa pink and moist, dentition intact, ext ear canals clear,  Neck: Supple without JVD or lymphadenopathy Heart: Reg rate and rhythm. No murmurs rubs or gallops Chest: CTA bilaterally without wheezes, rales, or rhonchi; no distress Abdomen: Soft, non-tender, non-distended, bowel sounds positive. Extremities: No clubbing, cyanosis, or edema. Pulses are 2+ Psych: Pt's affect is appropriate. Pt is cooperative Skin: Clean and intact without signs of breakdown Neuro:  Sensation intact to LT.  Mod dysarthria but understandable speech.  Musculoskeletal: Strength 4/5 left side, 3/5 right side.   Assessment/Plan: 1. Functional deficits which require 3+ hours per day of interdisciplinary therapy in a comprehensive inpatient rehab setting. Physiatrist is providing close team supervision and 24 hour management of active medical problems listed below. Physiatrist and rehab  team continue to assess barriers to discharge/monitor patient progress toward functional and medical goals  Care Tool:  Bathing              Bathing assist       Upper Body Dressing/Undressing Upper body dressing        Upper body assist      Lower Body Dressing/Undressing Lower body dressing            Lower body assist       Toileting Toileting    Toileting assist Assist for toileting: Moderate Assistance - Patient 50 - 74%     Transfers Chair/bed transfer  Transfers assist     Chair/bed transfer assist level: Moderate Assistance - Patient 50 - 74%     Locomotion Ambulation   Ambulation assist              Walk 10 feet activity   Assist           Walk 50 feet activity   Assist           Walk 150 feet activity   Assist           Walk 10 feet on uneven surface  activity   Assist  Wheelchair     Assist               Wheelchair 50 feet with 2 turns activity    Assist            Wheelchair 150 feet activity     Assist          Blood pressure 118/80, pulse 71, temperature 98.4 F (36.9 C), temperature source Oral, resp. rate 16, height  (1.702 m), weight 74.1 kg, SpO2 100 %.  Medical Problem List and Plan: 1. Functional deficits secondary to left basal ganglia infarct             -patient may shower             -ELOS/Goals: 7-9 days S             -Admit to CIR 2.  Antithrombotics: -DVT/anticoagulation:  Pharmaceutical: Heparin             -antiplatelet therapy: Aspirin 81 mg daily and Plavix 75 mg daily for 3 weeks then aspirin alone 3. Pain Management: Tylenol 6/24 Pain reported in rt knee today.  Continue use of Tylenol. 4. Mood/Sleep: LCSW to evaluate and provide emotional support             -antipsychotic agents: n/a 5. Neuropsych/cognition: This patient is not capable of making decisions on her own behalf. 6. Skin/Wound Care: Routine skin care checks 7.  Fluids/Electrolytes/Nutrition: Routine I's and O's and follow-up chemistries 8. Hypertension: Continue lisinopril/HCTZ 6/24 BP well controlled    11/23/2021    9:33 AM 11/23/2021    5:05 AM 11/22/2021    8:05 PM  Vitals with BMI  Systolic 118 98 138  Diastolic 80 60 86  Pulse  71 72    9. Depression continue fluoxetine 20 mg daily 10. Diabetes mellitus-2: A1c 7.7; monitor CBGs             -- SSI before every meal and nightly             -- Continue Semglee 17 units daily 6/24 CBG range 167-236, past 24 hrs. CBG (last 3)  Recent Labs    11/22/21 2133 11/23/21 0619 11/23/21 1210  GLUCAP 167* 172* 236*    11. Hyperlipidemia: Continue atorvastatin 40 mg daily 12.Anemia: Repeat hgb tomorrow.  6/24 Hgb stable at 9.6, trend with weekly labs.    LOS: 1 days A FACE TO FACE EVALUATION WAS PERFORMED  Tressia Miners 11/23/2021, 12:43 PM

## 2021-11-23 NOTE — Evaluation (Signed)
Physical Therapy Assessment and Plan  Patient Details  Name: Hayley Bryant MRN: [id number] Date of Birth: [birth year]  PT Diagnosis: Abnormality of gait, Coordination disorder, and Hemiparesis dominant Rehab Potential: Good ELOS: 5-7 days   Today's Date: 11/23/2021 PT Individual Time: 1345-1505 PT Individual Time Calculation (min): 80 min    Hospital Problem: Principal Problem:   Acute ischemic stroke [facility])   Past Medical History:  Past Medical History:  Diagnosis Date   Anxiety    Depression    Diabetes mellitus    Fibroids    Hypertension    Past Surgical History:  Past Surgical History:  Procedure Laterality Date   CHOLECYSTECTOMY     LYMPH NODE DISSECTION      Assessment & Plan Clinical Impression:  (from ED dc note):Hayley Bryant is a 51 y.o. female with medical history significant of anxiety, depression, insomnia, suicidal overdose, vitamin D deficiency, overweight with a BMI of 25.06 kg/m, uncontrolled type II DM, hypertension, Huntington's-like disease who is brought to the emergency department after waking up in the morning with mild confusion, slurred speech and inability to communicate normally.   Patient transferred to CIR on 11/22/2021 .   Patient currently requires min with mobility secondary to decreased cardiorespiratoy endurance, impaired timing and sequencing, decreased coordination, and decreased motor planning, decreased attention, decreased awareness, decreased problem solving, and decreased safety awareness, and decreased sitting balance, decreased standing balance, decreased postural control, hemiplegia, and decreased balance strategies.  Prior to hospitalization, patient was independent  with mobility and lived with Family in a House home.  Home access is  Level entry.  Patient will benefit from skilled PT intervention to maximize safe functional mobility, minimize fall risk, and decrease caregiver burden for planned discharge home with 24 hour  supervision.  Anticipate patient will benefit from follow up HH at discharge.  PT - End of Session Activity Tolerance: Tolerates < 10 min activity with changes in vital signs Endurance Deficit: Yes Endurance Deficit Description: dyspnea after gait x 170; pt stated "anxiety" PT Assessment Rehab Potential (ACUTE/IP ONLY): Good PT Patient demonstrates impairments in the following area(s): Balance;Safety;Endurance;Sensory;Motor PT Transfers Functional Problem(s): Bed Mobility;Bed to Chair;Car;Furniture PT Locomotion Functional Problem(s): Ambulation;Wheelchair Mobility;Stairs PT Plan PT Intensity: Minimum of 1-2 x/day ,45 to 90 minutes PT Frequency: 5 out of 7 days PT Duration Estimated Length of Stay: 5-7 days PT Treatment/Interventions: Ambulation/gait training;Cognitive remediation/compensation;Discharge planning;DME/adaptive equipment instruction;Functional mobility training;Psychosocial support;Splinting/orthotics;Therapeutic Activities;UE/LE Strength taining/ROM;Visual/perceptual remediation/compensation;Balance/vestibular training;Community reintegration;Neuromuscular re-education;Patient/family education;Stair training;Therapeutic Exercise;UE/LE Coordination activities;Wheelchair propulsion/positioning PT Transfers Anticipated Outcome(s): supervision basic and car PT Locomotion Anticipated Outcome(s): supervision gait wiht LRAD x 200' controlled environment, x 50' home environment, x 200' community environment; supervision up/down threshold with LRAD to enter home;   supervision up/down 13 steps with bil UE support, for access to bed/full bath in home PT Recommendation Recommendations for Other Services: Neuropsych consult Follow Up Recommendations: Home health PT;Outpatient PT (HHPT vs OPPT) Patient destination: Home Equipment Recommended: To be determined Equipment Details: RW or cane   PT Evaluation Precautions/Restrictions Precautions Precautions: Fall Precaution Comments: right  UE hemiparesis Restrictions Weight Bearing Restrictions: No General   Vital SignsTherapy Vitals Temp: 98.5 F (36.9 C) Pulse Rate: 73 Resp: 16 BP: 109/69 Patient Position (if appropriate): Sitting Oxygen Therapy SpO2: 100 % O2 Device: Room Air Pain Pain Assessment Pain Score: 0-No pain Pain Interference Pain Interference Pain Effect on Sleep: 2. Occasionally Pain Interference with Therapy Activities: 1. Rarely or not at all Pain Interference with Day-to-Day Activities: 1. Rarely or  not at all Home Living/Prior Functioning Home Living Available Help at Discharge: Family;Available 24 hours/day Type of Home: House Home Access: Level entry Home Layout: Two level;1/2 bath on main level Alternate Level Stairs-Number of Steps: 13 Alternate Level Stairs-Rails: None Bathroom Shower/Tub: Associate Professor: Yes  Lives With: Family Prior Function Level of Independence: Independent with homemaking with ambulation  Able to Take Stairs?: Yes Driving: No Vocation: Full time employment Leisure: Hobbies-yes (Comment) (reading) Vision/Perception  Vision - History Ability to See in Adequate Light: 0 Adequate  Cognition Overall Cognitive Status: Within Functional Limits for tasks assessed Arousal/Alertness: Lethargic (didn't sleep well last night) Year: 2023 Month: June Day of Week: Correct Memory: Impaired Memory Impairment: Decreased recall of new information Sensation Sensation Light Touch: Appears Intact Proprioception: Impaired Detail Proprioception Impaired Details: Impaired RLE (difficult to assess) Coordination Gross Motor Movements are Fluid and Coordinated: No Heel Shin Test: impaired bil: speed, accuracy and excursion Motor  Motor Motor: Hemiplegia   Trunk/Postural Assessment  Cervical Assessment Cervical Assessment: Exceptions to [facility] (at rest, pt tends to rotate head slightly L) Thoracic Assessment Thoracic  Assessment: Within Functional Limits Lumbar Assessment Lumbar Assessment: Within Functional Limits Postural Control Postural Control: Within Functional Limits  Balance Balance Balance Assessed: Yes Static Sitting Balance Static Sitting - Balance Support: Feet unsupported Static Sitting - Level of Assistance: 5: Stand by assistance Dynamic Sitting Balance Dynamic Sitting - Balance Support: Feet unsupported Dynamic Sitting - Level of Assistance: 5: Stand by assistance Reach (Patient is able to reach ___ inches to right, left, forward, back): 6 inches Dynamic Sitting - Balance Activities: Forward lean/weight shifting;Reaching for objects Static Standing Balance Static Standing - Balance Support: No upper extremity supported Static Standing - Level of Assistance: 5: Stand by assistance Dynamic Standing Balance Dynamic Standing - Balance Support: No upper extremity supported Dynamic Standing - Level of Assistance: 4: Min assist Dynamic Standing - Balance Activities: Reaching for objects Dynamic Standing - Comments: pt demonstrated bil ankle, hip and stepping strategies given external perturbations from all directions Extremity Assessment      RLE Assessment RLE Assessment: Exceptions to [facility] Strength Comments: grossly in sitting, 4/5 hip flexion, knee extension, ankle DF LLE Assessment LLE Assessment: Within Functional Limits  Care Tool Care Tool Bed Mobility Roll left and right activity   Roll left and right assist level: Supervision/Verbal cueing    Sit to lying activity   Sit to lying assist level: Minimal Assistance - Patient > 75%    Lying to sitting on side of bed activity   Lying to sitting on side of bed assist level: the ability to move from lying on the back to sitting on the side of the bed with no back support.: Minimal Assistance - Patient > 75%     Care Tool Transfers Sit to stand transfer   Sit to stand assist level: Minimal Assistance - Patient > 75%     Chair/bed transfer   Chair/bed transfer assist level: Minimal Assistance - Patient > 75%     Toilet transfer   Assist Level: Minimal Assistance - Patient > 75%    Car transfer   Car transfer assist level: Contact Guard/Touching assist      Care Tool Locomotion Ambulation   Assist level: Contact Guard/Touching assist Assistive device: No Device Max distance: 190  Walk 10 feet activity   Assist level: Contact Guard/Touching assist Assistive device: No Device   Walk 50 feet with 2 turns activity   Assist  level: Contact Guard/Touching assist Assistive device: No Device  Walk 150 feet activity   Assist level: Contact Guard/Touching assist Assistive device: No Device  Walk 10 feet on uneven surfaces activity   Assist level: Minimal Assistance - Patient > 75% Assistive device: Hand held assist  Stairs   Assist level: Contact Guard/Touching assist Stairs assistive device: 2 hand rails Max number of stairs: 12  Walk up/down 1 step activity   Walk up/down 1 step (curb) assist level: Contact Guard/Touching assist Walk up/down 1 step or curb assistive device: 2 hand rails  Walk up/down 4 steps activity   Walk up/down 4 steps assist level: Contact Guard/Touching assist Walk up/down 4 steps assistive device: 2 hand rails  Walk up/down 12 steps activity   Walk up/down 12 steps assist level: Contact Guard/Touching assist Walk up/down 12 steps assistive device: 2 hand rails  Pick up small objects from floor   Pick up small object from the floor assist level: Contact Guard/Touching assist Pick up small object from the floor assistive device: none  Wheelchair Is the patient using a wheelchair?: Yes Type of Wheelchair: Manual   Wheelchair assist level: Maximal Assistance - Patient 25 - 49% Max wheelchair distance: 50  Wheel 50 feet with 2 turns activity   Assist Level: Maximal Assistance - Patient 25 - 49%  Wheel 150 feet activity Wheelchair 150 feet activity did not occur:  Safety/medical concerns (R hand inattention)      Refer to Care Plan for Long Term Goals  SHORT TERM GOAL WEEK 1 PT Short Term Goal 1 (Week 1): = LTGs due to ELOS  Recommendations for other services: Neuropsych  Skilled Therapeutic Intervention  Pt received seated in recliner.  Husband and son there, who often spoke for her.  PT gently educated family about giving pt time to speak; they voiced understanding. During session, neuromuscular re-education via multimodal cues, demo and visual feedback for seated: R foot tapping on 2-3 floor targets; supine in hook lying- bil bridging with adductor squeeze and bil UEs on chest, bil lower trunk rotation.  At end of session, pt requested getting back in bed due to fatigue.  Bed alarm set, needs left at hand and family present. Mobility Bed Mobility Bed Mobility: Rolling Right;Rolling Left;Supine to Sit;Sit to Supine Rolling Right: Supervision/verbal cueing Rolling Left: Supervision/Verbal cueing Supine to Sit: Supervision/Verbal cueing Sit to Supine: Supervision/Verbal cueing Transfers Transfers: Sit to Stand;Stand to Sit;Stand Pivot Transfers Sit to Stand: Contact Guard/Touching assist Stand to Sit: Minimal Assistance - Patient > 75% Stand Pivot Transfers: Minimal Assistance - Patient > 75% Stand Pivot Transfer Details: Verbal cues for precautions/safety;Verbal cues for technique Stand Pivot Transfer Details (indicate cue type and reason): poor eccentric control when sitting; inattention to hips position in front of seat Transfer (Assistive device): None Locomotion  Gait Ambulation: Yes Gait Assistance: Contact Guard/Touching assist Gait Distance (Feet): 190 Feet Assistive device: None Gait Gait: Yes Gait Pattern: Step-through pattern;Decreased trunk rotation;Narrow base of support;Decreased hip/knee flexion - right;Trunk rotated posteriorly on left (lacks push off bil) Gait velocity: 14 sec/10 MWT = 2.34'/sec; decreased functional  deficiency Stairs / Additional Locomotion Stairs: Yes Stairs Assistance: Contact Guard/Touching assist Stair Management Technique: Two rails Number of Stairs: 12 Height of Stairs: 6 (and 3) Ramp: Contact Guard/touching assist Wheelchair Mobility Wheelchair Mobility: Yes Wheelchair Assistance: Maximal Assistance - Patient 25 - 49% Wheelchair Propulsion: Both upper extremities Wheelchair Parts Management: Needs assistance Distance: 50   Discharge Criteria: Patient will be discharged from PT if patient  refuses treatment 3 consecutive times without medical reason, if treatment goals not met, if there is a change in medical status, if patient makes no progress towards goals or if patient is discharged from hospital.  The above assessment, treatment plan, treatment alternatives and goals were discussed and mutually agreed upon: by patient and by family  Jadalee Westcott 11/23/2021, 5:32 PM

## 2021-11-24 LAB — GLUCOSE, CAPILLARY
Glucose-Capillary: 139 mg/dL — ABNORMAL HIGH (ref 70–99)
Glucose-Capillary: 158 mg/dL — ABNORMAL HIGH (ref 70–99)
Glucose-Capillary: 247 mg/dL — ABNORMAL HIGH (ref 70–99)
Glucose-Capillary: 178 mg/dL — ABNORMAL HIGH (ref 70–99)

## 2021-11-24 NOTE — Progress Notes (Signed)
PROGRESS NOTE   Subjective/Complaints: Patient seen in bed today was very tired, but slept throughout the night.  Maybe making up for lost sleep as she didn't sleep well last night.  ROS: Negative for fever, chills, CP, SOB, N/V/C/D. + weakness and myalgia  Objective:   No results found. Recent Labs    11/22/21 1520 11/23/21 0510  WBC 6.8 6.9  HGB 9.4* 9.6*  HCT 29.6* 29.9*  PLT 363 341   Recent Labs    11/23/21 0510  NA 134*  K 3.5  CL 104  CO2 22  GLUCOSE 188*  BUN 19  CREATININE 0.76  CALCIUM 9.0    Intake/Output Summary (Last 24 hours) at 11/24/2021 1235 Last data filed at 11/24/2021 0730 Gross per 24 hour  Intake 656 ml  Output --  Net 656 ml        Physical Exam: Vital Signs Blood pressure (!) 106/54, pulse 69, temperature 98.3 F (36.8 C), temperature source Oral, resp. rate 15, height  (1.702 m), weight 74.1 kg, SpO2 99 %.    General: Alert and oriented x 3, No apparent distress HEENT: Head is normocephalic, atraumatic, PERRLA, EOMI, sclera anicteric, oral mucosa pink and moist, dentition intact, ext ear canals clear,  Neck: Supple without JVD or lymphadenopathy Heart: Reg rate and rhythm. No murmurs rubs or gallops Chest: CTA bilaterally without wheezes, rales, or rhonchi; no distress Abdomen: Soft, non-tender, non-distended, bowel sounds positive. Extremities: No clubbing, cyanosis, or edema. Pulses are 2+ Psych: Pt's affect is appropriate. Pt is cooperative Skin: Clean and intact without signs of breakdown Neuro:  Sensation intact to LT.  Mod dysarthria but understandable speech.  Musculoskeletal: Strength 4/5 left side, 3/5 right side.   Assessment/Plan: 1. Functional deficits which require 3+ hours per day of interdisciplinary therapy in a comprehensive inpatient rehab setting. Physiatrist is providing close team supervision and 24 hour management of active medical problems listed  below. Physiatrist and rehab team continue to assess barriers to discharge/monitor patient progress toward functional and medical goals  Care Tool:  Bathing    Body parts bathed by patient: Chest, Abdomen, Front perineal area, Buttocks, Face, Left lower leg, Right lower leg, Left upper leg, Right upper leg, Right arm   Body parts bathed by helper: Left arm     Bathing assist Assist Level: Minimal Assistance - Patient > 75%     Upper Body Dressing/Undressing Upper body dressing   What is the patient wearing?: Pull over shirt, Bra    Upper body assist Assist Level: Moderate Assistance - Patient 50 - 74%    Lower Body Dressing/Undressing Lower body dressing      What is the patient wearing?: Underwear/pull up, Pants     Lower body assist Assist for lower body dressing: Moderate Assistance - Patient 50 - 74%     Toileting Toileting    Toileting assist Assist for toileting: Moderate Assistance - Patient 50 - 74%     Transfers Chair/bed transfer  Transfers assist     Chair/bed transfer assist level: Minimal Assistance - Patient > 75%     Locomotion Ambulation   Ambulation assist      Assist level: Contact  Guard/Touching assist Assistive device: No Device Max distance: 190   Walk 10 feet activity   Assist     Assist level: Contact Guard/Touching assist Assistive device: No Device   Walk 50 feet activity   Assist    Assist level: Contact Guard/Touching assist Assistive device: No Device    Walk 150 feet activity   Assist    Assist level: Contact Guard/Touching assist Assistive device: No Device    Walk 10 feet on uneven surface  activity   Assist     Assist level: Minimal Assistance - Patient > 75% Assistive device: Hand held assist   Wheelchair     Assist Is the patient using a wheelchair?: Yes Type of Wheelchair: Manual    Wheelchair assist level: Maximal Assistance - Patient 25 - 49% Max wheelchair distance: 50     Wheelchair 50 feet with 2 turns activity    Assist        Assist Level: Maximal Assistance - Patient 25 - 49%   Wheelchair 150 feet activity     Assist  Wheelchair 150 feet activity did not occur: Safety/medical concerns (R hand inattention)       Blood pressure (!) 106/54, pulse 69, temperature 98.3 F (36.8 C), temperature source Oral, resp. rate 15, height  (1.702 m), weight 74.1 kg, SpO2 99 %.  Medical Problem List and Plan: 1. Functional deficits secondary to left basal ganglia infarct             -patient may shower             -ELOS/Goals: 7-9 days S             -Admit to CIR 2.  Antithrombotics: -DVT/anticoagulation:  Pharmaceutical: Heparin             -antiplatelet therapy: Aspirin 81 mg daily and Plavix 75 mg daily for 3 weeks then aspirin alone 3. Pain Management: Tylenol 6/24 Pain reported in rt knee today.  Continue use of Tylenol. 6/25 No pain reported this morning. 4. Mood/Sleep: LCSW to evaluate and provide emotional support             -antipsychotic agents: n/a 5. Neuropsych/cognition: This patient is not capable of making decisions on her own behalf. 6. Skin/Wound Care: Routine skin care checks 7. Fluids/Electrolytes/Nutrition: Routine I's and O's and follow-up chemistries 8. Hypertension: Continue lisinopril/HCTZ 6/25 BP well controlled    11/24/2021    8:49 AM 11/24/2021    4:15 AM 11/23/2021    7:40 PM  Vitals with BMI  Systolic 106 117 409  Diastolic 54 78 66  Pulse  69 65    9. Depression continue fluoxetine 20 mg daily 10. Diabetes mellitus-2: A1c 7.7; monitor CBGs             -- SSI before every meal and nightly             -- Continue Semglee 17 units daily 6/24 CBG range 167-236, past 24 hrs. 6/25 Continue Semglee 17 U daily. CBG (last 3)  Recent Labs    11/23/21 2026 11/24/21 0604 11/24/21 1138  GLUCAP 230* 139* 158*    11. Hyperlipidemia: Continue atorvastatin 40 mg daily 12.Anemia: Repeat hgb tomorrow.  6/25  Hgb stable at 9.6, trend with weekly labs.    LOS: 2 days A FACE TO FACE EVALUATION WAS PERFORMED  Tressia Miners 11/24/2021, 12:35 PM

## 2021-11-25 DIAGNOSIS — I10 Essential (primary) hypertension: Secondary | ICD-10-CM

## 2021-11-25 DIAGNOSIS — Z794 Long term (current) use of insulin: Secondary | ICD-10-CM

## 2021-11-25 DIAGNOSIS — E1169 Type 2 diabetes mellitus with other specified complication: Secondary | ICD-10-CM

## 2021-11-25 DIAGNOSIS — F32A Depression, unspecified: Secondary | ICD-10-CM

## 2021-11-25 LAB — GLUCOSE, CAPILLARY
Glucose-Capillary: 143 mg/dL — ABNORMAL HIGH (ref 70–99)
Glucose-Capillary: 174 mg/dL — ABNORMAL HIGH (ref 70–99)
Glucose-Capillary: 168 mg/dL — ABNORMAL HIGH (ref 70–99)
Glucose-Capillary: 146 mg/dL — ABNORMAL HIGH (ref 70–99)

## 2021-11-25 MED ORDER — EMPAGLIFLOZIN 10 MG PO TABS
10.0000 mg | ORAL_TABLET | Freq: Every day | ORAL | Status: DC
  Administered 2021-11-25 – 2021-12-06 (×12): 10 mg via ORAL
  Filled 2021-11-25 (×12): qty 1

## 2021-11-25 NOTE — IPOC Note (Signed)
Overall Plan of Care [facility]) Patient Details Name: Hayley Bryant MRN: [id number] DOB: 1970/09/27  Admitting Diagnosis: Acute ischemic stroke [facility])  Hospital Problems: Principal Problem:   Acute ischemic stroke [facility])     Functional Problem List: Nursing Endurance, Medication Management, Safety  PT Balance, Safety, Endurance, Sensory, Motor  OT Balance, Cognition, Motor, Sensory, Perception  SLP Cognition, Linguistic  TR         Basic ADL's: OT Eating, Grooming, Bathing, Dressing, Toileting     Advanced  ADL's: OT Simple Meal Preparation     Transfers: PT Bed Mobility, Bed to Chair, Car, Occupational psychologist, Research scientist (life sciences): PT Ambulation, Psychologist, prison and probation services, Stairs     Additional Impairments: OT Fuctional Use of Upper Extremity  SLP Communication, Social Cognition expression Memory, Awareness  TR      Anticipated Outcomes Item Anticipated Outcome  Self Feeding modified independent  Swallowing      Basic self-care  supervision  Toileting  supervision   Bathroom Transfers supervision  Bowel/Bladder  n/a  Transfers  supervision basic and car  Locomotion  supervision gait wiht LRAD x 200' controlled environment, x 50' home environment, x 200' community environment; supervision up/down threshold with LRAD to enter home;   supervision up/down 13 steps with bil UE support, for access to bed/full bath in home  Communication  Supervision  Cognition  Supervision  Pain  n/a  Safety/Judgment  maintain safety w cues   Therapy Plan: PT Intensity: Minimum of 1-2 x/day ,45 to 90 minutes PT Frequency: 5 out of 7 days PT Duration Estimated Length of Stay: 5-7 days OT Intensity: Minimum of 1-2 x/day, 45 to 90 minutes OT Frequency: 5 out of 7 days OT Duration/Estimated Length of Stay: 10-12 days SLP Intensity: Minumum of 1-2 x/day, 30 to 90 minutes SLP Frequency: 3 to 5 out of 7 days SLP Duration/Estimated Length of Stay: 10-12 days   Team  Interventions: Nursing Interventions Disease Management/Prevention, Medication Management, Discharge Planning  PT interventions Ambulation/gait training, Cognitive remediation/compensation, Discharge planning, DME/adaptive equipment instruction, Functional mobility training, Psychosocial support, Splinting/orthotics, Therapeutic Activities, UE/LE Strength taining/ROM, Visual/perceptual remediation/compensation, Warden/ranger, Firefighter, Neuromuscular re-education, Equities trader education, Museum/gallery curator, Therapeutic Exercise, UE/LE Coordination activities, Wheelchair propulsion/positioning  OT Interventions Warden/ranger, Cognitive remediation/compensation, Discharge planning, Community reintegration, Disease mangement/prevention, Fish farm manager, Functional mobility training, Functional electrical stimulation, Neuromuscular re-education, Pain management, Psychosocial support, Patient/family education, Self Care/advanced ADL retraining, Therapeutic Activities, UE/LE Strength taining/ROM, UE/LE Coordination activities, Splinting/orthotics, Therapeutic Exercise  SLP Interventions Cognitive remediation/compensation, Cueing hierarchy, Internal/external aids, Environmental controls, Patient/family education, Speech/Language facilitation  TR Interventions    SW/CM Interventions Discharge Planning, Psychosocial Support, Patient/Family Education   Barriers to Discharge MD  Medical stability and Home enviroment access/loayout  Nursing Decreased caregiver support, Home environment access/layout 2 level; level entry, 1/2 bath on main, 13 steps to bedroom with spouse and 2 adult sons  PT      OT      SLP      SW Decreased caregiver support, Community education officer for SNF coverage     Team Discharge Planning: Destination: PT-Home ,OT- Home , SLP-Home Projected Follow-up: PT-Home health PT, Outpatient PT (HHPT vs OPPT), OT-  Outpatient OT, 24 hour  supervision/assistance, SLP-Home Health SLP Projected Equipment Needs: PT-To be determined, OT- To be determined, SLP-None recommended by SLP Equipment Details: PT-RW or cane, OT-  Patient/family involved in discharge planning: PT- Patient, Family member/caregiver,  OT-Patient, Family member/caregiver, SLP-Patient, Family member/caregiver  MD ELOS: 7-9 Medical  Rehab Prognosis:  Excellent Assessment: The patient has been admitted for CIR therapies with the diagnosis of left basal ganglia infarct. The team will be addressing functional mobility, strength, stamina, balance, safety, adaptive techniques and equipment, self-care, bowel and bladder mgt, patient and caregiver education. Goals have been set at supervision. Anticipated discharge destination is home.        See Team Conference Notes for weekly updates to the plan of care

## 2021-11-25 NOTE — Progress Notes (Signed)
Inpatient Rehabilitation Care Coordinator Assessment and Plan Patient Details  Name: Hayley Bryant MRN: [id number] Date of Birth: Mar 19, 1971  Today's Date: 11/25/2021  Hospital Problems: Principal Problem:   Acute ischemic stroke [facility])  Past Medical History:  Past Medical History:  Diagnosis Date   Anxiety    Depression    Diabetes mellitus    Fibroids    Hypertension    Past Surgical History:  Past Surgical History:  Procedure Laterality Date   CHOLECYSTECTOMY     LYMPH NODE DISSECTION     Social History:  reports that she has never smoked. She has never used smokeless tobacco. She reports that she does not drink alcohol and does not use drugs.  Family / Support Systems Marital Status: Married Patient Roles: Spouse, Parent, Other (Comment) (employee) Spouse/Significant Other: Hayley Bryant [phone] Children: Hayley Bryant [phone]   Hayley Bryant [phone] Other Supports: Daughter who is local and involved Anticipated Caregiver: Husband and children Ability/Limitations of Caregiver: All currently work but will work on a plan for 24/7 supervision Caregiver Availability: 24/7 Family Dynamics: Close knit family who pull together when times of crisis. Pt is usually the one who is the [place] and keeps them on track. Now husband is stepping in and directing  Social History Preferred language: English Religion: None Cultural Background: No issues Education: Associates degree Health Literacy - How often do you need to have someone help you when you read instructions, pamphlets, or other written material from your doctor or pharmacy?: Never Writes: Yes Employment Status: Employed Name of Employer: [employer] Return to Work Plans: Hopes to return once is able to and recovered enough to do so Marine scientist Issues: No issues Guardian/Conservator: None-according to MD pt is capable but has speech deficits so will look toward her husband to make any  decisions while here with pt's input   Abuse/Neglect Abuse/Neglect Assessment Can Be Completed: Yes Physical Abuse: Denies Verbal Abuse: Denies Sexual Abuse: Denies Exploitation of patient/patient's resources: Denies Self-Neglect: Denies  Patient response to: Social Isolation - How often do you feel lonely or isolated from those around you?: Never  Emotional Status Pt's affect, behavior and adjustment status: Pt is motivated to recover and is tyring to communicate her needs. She becomes frustrated at times but she keeps trying. She has always been independent and wants to get back to this level if possible. Recent Psychosocial Issues: other health issues Psychiatric History: History of depression and anxiety takes medications which PCP monitors and feels they help her. With this stressor may benefit from seeing neuro-psych while here once speech improves more. Substance Abuse History: No issues  Patient / Family Perceptions, Expectations & Goals Pt/Family understanding of illness & functional limitations: Pt and husband can explain her stroke and deficits as a result of this. Both talk with the MD and feel they have a good understanding of her treatment plan moving forward. Premorbid pt/family roles/activities: Wife, mom, employee, church member, etc Anticipated changes in roles/activities/participation: resume Pt/family expectations/goals: Pt states: " I want to do good."  Husband states: " I hope she does well she is already making progress since coming into the hospital."  [employer]: None Premorbid Home Care/DME Agencies: None Transportation available at discharge: Self and family Is the patient able to respond to transportation needs?: Yes In the past 12 months, has lack of transportation kept you from meetings, work, or from getting things needed for daily living?: No Resource referrals recommended: Neuropsychology  Discharge Planning Living  Arrangements: Spouse/significant  other, Children Support Systems: Spouse/significant other, Children, Church/faith community Type of Residence: Private residence Insurance Resources: Media planner (specify) Counselling psychologist) Financial Resources: Employment, Garment/textile technologist Screen Referred: No Living Expenses: Banker Management: Patient, Spouse Does the patient have any problems obtaining your medications?: No Home Management: Pt family will pitch in now Patient/Family Preliminary Plans: Return home with husband and two son's whom live with them. Their daughter lives outside the home but is involved. Between all of them they hope to provide 24/7 supervision level. All do work and hope pt continues to improve while here Care Coordinator Barriers to Discharge: Decreased caregiver support, Insurance for SNF coverage Care Coordinator Anticipated Follow Up Needs: HH/OP  Clinical Impression Pleasant couple pt is motivated to do well here and recover. Have done a letter for her work and attempting to reach [employer] to email it to her. Will update once team conference Wednesday  Lucy Chris 11/25/2021, 10:46 AM

## 2021-11-25 NOTE — Discharge Summary (Signed)
Physician Discharge Summary  Patient ID: Hayley Bryant MRN: [id number] DOB/AGE: 50-May-1972 50 y.o.  Admit date: 11/22/2021 Discharge date: 12/06/2021  Discharge Diagnoses:  Principal Problem:   Acute ischemic stroke [facility]) Active problems: Functional deficits secondary to acute infarct involving left basal ganglia Hypertension Depression Diabetes mellitus type 2 Hyperlipidemia Anemia [place]  Discharged Condition: stable  Significant Diagnostic Studies: Labs:  Basic Metabolic Panel: Recent Labs  Lab 12/02/21 0606  NA 136  K 3.9  CL 100  CO2 24  GLUCOSE 104*  BUN 19  CREATININE 0.81  CALCIUM 9.2    CBC: Recent Labs  Lab 12/02/21 0606  WBC 6.6  HGB 9.9*  HCT 30.7*  MCV 85.5  PLT 342    CBG: Recent Labs  Lab 12/04/21 2244 12/05/21 1152 12/05/21 1702 12/05/21 2017 12/06/21 0536  GLUCAP 139* 118* 179* 116* 103*    Brief HPI:   Hayley Bryant is a 51 y.o. female who presented to [facility] long emergency department on 11/15/2021 with slurred speech and confusion.  Code stroke activated and neurology consulted.  Out of the window for tenecteplase.  CT head and CTA of the head performed without acute findings or LVO.  Aspirin and Plavix therapy started on admission.  She was transferred to [facility].  Numbness of hypertension allowed hypercoagulable work-up negative.  To continue aspirin and Plavix for 3 weeks then aspirin alone.   Hospital Course: Hayley Bryant was admitted to rehab 11/22/2021 for inpatient therapies to consist of PT, ST and OT at least three hours five days a week. Past admission physiatrist, therapy team and rehab RN have worked together to provide customized collaborative inpatient rehab.  Follow-up CBC reveals hemoglobin of 9.6 and stable. Right knee pain reported improved with Tylenol. Attention to sleep hygiene and nutrition. Continued to improve and ready for discharge home with family on 7/7.  Blood pressures were monitored on TID basis and  remained controlled on lisinopril and HCTZ.  Diabetes has been monitored with ac/hs CBG checks and SSI was use prn for tighter BS control.  Semglee 17 units daily continued.  Home Jardiance 10 mg daily restarted on 6/26. Trulicity held. Semglee decreased to 15 units daily.   Rehab course: During patient's stay in rehab weekly team conferences were held to monitor patient's progress, set goals and discuss barriers to discharge. At admission, patient required mod assist with basic self-care skills and assist with mobility.   She has had improvement in activity tolerance, balance, postural control as well as ability to compensate for deficits. She has had improvement in functional use RUE/LUE  and RLE/LLE as well as improvement in awareness. Overall min assist with ADLs. Discharge to ambulatory level Supervision per PT.    Disposition: Home Discharge disposition: 01-Home or Self Care      Diet: Carb modified  Special Instructions:  No driving, alcohol consumption or tobacco use.   Please check your fingerstick blood sugars 4 times a day and record. Please bring this information to your primary care provider.  30-35 minutes were spent on discharge planning and discharge summary.   Discharge Instructions     Ambulatory referral to Neurology   Complete by: As directed    An appointment is requested in approximately: 4 weeks   Ambulatory referral to Physical Medicine Rehab   Complete by: As directed    Discharge patient   Complete by: As directed    Discharge disposition: 01-Home or Self Care   Discharge patient date: 12/06/2021  Allergies as of 12/06/2021       Reactions   Metformin And Related Other (See Comments)   Pt. States it makes her heart flutter.         Medication List     STOP taking these medications    ibuprofen 200 MG tablet Commonly known as: ADVIL   Melatonin 10 MG Tabs   methocarbamol 500 MG tablet Commonly known as: ROBAXIN   metroNIDAZOLE  500 MG tablet Commonly known as: FLAGYL   naphazoline-pheniramine 0.025-0.3 % ophthalmic solution Commonly known as: NAPHCON-A       TAKE these medications    acetaminophen 325 MG tablet Commonly known as: TYLENOL Take 1-2 tablets (325-650 mg total) by mouth every 4 (four) hours as needed for mild pain. What changed:  medication strength how much to take when to take this reasons to take this   aspirin EC 81 MG tablet Take 1 tablet (81 mg total) by mouth daily. Swallow whole. Start taking on: December 07, 2021   atorvastatin 40 MG tablet Commonly known as: LIPITOR Take 1 tablet (40 mg total) by mouth daily. Start taking on: December 07, 2021 What changed:  medication strength how much to take   Basaglar KwikPen 100 UNIT/ML Inject 15 Units into the skin daily. What changed:  how much to take Another medication with the same name was removed. Continue taking this medication, and follow the directions you see here.   blood glucose meter kit and supplies Kit Dispense based on patient and insurance preference. Use up to four times daily as directed.   diclofenac Sodium 1 % Gel Commonly known as: VOLTAREN Apply 2 g topically 4 (four) times daily.   empagliflozin 10 MG Tabs tablet Commonly known as: Jardiance Take 1 tablet (10 mg total) by mouth daily.   FLUoxetine 20 MG capsule Commonly known as: PROZAC Take 3 capsules (60 mg total) by mouth daily.   lisinopril-hydrochlorothiazide 20-25 MG tablet Commonly known as: ZESTORETIC Take 1 tablet by mouth daily.   traZODone 50 MG tablet Commonly known as: DESYREL Take 0.5-1 tablets (25-50 mg total) by mouth at bedtime as needed for sleep.   Trulicity 3 IH/0.3UU Sopn Generic drug: Dulaglutide Inject 3 mg as directed once a week.        Follow-up Information     Barrie Lyme, FNP. Call.   Specialty: Nurse Practitioner Why: Call on Monday to make arrangements for hospital follow-up appointment Contact  information: [place] [address] [phone]         Jennye Boroughs, MD Follow up.   Specialty: Physical Medicine and Rehabilitation Why: office will call you to arrange your appt (sent) Contact information: [address] [place] [address] [phone]         [facility] Follow up.   Why: Call on Monday to make arrangements for hospital follow-up appointment Contact information: [address]     [place] [id number] [phone]                Signed: Barbie Banner 12/06/2021, 10:45 AM

## 2021-11-26 LAB — GLUCOSE, CAPILLARY
Glucose-Capillary: 136 mg/dL — ABNORMAL HIGH (ref 70–99)
Glucose-Capillary: 122 mg/dL — ABNORMAL HIGH (ref 70–99)
Glucose-Capillary: 153 mg/dL — ABNORMAL HIGH (ref 70–99)
Glucose-Capillary: 132 mg/dL — ABNORMAL HIGH (ref 70–99)

## 2021-11-26 NOTE — Progress Notes (Signed)
Physical Therapy Session Note  Patient Details  Name: Hayley Bryant MRN: [id number] Date of Birth: 1970-10-04  Today's Date: 11/26/2021 PT Individual Time: 0806-0903 PT Individual Time Calculation (min): 57 min   Short Term Goals: Week 1:  PT Short Term Goal 1 (Week 1): = LTGs due to ELOS   Skilled Therapeutic Interventions/Progress Updates:  Patient supine in bed on entrance to room. Patient alert and agreeable to PT session.   Patient with no pain complaint throughout session.  Therapeutic Activity: Bed Mobility: Patient performed supine <> sit with supervision. No cues for technique required. Transfers: Patient performed sit<>stand and stand pivot transfers throughout session with CGA/ close supervision improving to supervision. Provided verbal cues for safety/ technique.  Gait Training:  Patient ambulated >200' x2/ 130' x1 using RW with close supervision/ CGA. Demonstrated slow but consistent pace. Provided vc/ tc for foot clearance and upright posture.  Completed 10MWT with time of 48.7sec with use of RW and close supervision. Walking speed calculated at 0.21 m/s - indicative of being a household ambulator.  Pt guided in stair training with physical demonstration and verbal instructions provided prior to performance. Pt is able to complete twelve 6" steps using BHR with alternating step pattern to ascend. Guided pt in leading with RLE to descend and CGA on first 4 steps with pt self selecting to alternate step pattern to descend. VC provided at initiation of each direction for leading LE.    Patient seated upright  in recliner at end of session with brakes locked, belt alarm set, and all needs within reach.   Therapy Documentation Precautions:  Precautions Precautions: Fall Precaution Comments: right UE hemiparesis Restrictions Weight Bearing Restrictions: No General:   Vital Signs: Therapy Vitals Temp: 98.5 F (36.9 C) Temp Source: Oral Pulse Rate: 85 Resp:  18 BP: 102/69 Patient Position (if appropriate): Sitting Oxygen Therapy SpO2: 100 % O2 Device: Room Air Pain: Pain Assessment Pain Scale: 0-10 Pain Score: 0-No pain Mobility:   Locomotion :    Trunk/Postural Assessment :    Balance: Standardized Balance Assessment Standardized Balance Assessment: Berg Balance Test Berg Balance Test Sit to Stand: Able to stand  independently using hands Standing Unsupported: Able to stand 2 minutes with supervision Sitting with Back Unsupported but Feet Supported on Floor or Stool: Able to sit safely and securely 2 minutes Stand to Sit: Controls descent by using hands Transfers: Able to transfer with verbal cueing and /or supervision Standing Unsupported with Eyes Closed: Able to stand 10 seconds with supervision Standing Ubsupported with Feet Together: Needs help to attain position but able to stand for 30 seconds with feet together From Standing, Reach Forward with Outstretched Arm: Can reach forward >12 cm safely (5") From Standing Position, Pick up Object from Floor: Able to pick up shoe, needs supervision From Standing Position, Turn to Look Behind Over each Shoulder: Turn sideways only but maintains balance Turn 360 Degrees: Needs close supervision or verbal cueing Standing Unsupported, Alternately Place Feet on Step/Stool: Able to complete >2 steps/needs minimal assist Standing Unsupported, One Foot in Front: Needs help to step but can hold 15 seconds Standing on One Leg: Unable to try or needs assist to prevent fall Total Score: 30     Therapy/Group: Individual Therapy  Alger Simons PT, DPT, CSRS 11/26/2021, 9:04 AM

## 2021-11-26 NOTE — Progress Notes (Signed)
PROGRESS NOTE   Subjective/Complaints:  She asks if IV can be removed. No additional concerns.   ROS: Negative for fever, chills, HA, CP, SOB, N/V/C/D. + weakness and myalgia  Objective:   No results found. No results for input(s): "WBC", "HGB", "HCT", "PLT" in the last 72 hours.  No results for input(s): "NA", "K", "CL", "CO2", "GLUCOSE", "BUN", "CREATININE", "CALCIUM" in the last 72 hours.   Intake/Output Summary (Last 24 hours) at 11/26/2021 1358 Last data filed at 11/26/2021 0700 Gross per 24 hour  Intake 120 ml  Output --  Net 120 ml         Physical Exam: Vital Signs Blood pressure 104/70, pulse 87, temperature 97.8 F (36.6 C), resp. rate 16, height 5\' 7"  (1.702 m), weight 74.1 kg, SpO2 100 %.    General: Alert and oriented x 3, No apparent distress HEENT: Head is normocephalic, atraumatic, PERRLA, EOMI, sclera anicteric, oral mucosa pink and moist Neck: Supple  Heart: Reg rate and rhythm. No murmurs rubs or gallops Chest: CTA bilaterally without wheezes, rales, or rhonchi;  non-labored Abdomen: Soft, non-tender, non-distended, bowel sounds positive. Extremities: No clubbing, cyanosis, or edema. Pulses are 2+ Psych: Pt's affect is flat. Pt is cooperative Skin: Clean and intact without signs of breakdown,warm and dry Neuro:  Sensation intact to LT.  Mod dysarthria but understandable speech.  Musculoskeletal: Strength 4/5 left side, 3/5 right side.   Assessment/Plan: 1. Functional deficits which require 3+ hours per day of interdisciplinary therapy in a comprehensive inpatient rehab setting. Physiatrist is providing close team supervision and 24 hour management of active medical problems listed below. Physiatrist and rehab team continue to assess barriers to discharge/monitor patient progress toward functional and medical goals  Care Tool:  Bathing    Body parts bathed by patient: Chest, Abdomen, Front  perineal area, Buttocks, Face, Left lower leg, Right lower leg, Left upper leg, Right upper leg, Right arm   Body parts bathed by helper: Left arm     Bathing assist Assist Level: Minimal Assistance - Patient > 75%     Upper Body Dressing/Undressing Upper body dressing   What is the patient wearing?: Pull over shirt, Bra    Upper body assist Assist Level: Moderate Assistance - Patient 50 - 74%    Lower Body Dressing/Undressing Lower body dressing      What is the patient wearing?: Underwear/pull up, Pants     Lower body assist Assist for lower body dressing: Moderate Assistance - Patient 50 - 74%     Toileting Toileting    Toileting assist Assist for toileting: Minimal Assistance - Patient > 75%     Transfers Chair/bed transfer  Transfers assist     Chair/bed transfer assist level: Contact Guard/Touching assist     Locomotion Ambulation   Ambulation assist      Assist level: Contact Guard/Touching assist Assistive device: No Device Max distance: 190   Walk 10 feet activity   Assist     Assist level: Contact Guard/Touching assist Assistive device: No Device   Walk 50 feet activity   Assist    Assist level: Contact Guard/Touching assist Assistive device: No Device    Walk 150  feet activity   Assist    Assist level: Contact Guard/Touching assist Assistive device: No Device    Walk 10 feet on uneven surface  activity   Assist     Assist level: Minimal Assistance - Patient > 75% Assistive device: Hand held assist   Wheelchair     Assist Is the patient using a wheelchair?: Yes Type of Wheelchair: Manual    Wheelchair assist level: Maximal Assistance - Patient 25 - 49% Max wheelchair distance: 50    Wheelchair 50 feet with 2 turns activity    Assist        Assist Level: Maximal Assistance - Patient 25 - 49%   Wheelchair 150 feet activity     Assist  Wheelchair 150 feet activity did not occur:  (R hand  inattention)   Assist Level: Maximal Assistance - Patient 25 - 49% (based on PT eval documentation)   Blood pressure 104/70, pulse 87, temperature 97.8 F (36.6 C), resp. rate 16, height 5\' 7"  (1.702 m), weight 74.1 kg, SpO2 100 %.  Medical Problem List and Plan: 1. Functional deficits secondary to left basal ganglia infarct             -patient may shower             -ELOS/Goals: 7-9 days S             -Continue Cir with PT, OT, SLP  -remove IV 2.  Antithrombotics: -DVT/anticoagulation:  Pharmaceutical: Heparin             -antiplatelet therapy: Aspirin 81 mg daily and Plavix 75 mg daily for 3 weeks then aspirin alone 3. Pain Management: Tylenol 6/24 Pain reported in rt knee today.  Continue use of Tylenol. 6/25 No pain reported this morning. 4. Mood/Sleep: LCSW to evaluate and provide emotional support             -antipsychotic agents: n/a 5. Neuropsych/cognition: This patient is not capable of making decisions on her own behalf. 6. Skin/Wound Care: Routine skin care checks 7. Fluids/Electrolytes/Nutrition: Routine I's and O's and follow-up chemistries 8. Hypertension: Continue lisinopril/HCTZ 6/27 continues to have good control    11/26/2021    1:10 PM 11/26/2021    6:19 AM 11/25/2021    7:28 PM  Vitals with BMI  Systolic 104 102 119  Diastolic 70 69 84  Pulse 87 85 75    9. Depression continue fluoxetine 60 mg daily Mood reported to be stable, continue fluoxetine 60mg  daily 10. Diabetes mellitus-2: A1c 7.7; monitor CBGs             -- SSI before every meal and nightly             -- Continue Semglee 17 units daily  -Home meds include Jardiance and trulicity   6/24 CBG range 167-236, past 24 hrs. 6/25 Continue Semglee 17 U daily. CBG (last 3)  6/26 Will restart home jardiance 10mg  Recent Labs    11/25/21 2043 11/26/21 0622 11/26/21 1211  GLUCAP 146* 136* 122*   6/27 CBG improved, monitor trend  11. Hyperlipidemia: Continue atorvastatin 40 mg daily,counseling  on healthy diet 12.Anemia: Repeat hgb tomorrow.  6/25 Hgb stable at 9.6, trend with weekly labs.    LOS: 4 days A FACE TO FACE EVALUATION WAS PERFORMED  Fanny Dance 11/26/2021, 1:58 PM

## 2021-11-27 DIAGNOSIS — E8809 Other disorders of plasma-protein metabolism, not elsewhere classified: Secondary | ICD-10-CM

## 2021-11-27 LAB — GLUCOSE, CAPILLARY
Glucose-Capillary: 98 mg/dL (ref 70–99)
Glucose-Capillary: 116 mg/dL — ABNORMAL HIGH (ref 70–99)
Glucose-Capillary: 178 mg/dL — ABNORMAL HIGH (ref 70–99)
Glucose-Capillary: 128 mg/dL — ABNORMAL HIGH (ref 70–99)

## 2021-11-27 NOTE — Progress Notes (Signed)
Physical Therapy Session Note  Patient Details  Name: LACHELLE RISSLER MRN: [id number] Date of Birth: 02-15-1971  Today's Date: 11/27/2021 PT Individual Time: 3557-3220 and 2542-7062 PT Individual Time Calculation (min): 58 min and 31 min  Short Term Goals: Week 1:  PT Short Term Goal 1 (Week 1): = LTGs due to ELOS  Skilled Therapeutic Interventions/Progress Updates:  Patient in bathroom with NT on entrance to room. Husband present. Patient alert and agreeable to PT session.   Patient with no pain complaint throughout session.  Therapeutic Activity: Pt requests to get washed and dressed prior to session. Focus on using R hand/ UE and making safe choices re: balance and dressing options. Pt requires consistent multimodal cueing to use R hand to assist with manuevering clothing, dressing R side first. Pt also needs reminders to sit for improved balance rather than attempting to take underwear off while standing. Min/ ModA overall to complete dressing including bra.  Transfers: Patient performed sit<>stand and stand pivot transfers throughout session with supervision and improving hand placement to armrests prior to rise to stance as well as descent to sit. Provided min verbal cues for technique.  Gait Training:  Patient ambulated 200' x2 using RW with close supervision. Demonstrated very slow and consistent pace . Provided vc/ tc for turning head to R side in order to avoid obstacles.  Neuromuscular Re-ed: NMR facilitated during session with focus on standing and sitting balance, Rue awareness and fine motor pinch grip. Pt guided in ***. NMR performed for improvements in motor control and coordination, balance, sequencing, judgement, and self confidence/ efficacy in performing all aspects of mobility at highest level of independence.   Patient ***  in *** at end of session with brakes locked, *** alarm set, and all needs within reach.  Session 2: Patient *** in *** on entrance to room.  Patient alert and agreeable to PT session.   Patient with no pain complaint throughout session.  Therapeutic Activity: Bed Mobility: Patient performed supine <> sit with ***. VC/ tc required for ***. Transfers: Patient performed sit<>stand and stand pivot transfers throughout session with ***. Provided verbal cues for***.  Gait Training:  Patient ambulated *** ft using *** with ***. Demonstrated ***. Provided vc/ tc for ***.  Neuromuscular Re-ed: NMR facilitated during session with focus on***. Pt guided in ***. NMR performed for improvements in motor control and coordination, balance, sequencing, judgement, and self confidence/ efficacy in performing all aspects of mobility at highest level of independence.   Patient ***  in *** at end of session with brakes locked, *** alarm set, and all needs within reach.   Therapy Documentation Precautions:  Precautions Precautions: Fall Precaution Comments: right UE hemiparesis Restrictions Weight Bearing Restrictions: No General:   Vital Signs:   Pain:   Mobility:   Locomotion :    Trunk/Postural Assessment :    Balance:   Exercises:   Other Treatments:      Therapy/Group: Individual Therapy  Alger Simons PT, DPT, CSRS 11/27/2021, 10:19 AM

## 2021-11-27 NOTE — Patient Care Conference (Signed)
Inpatient RehabilitationTeam Conference and Plan of Care Update Date: 11/27/2021   Time: 12:13 PM    Patient Name: Hayley Bryant      Medical Record Number: [id number]  Date of Birth: 04-06-1971 Sex: Female         Room/Bed: 4M10C/4M10C-01 Payor Info: Payor: CIG[employer] / Plan: CIGNA MANAGED / Product Type: *No Product type* /    Admit Date/Time:  11/22/2021  2:42 PM  Primary Diagnosis:  Acute ischemic stroke [facility])  Hospital Problems: Principal Problem:   Acute ischemic stroke [name])    Expected Discharge Date: Expected Discharge Date: 12/06/21  Team Members Present: Physician leading conference: Dr. Jennye Boroughs Social Worker Present: Ovidio Kin, LCSW Nurse Present: Dorien Chihuahua, RN PT Present: Alden Hipp, PT OT Present: Meriel Pica, OT SLP Present: Charolett Bumpers, SLP PPS Coordinator present : Ileana Ladd, PT     Current Status/Progress Goal Weekly Team Focus  Bowel/Bladder     Continent of bowel and bladder        Swallow/Nutrition/ Hydration             ADL's   min A  supervision  balance, coordination, ADL training, functional mobility, pt/ fam education   Mobility   Bed mobility = Mod I/ supervision, Transfers = supervision/ CGA, ambulation = supervision/ CGA, stairs = CGA/ MInA  overall supervision  overall strengthening, R hemibody NMR, R awareness and increased use of RUE, standing balance/ tolerance, family education   Communication   Supervision-min A word finding/speech  Supervision A and Mod I  speech intelligibility, word finding, reading assessment   Safety/Cognition/ Behavioral Observations  Mod-min A  Supervision A  basic problem solving, error awareness and recall   Pain   Robaxin at HS   Pain managed with prns   Monitor pain and effectiveness of pain medication  Skin     N/A          Discharge Planning:  HOme with husband and son's all together will work on 24/7 plan-all work. Husband was here day of evaluation   Team  Discussion: Patient with flat affect/chorea hx. Post acute ischemic stroke with delayed thought processing, soft spoken with word finding deficits. With incoordination and gross/fine motor issues. Able to follow complex commands and working on coordination of breaths.  Patient on target to meet rehab goals: yes, currently needs Supervision - CGA for transfers due to right inattention and declines use of right UE for bathing and dressing. Ambulates with slow short steps.  *See Care Plan and progress notes for long and short-term goals.   Revisions to Treatment Plan:  N/a   Teaching Needs: Safety, transfers, medications, dietary modifications, etc  Current Barriers to Discharge: Decreased caregiver support and Home enviroment access/layout  Possible Resolutions to Barriers: Family education     Medical Summary Current Status: left basal ganglia infarct, HTN, DM, depression, anemia  Barriers to Discharge: Home enviroment access/layout;Medical stability  Barriers to Discharge Comments: left basal ganglia infarct, HTN, DM, depression, anemia Possible Resolutions to [employer] Focus: monitor CBG< follow BP, follow mood and antidepressant med, follow CBC   Continued Need for Acute Rehabilitation Level of Care: The patient requires daily medical management by a physician with specialized training in physical medicine and rehabilitation for the following reasons: Direction of a multidisciplinary physical rehabilitation program to maximize functional independence : Yes Medical management of patient stability for increased activity during participation in an intensive rehabilitation regime.: Yes Analysis of laboratory values and/or radiology reports with any subsequent need  for medication adjustment and/or medical intervention. : Yes   I attest that I was present, lead the team conference, and concur with the assessment and plan of the team.   Margarito Liner 11/27/2021, 3:14 PM

## 2021-11-27 NOTE — Progress Notes (Signed)
Speech Language Pathology Daily Session Note  Patient Details  Name: Hayley Bryant MRN: [id number] Date of Birth: Dec 27, 1970  Today's Date: 11/27/2021 SLP Individual Time: 1050-1130 SLP Individual Time Calculation (min): 40 min  Short Term Goals: Week 1: SLP Short Term Goal 1 (Week 1): Pt will utilize increased vocal intensity to achieve intelligibility at the phrase level with min assist. SLP Short Term Goal 2 (Week 1): Pt will utilize compensatory word finding strategies during functional communication with caregivers with supervision. SLP Short Term Goal 3 (Week 1): Pt will utilize external aids to facilitate recall of daily information with min assist.  Skilled Therapeutic Interventions:Skilled ST services focused on language skills. [name] was present and SLP provided education on semantic feature analysis and homework of categorization to aid in word finding strategies. SLP facilitated assessment of reading skills utilizing subsections of RCAB, pt demonstrated 80% accuracy on synonyms, 90% accuracy on semantic confusion and 50% accuracy on functional reading due to the complexity of the commands and reading acuity. Pt was left in room with call bell within reach and chair alarm set. SLP recommends to continue skilled services     Pain Pain Assessment Pain Score: 0-No pain  Therapy/Group: Individual Therapy  Riko Lumsden  [facility] 11/27/2021, 2:33 PM

## 2021-11-27 NOTE — Progress Notes (Signed)
Occupational Therapy Session Note  Patient Details  Name: Hayley Bryant MRN: [id number] Date of Birth: Sep 07, 1970  Today's Date: 11/27/2021 OT Individual Time: 0930-1030 OT Individual Time Calculation (min): 60 min    Short Term Goals: Week 1:  OT Short Term Goal 1 (Week 1): Pt will completed donning a bra and pullover shirt with no more than min assist on two consecutive occasions. OT Short Term Goal 2 (Week 1): Pt will complete LB dressing with min guard assist sit to stand for two consecutive sessions. OT Short Term Goal 3 (Week 1): Pt will use the RUE as an active assist for bathing and dressing tasks when washing the LUE or pulling up garments with min assist overall. OT Short Term Goal 4 (Week 1): Pt will complete toilet transfer at supervision level without an assistive device to regular toilet.  Skilled Therapeutic Interventions/Progress Updates:    Pt received in recliner with her daughters present. Pt declined a shower stating she got washed up already with PT.  Pt stood with close S and then ambulated with min to CGA with RW to orthogym with occasional cues for navigating to increase her awareness of R side with passing obstacles.   In gym pt worked in both sitting and standing on R shoulder AROM exercises using hula hoop to rotate her arms side to side around and in front of body. Standing at mirror, used R hand to clean mirror to reach above shoulder height.   Sit to stands with hands clasped in front for forced use of LEs.   [place] with in hand manipulation of picking up poker chips, squeezing clothespin, washcloth towel squeezes and individual finger extension.   Pt continues to need cues to use R hand in bimanual tasks and needs strengthening to increase mobility of hand.   Pt ambulated back to room and sat in recliner with alarm belt on and daughters present.   Therapy Documentation Precautions:  Precautions Precautions: Fall Precaution Comments: right UE  hemiparesis Restrictions Weight Bearing Restrictions: No Pain:  No c/o pain  ADL: ADL Eating: Set up Where Assessed-Eating: Chair Grooming: Supervision/safety Upper Body Bathing: Minimal cueing Where Assessed-Upper Body Bathing: Shower Lower Body Bathing: Minimal assistance Where Assessed-Lower Body Bathing: Shower Upper Body Dressing: Moderate assistance Where Assessed-Upper Body Dressing: Chair Lower Body Dressing: Moderate assistance Toileting: Moderate assistance Where Assessed-Toileting: Bedside Commode Toilet Transfer: Minimal assistance Toilet Transfer Method: Counselling psychologist: Geophysical data processor: Minimal assistance Social research officer, government Method: Heritage manager: Shower seat with back, Grab bars  Therapy/Group: Individual Therapy  [place] 11/27/2021, 8:30 AM

## 2021-11-27 NOTE — Progress Notes (Signed)
PROGRESS NOTE   Subjective/Complaints:  Pt in Bed this AM. Denies pain, constipation.  ROS: Negative for palpitations, CP, SOB, N/V/C/D. + weakness and myalgia  Objective:   No results found. No results for input(s): "WBC", "HGB", "HCT", "PLT" in the last 72 hours.  No results for input(s): "NA", "K", "CL", "CO2", "GLUCOSE", "BUN", "CREATININE", "CALCIUM" in the last 72 hours.   Intake/Output Summary (Last 24 hours) at 11/27/2021 0904 Last data filed at 11/27/2021 7588 Gross per 24 hour  Intake 597 ml  Output --  Net 597 ml         Physical Exam: Vital Signs Blood pressure 121/78, pulse 66, temperature 98.2 F (36.8 C), resp. rate 14, height '5\' 7"'$  (1.702 m), weight 74.1 kg, SpO2 100 %.    General: Alert and oriented x 3, No apparent distress HEENT: Head is normocephalic, atraumatic, PERRLA, EOMI, sclera anicteric, oral mucosa pink and moist Neck: Supple  Heart: Reg rate and rhythm. No murmurs rubs or gallops Chest: CTA bilaterally without wheezes, rales, or rhonchi; good air movement Abdomen: Soft, non-tender, non-distended, bowel sounds positive. Extremities: No clubbing, cyanosis, or edema. Pulses are 2+ Psych: Pt's affect is flat. Pt is cooperative Skin: Clean and intact without signs of breakdown,warm and dry Neuro:  Sensation intact to LT.  Mod dysarthria but understandable speech.  Musculoskeletal: Strength 4/5 left side, 3/5 right side.   Assessment/Plan: 1. Functional deficits which require 3+ hours per day of interdisciplinary therapy in a comprehensive inpatient rehab setting. Physiatrist is providing close team supervision and 24 hour management of active medical problems listed below. Physiatrist and rehab team continue to assess barriers to discharge/monitor patient progress toward functional and medical goals  Care Tool:  Bathing    Body parts bathed by patient: Chest, Abdomen, Front perineal  area, Buttocks, Face, Left lower leg, Right lower leg, Left upper leg, Right upper leg, Right arm   Body parts bathed by helper: Left arm     Bathing assist Assist Level: Minimal Assistance - Patient > 75%     Upper Body Dressing/Undressing Upper body dressing   What is the patient wearing?: Pull over shirt, Bra    Upper body assist Assist Level: Moderate Assistance - Patient 50 - 74%    Lower Body Dressing/Undressing Lower body dressing      What is the patient wearing?: Underwear/pull up, Pants     Lower body assist Assist for lower body dressing: Moderate Assistance - Patient 50 - 74%     Toileting Toileting    Toileting assist Assist for toileting: Minimal Assistance - Patient > 75%     Transfers Chair/bed transfer  Transfers assist     Chair/bed transfer assist level: Contact Guard/Touching assist     Locomotion Ambulation   Ambulation assist      Assist level: Contact Guard/Touching assist Assistive device: No Device Max distance: 190   Walk 10 feet activity   Assist     Assist level: Contact Guard/Touching assist Assistive device: No Device   Walk 50 feet activity   Assist    Assist level: Contact Guard/Touching assist Assistive device: No Device    Walk 150 feet activity  Assist    Assist level: Contact Guard/Touching assist Assistive device: No Device    Walk 10 feet on uneven surface  activity   Assist     Assist level: Minimal Assistance - Patient > 75% Assistive device: Hand held assist   Wheelchair     Assist Is the patient using a wheelchair?: Yes Type of Wheelchair: Manual    Wheelchair assist level: Maximal Assistance - Patient 25 - 49% Max wheelchair distance: 50    Wheelchair 50 feet with 2 turns activity    Assist        Assist Level: Maximal Assistance - Patient 25 - 49%   Wheelchair 150 feet activity     Assist  Wheelchair 150 feet activity did not occur:  (R hand  inattention)   Assist Level: Maximal Assistance - Patient 25 - 49% (based on PT eval documentation)   Blood pressure 121/78, pulse 66, temperature 98.2 F (36.8 C), resp. rate 14, height '5\' 7"'$  (1.702 m), weight 74.1 kg, SpO2 100 %.  Medical Problem List and Plan: 1. Functional deficits secondary to left basal ganglia infarct             -patient may shower             -ELOS/Goals: 7-9 days S             -Continue Cir with PT, OT, SLP  -Team conference today 2.  Antithrombotics: -DVT/anticoagulation:  Pharmaceutical: Heparin             -antiplatelet therapy: Aspirin 81 mg daily and Plavix 75 mg daily for 3 weeks then aspirin alone 3. Pain Management: Tylenol 6/24 Pain reported in rt knee today.  Continue use of Tylenol. 6/25 No pain reported this morning. 4. Mood/Sleep: LCSW to evaluate and provide emotional support             -antipsychotic agents: n/a 5. Neuropsych/cognition: This patient is not capable of making decisions on her own behalf. 6. Skin/Wound Care: Routine skin care checks 7. Fluids/Electrolytes/Nutrition: Routine I's and O's and follow-up chemistries 8. Hypertension: Continue lisinopril/HCTZ 6/28 well controlled    11/27/2021    4:21 AM 11/26/2021    7:32 PM 11/26/2021    1:10 PM  Vitals with BMI  Systolic [phone]  Diastolic 78 76 70  Pulse 66 72 87    9. Depression continue fluoxetine 60 mg daily Mood reported to be stable, continue fluoxetine '60mg'$  daily 10. Diabetes mellitus-2: A1c 7.7; monitor CBGs             -- SSI before every meal and nightly             -- Continue Semglee 17 units daily  -Home meds include Jardiance and trulicity 1/63 CBG range 167-236, past 24 hrs. 6/25 Continue Semglee 17 U daily. CBG (last 3)  6/26 Will restart home jardiance '10mg'$  Recent Labs    11/26/21 1659 11/26/21 2058 11/27/21 0654  GLUCAP 153* 132* 98   6/28 CBG continues with good control, hold trulicity for now 11. Hyperlipidemia: Continue atorvastatin 40 mg  daily,counseling on healthy diet 12.Anemia: Repeat hgb tomorrow.  6/25 Hgb stable at 9.6, trend with weekly labs.  13. Hypoalbuminemia -mild   -monitor nutrition, she appears to be eating most her meals LOS: 5 days A FACE TO FACE EVALUATION WAS PERFORMED  Jennye Boroughs 11/27/2021, 9:04 AM

## 2021-11-27 NOTE — Progress Notes (Signed)
Patient ID: Hayley Bryant, female   DOB: 1971/05/11, 51 y.o.   MRN: [id number]  Met with pt to give her team conference update regarding goals of supervision level and target discharge date of 7/7. She feels she is doing better and says would have transport to OP therapies if needed. Have attempted to contact husband and not able to leave a message no voice mail set up. Pt aware did e-mail the MD letter to her human resources department-Lauren Nori Riis. Will continue to try to reach husband.

## 2021-11-28 LAB — GLUCOSE, CAPILLARY
Glucose-Capillary: 92 mg/dL (ref 70–99)
Glucose-Capillary: 119 mg/dL — ABNORMAL HIGH (ref 70–99)
Glucose-Capillary: 126 mg/dL — ABNORMAL HIGH (ref 70–99)
Glucose-Capillary: 127 mg/dL — ABNORMAL HIGH (ref 70–99)

## 2021-11-28 NOTE — Progress Notes (Signed)
Occupational Therapy Session Note  Patient Details  Name: Hayley Bryant MRN: [id number] Date of Birth: 09/10/1970  Today's Date: 11/28/2021 OT Individual Time: 1045-1200 OT Individual Time Calculation (min): 75 min    Short Term Goals: Week 1:  OT Short Term Goal 1 (Week 1): Pt will completed donning a bra and pullover shirt with no more than min assist on two consecutive occasions. OT Short Term Goal 2 (Week 1): Pt will complete LB dressing with min guard assist sit to stand for two consecutive sessions. OT Short Term Goal 3 (Week 1): Pt will use the RUE as an active assist for bathing and dressing tasks when washing the LUE or pulling up garments with min assist overall. OT Short Term Goal 4 (Week 1): Pt will complete toilet transfer at supervision level without an assistive device to regular toilet. Week 2:     Skilled Therapeutic Interventions/Progress Updates:    Patient seen this AM for OT services, the pt completed sit to stands using the RW with CGA and verbal prompts for safe task performance and consistency in technique.  The pt completed the sit to stands 3X for effective carryover. The pt was able to complete a stand pivot transfer for toileting using the RW with MinA , the pt presented as [place] for toileting inclusive of doffing and donning her LB garments, under wear and pants.  The pt transferred back to the recliner and completed a simulated task in UB/LB dressing using medium grade theraband with initial verbal cue and demonstration to reinforce approaching the task using the hemi side to donn a clothing item and and full functioning side to doff the clothing item.  The pt was able to donn and doff the theraband for UB performance with the initial prompt and  demonstration, but required ModA for effective carryover for LB task performance.  The pt completed the OT session performing NMR of the RUE by completing weight bearing exercise in standing to improve the communication to the  extremity.  The pt also completed BUE exercises using a 2lb dumb bell 2 set of 10 for bicep curls, shld flexion, and horizontal abduction with rest breaks as needed, the pt required 2 rest break between sets. The pt  presented with dizziness when coming from sit to stand and was encouraged to return to sit, she was able to  resume OT upon the symptoms diminishing within 5 minutes. The pt returned to the recliner with her bed side table and call light within reach, her chair alarm was in place and activated.  The pt had no c/o pain during this OT session.   Therapy Documentation Precautions:  Precautions Precautions: Fall Precaution Comments: right UE hemiparesis Restrictions Weight Bearing Restrictions: No  Therapy/Group: Individual Therapy  Yvonne Kendall 11/28/2021, 12:32 PM

## 2021-11-28 NOTE — Progress Notes (Signed)
Physical Therapy Session Note  Patient Details  Name: Hayley Bryant MRN: [id number] Date of Birth: November 03, 1970  Today's Date: 11/28/2021 PT Individual Time: 0916-1028 PT Individual Time Calculation (min): 72 min   Short Term Goals: Week 1:  PT Short Term Goal 1 (Week 1): = LTGs due to ELOS Week 2:    Week 3:     Skilled Therapeutic Interventions/Progress Updates:    PAIN denies pain   Therapy Documentation Precautions:  Precautions Precautions: Fall Precaution Comments: right UE hemiparesis Restrictions Weight Bearing Restrictions: No General:  Pt initially supine.   Supine to sit w/supervision. Short distance gait to sink w/cga, no ad, slow cadence, small steps, crouched posture. Pt stood at sink w/cga and performed limited bathing w/set up, cues to encourage use of RUE during task. Gait to bed w/cga as above. Pt engaged in dressing w/max assist for bra, set up and cues to encourage RUE use/attention to completion of task R hemibody, additional time, light min assist sitting/standing.  Gait >175f w/RW, cga, occasional cues to attend to R environment.  Standing balance/RUE coordination/R attention task. In standing w/RW pt engaged in reaching out of base of support to table at post/R to pick up beanbags w/R hand then toss to target ant R.  Required seated rest x 4-5 reps but completes 12 total, cga for balance, difficulty coordinating toss/release w/RUE.  Repeated task but now passing beanbags from table to R to drop in bucket on floor to L requiring crossbody movement.  Completes 12 without seated rest. Cga only  Stairs: Pt ascended/descended 12 steps w/2 rails (4x3)w/step over step gait and cues to attend to progression/positioning of RUE on handrail.  Performs w/cga but does develop mild wobbles R knee/hip w/fatigue on last pass of descending 4 steps.   Repeated x 8 steps for functional strengthening task.  Standing without Ad worked on alternating long stride towards  cone/target to promote increased step length/stability w/longer strides. Decreased length achieved R vs L but improves w/repetition.  Gait x 2058fwithout ad w/cues to attend to stride w/much improved gait pattern, reverts to less efficient pattern and requires occasional reminders to resume increased stride.  Pt left oob in recliner w/chair alarm set and needs in reach.    Therapy/Group: Individual Therapy .......Marland KitchenMarland KitchenCallie FieldingPT   BaJerrilyn Cairo/29/2023, 10:30 AM

## 2021-11-28 NOTE — Progress Notes (Signed)
Speech Language Pathology Daily Session Note  Patient Details  Name: Hayley Bryant MRN: [id number] Date of Birth: [birth year]  Today's Date: 11/28/2021 SLP Individual Time: 0820-0900 Total Time: 40 minutes   Short Term Goals: Week 1: SLP Short Term Goal 1 (Week 1): Pt will utilize increased vocal intensity to achieve intelligibility at the phrase level with min assist. - Pt demonstrated increased vocal intensity to achieve 90% intelligibility at the word and short phrase level with Mod-Max A multimodal cues for consistent implementation.    SLP Short Term Goal 2 (Week 1): Pt will utilize compensatory word finding strategies during functional communication with caregivers with supervision. - Did not formally address this session.   SLP Short Term Goal 3 (Week 1): Pt will utilize external aids to facilitate recall of daily information with min assist.- Pt utilized external aid (e.g. whiteboard) to recall focus of today's session on vocal intensity rating scale (1-5) and compensatory speech intelligibility strategies given Mod-Max A.  Skilled Therapeutic Interventions: S: Pt seen this date for skilled ST intervention targeting speech intelligibility goals outlined above. Pt received awake/alert and sitting upright in bed. Spouse present for the first half of today's session. Decreased intelligibility noted at onset of today's session with SLP requesting repetition of verbal communication; decreased awareness of communication breakdown noted. No c/o pain. Agreeable to ST intervention at bedside.  O: Please see above for objective data collected re: pt's performance on targeted goals. Education ongoing re: implementation and recall of compensatory speech intelligibility strategies, vocal intensity rating scale (1-5; 1= whisper, 5 = shouting), environmental modifications, mass practice, and biofeedback with use of audio recording to help with self-monitoring and awareness of diminished comprehensibility  to communication partner. With use of biofeedback interventions and overall Mod-Max A multimodal cues, pt successfully increased her intelligibility of single words and short phrases with initial "ch," "j," "m," and "d" sounds to 90%. At the conclusion of today's session, pt requesting a list of words to practice her strategies outside of session. Spouse present for a portion of today's session and encouraged to practice strategies with pt outside of therapy; visual support left on whiteboard with pt and pt's spouse verbalizing understanding.   A: Pt remains stimulable for skilled ST intervention targeting speech intelligibility, as evident by objective progress with implementation of biofeedback, Max A multimodal cues, mass practice, and implementation of speech intelligibility strategies.  P: Pt left in bed with all safety measures activated. Call bell reviewed and within reach and all immediate needs met. Continue per current ST POC.  Pain Denies pain; NAD  Therapy/Group: Individual Therapy  Rosetta Rupnow A Marenda Accardi 11/28/2021, 1:23 PM

## 2021-11-28 NOTE — Progress Notes (Signed)
PROGRESS NOTE   Subjective/Complaints:  Pt in bed, reports she slept well. No new concerns.  ROS: Negative for palpitations, CP, palpitations, SOB, N/V/C/D. + weakness and myalgia  Objective:   No results found. No results for input(s): "WBC", "HGB", "HCT", "PLT" in the last 72 hours.  No results for input(s): "NA", "K", "CL", "CO2", "GLUCOSE", "BUN", "CREATININE", "CALCIUM" in the last 72 hours.   Intake/Output Summary (Last 24 hours) at 11/28/2021 0802 Last data filed at 11/27/2021 1907 Gross per 24 hour  Intake 351 ml  Output --  Net 351 ml         Physical Exam: Vital Signs Blood pressure 121/75, pulse 62, temperature 97.8 F (36.6 C), temperature source Oral, resp. rate 18, height '5\' 7"'$  (1.702 m), weight 74.1 kg, SpO2 100 %.    General: Alert and oriented x 3, No apparent distress HEENT: Head is normocephalic, atraumatic, PERRLA, EOMI, sclera anicteric, oral mucosa pink and moist,conjugate gaze Neck: Supple  Heart: Reg rate and rhythm. No murmurs rubs or gallops Chest: CTA bilaterally without wheezes, rales, or rhonchi; good air movement Abdomen: Soft, non-tender, non-distended, bowel sounds positive. Extremities: No clubbing, cyanosis, or edema. Pulses are 2+ Psych: Pt's affect is flat. Pt is cooperative Skin: Clean and intact without signs of breakdown,warm and dry Neuro:  Sensation intact to LT.  Mod dysarthria but understandable speech.  Musculoskeletal: Strength 4/5 left side, 3/5 right side.   Assessment/Plan: 1. Functional deficits which require 3+ hours per day of interdisciplinary therapy in a comprehensive inpatient rehab setting. Physiatrist is providing close team supervision and 24 hour management of active medical problems listed below. Physiatrist and rehab team continue to assess barriers to discharge/monitor patient progress toward functional and medical goals  Care Tool:  Bathing     Body parts bathed by patient: Chest, Abdomen, Front perineal area, Buttocks, Face, Left lower leg, Right lower leg, Left upper leg, Right upper leg, Right arm   Body parts bathed by helper: Left arm     Bathing assist Assist Level: Minimal Assistance - Patient > 75%     Upper Body Dressing/Undressing Upper body dressing   What is the patient wearing?: Pull over shirt, Bra    Upper body assist Assist Level: Moderate Assistance - Patient 50 - 74%    Lower Body Dressing/Undressing Lower body dressing      What is the patient wearing?: Underwear/pull up, Pants     Lower body assist Assist for lower body dressing: Moderate Assistance - Patient 50 - 74%     Toileting Toileting    Toileting assist Assist for toileting: Minimal Assistance - Patient > 75%     Transfers Chair/bed transfer  Transfers assist     Chair/bed transfer assist level: Supervision/Verbal cueing     Locomotion Ambulation   Ambulation assist      Assist level: Supervision/Verbal cueing Assistive device: Walker-rolling Max distance: 190   Walk 10 feet activity   Assist     Assist level: Contact Guard/Touching assist Assistive device: No Device   Walk 50 feet activity   Assist    Assist level: Supervision/Verbal cueing Assistive device: Walker-rolling    Walk 150 feet activity  Assist    Assist level: Supervision/Verbal cueing Assistive device: Walker-rolling    Walk 10 feet on uneven surface  activity   Assist     Assist level: Minimal Assistance - Patient > 75% Assistive device: Hand held assist   Wheelchair     Assist Is the patient using a wheelchair?: Yes Type of Wheelchair: Manual    Wheelchair assist level: Maximal Assistance - Patient 25 - 49% Max wheelchair distance: 50    Wheelchair 50 feet with 2 turns activity    Assist        Assist Level: Maximal Assistance - Patient 25 - 49%   Wheelchair 150 feet activity     Assist   Wheelchair 150 feet activity did not occur:  (R hand inattention)   Assist Level: Maximal Assistance - Patient 25 - 49% (based on PT eval documentation)   Blood pressure 121/75, pulse 62, temperature 97.8 F (36.6 C), temperature source Oral, resp. rate 18, height '5\' 7"'$  (1.702 m), weight 74.1 kg, SpO2 100 %.  Medical Problem List and Plan: 1. Functional deficits secondary to left basal ganglia infarct             -patient may shower             -ELOS/Goals: 7/7 S             -Continue CIR with PT, OT, SLP 2.  Antithrombotics: -DVT/anticoagulation:  Pharmaceutical: Heparin             -antiplatelet therapy: Aspirin 81 mg daily and Plavix 75 mg daily for 3 weeks then aspirin alone 3. Pain Management: Tylenol 6/24 Pain reported in rt knee today.  Continue use of Tylenol. 6/25 No pain reported this morning. 4. Mood/Sleep: LCSW to evaluate and provide emotional support             -antipsychotic agents: n/a 5. Neuropsych/cognition: This patient is not capable of making decisions on her own behalf. 6. Skin/Wound Care: Routine skin care checks 7. Fluids/Electrolytes/Nutrition: Routine I's and O's and follow-up chemistries 8. Hypertension: Continue lisinopril/HCTZ 6/29 BP well controlled, continue lisinopril and HCTZ    11/28/2021    5:19 AM 11/27/2021    8:10 PM 11/27/2021    1:36 PM  Vitals with BMI  Systolic 462 703 500  Diastolic 75 84 74  Pulse 62 76 80    9. Depression continue fluoxetine 60 mg daily Mood reported to be stable, continue fluoxetine '60mg'$  daily 10. Diabetes mellitus-2: A1c 7.7; monitor CBGs             -- SSI before every meal and nightly             -- Continue Semglee 17 units daily  -Home meds include Jardiance and trulicity 9/38 Will restart home jardiance '10mg'$  Recent Labs    11/27/21 1608 11/27/21 2057 11/28/21 0537  GLUCAP 178* 128* 92   6/29 Well controlled overall, continue current regimen 11. Hyperlipidemia: Continue atorvastatin 40 mg  daily,counseling on healthy diet 12.Anemia: Repeat hgb tomorrow.  6/25 Hgb stable at 9.6, trend with weekly labs.  13. Hypoalbuminemia -mild   -eating 95-100% of last few meals LOS: 6 days A FACE TO FACE EVALUATION WAS PERFORMED  Jennye Boroughs 11/28/2021, 8:02 AM

## 2021-11-28 NOTE — Progress Notes (Signed)
Patient ID: Hayley Bryant, female   DOB: 04/29/1971, 51 y.o.   MRN: [id number]  Met with pt and husband who was still here from staying the night to update him regarding team conference. He can see wife's progress and is aware of the discharge date of 7/7 and recommendations of 24/7 supervision. Gave him the letter for [employer] that has been e-mailed. He may have to take it to wife's work if still needing it.

## 2021-11-28 NOTE — Progress Notes (Signed)
Physical Therapy Session Note  Patient Details  Name: Hayley Bryant MRN: [id number] Date of Birth: 12/12/1970  Today's Date: 11/28/2021 PT Individual Time: 7616-0737 PT Individual Time Calculation (min): 53 min   Short Term Goals: Week 1:  PT Short Term Goal 1 (Week 1): = LTGs due to ELOS  Skilled Therapeutic Interventions/Progress Updates:    Patient in recliner in room and reports no pain.  Able to recollect events of prior to PT session negotiating steps.  Sit to stand with S and UE support to RW.  Ambulated x 30' with RW and CGA and pt reports not using walker at times so walked to therapy gym no AD with CGA and occasional cues/encouragement for longer steps.  Patient negotiated flight of steps in stairwell with L then R rail as reports not sure which side rail at home.  Patient needing HHA with min A to negotiate steps with one rail and cues for foot position on R on step for safety with step through technique to ascend and step to versus occasional step through to descend. Patient relates would be better wearing shoes.  Patient needing min A for balance ambulating without walker after stair negotiation reporting fatigue.  Returned to mat to sit to rest.  Patient performed gait on mat to simulate carpet and to work on balance.  Min A no device versus hand hold A for stepping up on to mat and walking across, then stepping over and back on line on mat then stepping over cones with HHA.  Requesting shoes so retrieved from her room and mod A for socks then shoes. Then work on balance with SLS activities knocking over cone and righting cone with one foot min a HHA. Patient seated on mat and discussed fall recovery including checking out for injury and calling for help, then demonstrated technique after scooting near supportive piece of furniture.  Patient able to repeat steps and discussed plans to practice prior to d/c.  Assisted to ambulate to room with RW per her request. Assisted to [facility] and minA for  balance throughout.  Pt to supine with CGA and cues for positioning.  Left in supine with needs in reach and bed alarm active.   Therapy Documentation Precautions:  Precautions Precautions: Fall Precaution Comments: right UE hemiparesis Restrictions Weight Bearing Restrictions: No    Pain: Pain Assessment Pain Score: 0-No pain    Therapy/Group: Individual Therapy  Reginia Naas Magda Kiel, PT 11/28/2021, 12:57 PM

## 2021-11-29 LAB — GLUCOSE, CAPILLARY
Glucose-Capillary: 84 mg/dL (ref 70–99)
Glucose-Capillary: 101 mg/dL — ABNORMAL HIGH (ref 70–99)
Glucose-Capillary: 106 mg/dL — ABNORMAL HIGH (ref 70–99)
Glucose-Capillary: 126 mg/dL — ABNORMAL HIGH (ref 70–99)

## 2021-11-29 NOTE — Progress Notes (Signed)
Occupational Therapy Session Note  Patient Details  Name: Hayley Bryant MRN: [id number] Date of Birth: 27-Jan-1971  Today's Date: 11/29/2021 OT Individual Time: 9518-8416 OT Individual Time Calculation (min): 62 min    Short Term Goals: Week 1:  OT Short Term Goal 1 (Week 1): Pt will completed donning a bra and pullover shirt with no more than min assist on two consecutive occasions. OT Short Term Goal 2 (Week 1): Pt will complete LB dressing with min guard assist sit to stand for two consecutive sessions. OT Short Term Goal 3 (Week 1): Pt will use the RUE as an active assist for bathing and dressing tasks when washing the LUE or pulling up garments with min assist overall. OT Short Term Goal 4 (Week 1): Pt will complete toilet transfer at supervision level without an assistive device to regular toilet.  Skilled Therapeutic Interventions/Progress Updates:    Pt awake sitting in recliner with son Hayley Bryant) present upon OT arrival to the room. Pt reports, "I'm doing good." Pt in agreement for OT session.  Pt able to ambulate from room > therapy gym without AD, CGA for safety, and moderate VC's to perform bigger and smooth steps. Once in the gym, pt participates in therapeutic exercise in order to improve motor control, fine motor coordination, strength, and dynamic standing balance. These skills are needed to improve independence and safety with ADLs and functional mobility within the home  environment. Pt able to ambulate back to room with FWW and close supervision for safety. Pt participates in toileting, toilet transfer, and hand hygiene prior to returning back to recliner.   Therapy Documentation Precautions:  Precautions Precautions: Fall Precaution Comments: right UE hemiparesis Restrictions Weight Bearing Restrictions: No   Vital Signs (last charted vitals per nursing staff): Therapy Vitals Temp: 98.8 F (37.1 C) Pulse Rate: 93 Resp: 17 BP: 105/71 Patient Position (if  appropriate): Sitting Oxygen Therapy SpO2: 100 % O2 Device: Room Air Pain: Pain Assessment Pain Scale: 0-10 Pain Score: 0-No pain ADL: Toileting: Minimal assistance (Pt able to participate in all portions of task, however, requires assistance to fully pull pants up over buttocks.) Where Assessed-Toileting: Glass blower/designer: Contact guard (Pt able to complete ambulatory transfer with CGA for safe descend onto toilet with use of FWW and grab bars.) Toilet Transfer Method: Ambulating  Pt able to complete ambulatory transfer to toilet with use of FWW, CGA , and use of grab bars for safety. Pt able to participate in all portions of toilet task, however, requires minimal assistance to fully pull pants up in the back. Pt was noted to have to excessively bend over to retreive clothing since they fell lower once standing. OT provided education on tools (elastic clip) that can attach to shirt and pants to keep clothing within a safe retrieval distance once standing. Pt able to ambulation from toilet > sink with close supervision with FWW. Pt performed hand hygiene standing at the sink and then returned back to recliner with close supervision and use of FWW for safety.    Balance: Pt completes therapeutic exercises (listed below) while standing in order to improve endurance and dynamic standing balance. Pt able to complete various UE exercises while standing with close supervision for safety. Pt requires frequent seated rest breaks after each exercise. Pt able to complete various sit <> stand transfers for rest breaks with close supervision while not using BUE to push up from surface to improve BLE and core strength during transfer. No LOB noted during task.  Exercises: Pt participated in repetitive bilateral coordination tasks to improve strength, endurance, and motor control. Pt able to complete 10 reps of the following exercises with BUE on a rod <1#: chest press, forward rowing, and reverse  rowing. Pt requires moderate VC's for big, smooth movements and to fully extend RUE to maximize strengthening benefits.    Other Treatments:  Family education provided to son on importance of family training. Pt reports that various family members will be providing assist as DC. Pt's primary concern with DC is ascending/descending stairs various times per day since bedroom is on the 2nd floor. OT encouraged pt to perform stairs during various therapy sessions and to have family participate in stair training to improve pt's comfort. Pt and son verbalized understanding.     Therapy/Group: Individual Therapy  Hayley Bryant 11/29/2021, 3:31 PM

## 2021-11-29 NOTE — Progress Notes (Signed)
Occupational Therapy Weekly Progress Note  Patient Details  Name: Hayley Bryant MRN: [id number] Date of Birth: 06/14/1970  Beginning of progress report period: November 23, 2021 End of progress report period: November 29, 2021   Patient has met 0 of 4 short term goals.  Pt is able to complete functional mobility with CGA - close supervision. Pt continues to demonstrate deficits with functional endurance, fine motor coordination/strength and general strength with RUE, and attention to R hand during functional tasks.   Patient continues to demonstrate the following deficits: muscle weakness and decreased problem solving and decreased safety awareness and therefore will continue to benefit from skilled OT intervention to enhance overall performance with BADL.  Patient progressing toward long term goals..  Continue plan of care.  OT Short Term Goals Week 1:  OT Short Term Goal 1 (Week 1): Pt will completed donning a bra and pullover shirt with no more than min assist on two consecutive occasions. OT Short Term Goal 1 - Progress (Week 1): Progressing toward goal OT Short Term Goal 2 (Week 1): Pt will complete LB dressing with min guard assist sit to stand for two consecutive sessions. OT Short Term Goal 2 - Progress (Week 1): Progressing toward goal OT Short Term Goal 3 (Week 1): Pt will use the RUE as an active assist for bathing and dressing tasks when washing the LUE or pulling up garments with min assist overall. OT Short Term Goal 3 - Progress (Week 1): Progressing toward goal OT Short Term Goal 4 (Week 1): Pt will complete toilet transfer at supervision level without an assistive device to regular toilet. OT Short Term Goal 4 - Progress (Week 1): Progressing toward goal Week 2:  OT Short Term Goal 1 (Week 2): STG=LTG  Skilled Therapeutic Interventions/Progress Updates:  Balance/vestibular training;Cognitive remediation/compensation;Discharge planning;Community reintegration;Disease  mangement/prevention;DME/adaptive equipment instruction;Functional mobility training;Functional electrical stimulation;Neuromuscular re-education;Pain management;Psychosocial support;Patient/family education;Self Care/advanced ADL retraining;Therapeutic Activities;UE/LE Strength taining/ROM;UE/LE Coordination activities;Splinting/orthotics;Therapeutic Exercise   Therapy Documentation See daily note for today's therapy session.    Therapy/Group: Individual Therapy  Barbee Shropshire 11/29/2021, 4:44 PM

## 2021-11-29 NOTE — Progress Notes (Signed)
Physical Therapy Session Note  Patient Details  Name: Hayley Bryant MRN: [id number] Date of Birth: 05/24/1971  Today's Date: 11/29/2021 PT Individual Time: 0930-1015 PT Individual Time Calculation (min): 45 min   Short Term Goals: Week 1:  PT Short Term Goal 1 (Week 1): = LTGs due to ELOS  Skilled Therapeutic Interventions/Progress Updates:    pt received in bed and agreeable to therapy. No complaint of pain. Bed mobility with supervision. Donned shoes and socks tot A for time. Pt ambulated 4 x 120-150 ft with RW, requiring seated rest breaks after gait. CGA fading to supervision with RW. Pt demoes short step length and height, improves with cueing.  Pt will abandon RW at times if not cued, CGA without AD. Pt then navigated hospital stair well with min A for balance and knee block, R hand rail, self selected alternating pattern while ascending, demoes incr fatigue when stepping up with RLE.  Pt required seated rest break before descending with step to pattern. On returning to room, pt requested to brush teeth. Pt required assist for putting toothpaste on toothbrush, otherwise supervision for balance and oral care tasks. Pt returned to recliner after session and was left with all needs in reach and alarm active.   Therapy Documentation Precautions:  Precautions Precautions: Fall Precaution Comments: right UE hemiparesis Restrictions Weight Bearing Restrictions: No General:       Therapy/Group: Individual Therapy  Mickel Fuchs 11/29/2021, 10:04 AM

## 2021-11-29 NOTE — Progress Notes (Signed)
Speech Language Pathology Weekly Progress and Session Note  Patient Details  Name: Hayley Bryant MRN: [id number] Date of Birth: [birth year]  Beginning of progress report period: November 23, 2021 End of progress report period: November 29, 2021  Today's Date: 11/29/2021 SLP Individual Time: 1300-1400 SLP Individual Time Calculation (min): 60 min  Short Term Goals: Week 1: SLP Short Term Goal 1 (Week 1): Pt will utilize increased vocal intensity to achieve intelligibility at the phrase level with min assist. SLP Short Term Goal 1 - Progress (Week 1): Not met SLP Short Term Goal 2 (Week 1): Pt will utilize compensatory word finding strategies during functional communication with caregivers with supervision. SLP Short Term Goal 2 - Progress (Week 1): Not met SLP Short Term Goal 3 (Week 1): Pt will utilize external aids to facilitate recall of daily information with min assist. SLP Short Term Goal 3 - Progress (Week 1): Met    New Short Term Goals: Week 2: SLP Short Term Goal 1 (Week 2): STG = LTG d/t ELOS  Weekly Progress Updates: Pt has made slow progress this week meeting 1 out of 3 STGs. Pt met goal for memory and is able to recall daily information with overall min A and benefits from use of memory book in room. Pt remains with dysarthric speech and requiring mod-max A cues to utilize increase vocal intensity to increase intelligibility at phrase level. Pt continues with word finding difficulties and is utilizing compensatory strategies as trained with overall Min A verbal cues. Pt's family has been present during many tx sessions, ask appropriate questions and cueing patient as needed to increase word finding and vocal intensity. Continue to recommend 24/7 supervision at discharge with ongoing ST services.   Intensity: Minumum of 1-2 x/day, 30 to 90 minutes Frequency: 3 to 5 out of 7 days Duration/Length of Stay: 7/7 Treatment/Interventions: Cognitive remediation/compensation;Cueing  hierarchy;Internal/external aids;Environmental controls;Patient/family education;Speech/Language facilitation  Daily Session  Skilled Therapeutic Interventions: Pt seen for skilled ST with focus on speech, language and cognitive goals, pt upright in recliner with son and daughter present. Pt able to recall information from previous ST session and therapies earlier this AM with Supervision A including doing hospital stairs which she was very proud of. SLP facilitating word finding task targeting concrete and abstract words by providing mod A cues. Pt benefits from encouragement to participate at times, seemed to "give up" and want help from therapist or family. Family helpful in encouraging patient to complete tasks independently. Pt requiring overall mod A cues to utilize speech intelligibility strategies throughout session, primarily breath support and increased vocal intensity (4 or 5 out of 5 volume). At end of session, pt reporting significant cognitive fatigue, left in recliner with alarm belt present and son present for needs. Cont ST POC.     General    Pain Pain Assessment Pain Scale: 0-10 Pain Score: 0-No pain  Therapy/Group: Individual Therapy  Dewaine Conger 11/29/2021, 2:02 PM

## 2021-11-29 NOTE — Progress Notes (Signed)
PROGRESS NOTE   Subjective/Complaints:  Pt sleeping in bed initially, no new concerns or complaints this AM.   ROS: Negative for palpitations,HA, CP, palpitations, SOB, N/V/C/D. + weakness and myalgia  Objective:   No results found. No results for input(s): "WBC", "HGB", "HCT", "PLT" in the last 72 hours.  No results for input(s): "NA", "K", "CL", "CO2", "GLUCOSE", "BUN", "CREATININE", "CALCIUM" in the last 72 hours.   Intake/Output Summary (Last 24 hours) at 11/29/2021 0803 Last data filed at 11/28/2021 1752 Gross per 24 hour  Intake 240 ml  Output --  Net 240 ml         Physical Exam: Vital Signs Blood pressure 120/76, pulse 73, temperature 98.1 F (36.7 C), resp. rate 18, height '5\' 7"'$  (1.702 m), weight 74.1 kg, SpO2 100 %.    General: Alert and oriented x 3, No apparent distress HEENT: Head is normocephalic, atraumatic, PERRLA, EOMI, sclera anicteric, oral mucosa pink and moist,conjugate gaze Neck: Supple  Heart: Reg rate and rhythm. No murmurs rubs or gallops Chest: CTA bilaterally without wheezes, rales, or rhonchi; good air movement Abdomen: Soft, non-tender, non-distended, bowel sounds positive. Extremities: No clubbing, cyanosis, or edema. Pulses are 2+ Psych: Pt's affect is normal. Pt is cooperative Skin: Clean and intact without signs of breakdown,warm and dry Neuro:  Sensation intact to LT.  Mod dysarthria but understandable speech.  Musculoskeletal: Strength 4/5 left side, 3/5 right side.   Assessment/Plan: 1. Functional deficits which require 3+ hours per day of interdisciplinary therapy in a comprehensive inpatient rehab setting. Physiatrist is providing close team supervision and 24 hour management of active medical problems listed below. Physiatrist and rehab team continue to assess barriers to discharge/monitor patient progress toward functional and medical goals  Care Tool:  Bathing     Body parts bathed by patient: Chest, Abdomen, Front perineal area, Buttocks, Face, Left lower leg, Right lower leg, Left upper leg, Right upper leg, Right arm   Body parts bathed by helper: Left arm     Bathing assist Assist Level: Minimal Assistance - Patient > 75%     Upper Body Dressing/Undressing Upper body dressing   What is the patient wearing?: Pull over shirt, Bra    Upper body assist Assist Level: Moderate Assistance - Patient 50 - 74%    Lower Body Dressing/Undressing Lower body dressing      What is the patient wearing?: Underwear/pull up, Pants     Lower body assist Assist for lower body dressing: Moderate Assistance - Patient 50 - 74%     Toileting Toileting    Toileting assist Assist for toileting: Minimal Assistance - Patient > 75%     Transfers Chair/bed transfer  Transfers assist     Chair/bed transfer assist level: Supervision/Verbal cueing     Locomotion Ambulation   Ambulation assist      Assist level: Supervision/Verbal cueing Assistive device: Walker-rolling Max distance: 190   Walk 10 feet activity   Assist     Assist level: Contact Guard/Touching assist Assistive device: No Device   Walk 50 feet activity   Assist    Assist level: Supervision/Verbal cueing Assistive device: Walker-rolling    Walk 150 feet activity  Assist    Assist level: Supervision/Verbal cueing Assistive device: Walker-rolling    Walk 10 feet on uneven surface  activity   Assist     Assist level: Minimal Assistance - Patient > 75% Assistive device: Hand held assist   Wheelchair     Assist Is the patient using a wheelchair?: Yes Type of Wheelchair: Manual    Wheelchair assist level: Maximal Assistance - Patient 25 - 49% Max wheelchair distance: 50    Wheelchair 50 feet with 2 turns activity    Assist        Assist Level: Maximal Assistance - Patient 25 - 49%   Wheelchair 150 feet activity     Assist   Wheelchair 150 feet activity did not occur:  (R hand inattention)   Assist Level: Maximal Assistance - Patient 25 - 49% (based on PT eval documentation)   Blood pressure 120/76, pulse 73, temperature 98.1 F (36.7 C), resp. rate 18, height '5\' 7"'$  (1.702 m), weight 74.1 kg, SpO2 100 %.  Medical Problem List and Plan: 1. Functional deficits secondary to left basal ganglia infarct             -patient may shower             -ELOS/Goals: 7/7 S             -Continue CIR with PT, OT, SLP  -Amb 30 feet with RW and CGA 2.  Antithrombotics: -DVT/anticoagulation:  Pharmaceutical: Heparin             -antiplatelet therapy: Aspirin 81 mg daily and Plavix 75 mg daily for 3 weeks then aspirin alone 3. Pain Management: Tylenol 6/24 Pain reported in rt knee today.  Continue use of Tylenol. 6/25 No pain reported this morning. 4. Mood/Sleep: LCSW to evaluate and provide emotional support             -antipsychotic agents: n/a 5. Neuropsych/cognition: This patient is not capable of making decisions on her own behalf. 6. Skin/Wound Care: Routine skin care checks 7. Fluids/Electrolytes/Nutrition: Routine I's and O's and follow-up chemistries 8. Hypertension: Continue lisinopril/HCTZ 6/30 Well controlled, continue current treatment    11/29/2021    5:21 AM 11/28/2021    8:56 PM 11/28/2021    1:27 PM  Vitals with BMI  Systolic [phone]  Diastolic 76 77 72  Pulse 73 85 81    9. Depression continue fluoxetine 60 mg daily Reports mood is good overall, continue current treatment 10. Diabetes mellitus-2: A1c 7.7; monitor CBGs             -- SSI before every meal and nightly             -- Continue Semglee 17 units daily  -Home meds include Jardiance and trulicity 2/42 Will restart home jardiance '10mg'$  Recent Labs    11/28/21 1701 11/28/21 2057 11/29/21 0536  GLUCAP 126* 127* 84   6/30 Very well controlled CBG 11. Hyperlipidemia: Continue atorvastatin 40 mg daily,counseling on healthy  diet 12.Anemia: Repeat hgb tomorrow.  6/25 Hgb stable at 9.6, trend with weekly labs.  13. Hypoalbuminemia -mild   -eating 95-100% of last few meals LOS: 7 days A FACE TO FACE EVALUATION WAS PERFORMED  Hayley Bryant 11/29/2021, 8:03 AM

## 2021-11-29 NOTE — Progress Notes (Signed)
Occupational Therapy Session Note  Patient Details  Name: SARIKA BALDINI MRN: [id number] Date of Birth: [birth year]  Today's Date: 11/29/2021 OT Individual Time: 1100-1200 OT Individual Time Calculation (min): 60 min    Short Term Goals: Week 1:  OT Short Term Goal 1 (Week 1): Pt will completed donning a bra and pullover shirt with no more than min assist on two consecutive occasions. OT Short Term Goal 2 (Week 1): Pt will complete LB dressing with min guard assist sit to stand for two consecutive sessions. OT Short Term Goal 3 (Week 1): Pt will use the RUE as an active assist for bathing and dressing tasks when washing the LUE or pulling up garments with min assist overall. OT Short Term Goal 4 (Week 1): Pt will complete toilet transfer at supervision level without an assistive device to regular toilet. Week 2:     Skilled Therapeutic Interventions/Progress Updates:    Patient seen this morning for skilled OT services, patient seated in recliner upon arrival. The pt expressed a desire to take a shower and was able to complete a stand pivot transfer from the recliner to the w/c with using the RW with MinA for safe performance.   The pt was able to transfer from the w/c to the shower chair incorporating the grab bar and the RW for balance and placement using a stand pivot transfer with MinA.  The pt was able to doff her over head shirt with vc's she was able to doff her pants and underwear with ModA , she was MaxA for her socks and shoes.  The pt was able to wash her UB with s/u assist, she required Mod A and verbal cues for her bathing her LB secondary to challenges with coordination, she received both vc's and physical prompts for incorporating her RUE during task initiation.  The pt was able to apply deodorant and lotion with vc's and physical prompts.  The pt completed UB dressing of a over head top with s/u assist, she was MaxA for donning her underwear and pants, socks,  and shoes secondary to  challenges with recall.  The pt transferred from the w/c to the recliner with MinA using the RW for a stand pivot transfer.  The pt went on to complete UB exercise using the medium grade theraband ,  2 sets of 10 for shld flexion, horozontal abduction, and chest press, at the end of OT treatment the pt's call light and bedside table were placed within reach, her alarm was in place and all additional needs were addressed, the pt hand no c/o pain during this treatment session.   Therapy Documentation Precautions:  Precautions Precautions: Fall Precaution Comments: right UE hemiparesis Restrictions Weight Bearing Restrictions: No   Therapy/Group: Individual Therapy  Yvonne Kendall 11/29/2021, 1:16 PM

## 2021-11-30 LAB — GLUCOSE, CAPILLARY
Glucose-Capillary: 95 mg/dL (ref 70–99)
Glucose-Capillary: 101 mg/dL — ABNORMAL HIGH (ref 70–99)
Glucose-Capillary: 106 mg/dL — ABNORMAL HIGH (ref 70–99)
Glucose-Capillary: 145 mg/dL — ABNORMAL HIGH (ref 70–99)

## 2021-11-30 NOTE — Progress Notes (Signed)
PROGRESS NOTE   Subjective/Complaints: Patient seen about to get up and work with therapy today.  Doing well no overnight complaints.  ROS: Negative for fever, chills, CP, SOB, N/V/C/D. + weakness and myalgia  Objective:   No results found. No results for input(s): "WBC", "HGB", "HCT", "PLT" in the last 72 hours.  No results for input(s): "NA", "K", "CL", "CO2", "GLUCOSE", "BUN", "CREATININE", "CALCIUM" in the last 72 hours.   Intake/Output Summary (Last 24 hours) at 11/30/2021 1615 Last data filed at 11/30/2021 1356 Gross per 24 hour  Intake 820 ml  Output --  Net 820 ml        Physical Exam: Vital Signs Blood pressure 119/79, pulse 75, temperature 98.3 F (36.8 C), temperature source Oral, resp. rate 16, height '5\' 7"'$  (1.702 m), weight 74.1 kg, SpO2 100 %.    General: Alert and oriented x 3, No apparent distress HEENT: Head is normocephalic, atraumatic, PERRLA, EOMI, sclera anicteric, oral mucosa pink and moist, dentition intact, ext ear canals clear,  Neck: Supple without JVD or lymphadenopathy Heart: Reg rate and rhythm. No murmurs rubs or gallops Chest: CTA bilaterally without wheezes, rales, or rhonchi; no distress Abdomen: Soft, non-tender, non-distended, bowel sounds positive. Extremities: No clubbing, cyanosis, or edema. Pulses are 2+ Psych: Pt's affect is appropriate. Pt is cooperative Skin: Clean and intact without signs of breakdown Neuro:  Sensation intact to LT.  Mod dysarthria but understandable speech.  Musculoskeletal: Strength 4/5 left side, 3/5 right side.   Assessment/Plan: 1. Functional deficits which require 3+ hours per day of interdisciplinary therapy in a comprehensive inpatient rehab setting. Physiatrist is providing close team supervision and 24 hour management of active medical problems listed below. Physiatrist and rehab team continue to assess barriers to discharge/monitor patient  progress toward functional and medical goals  Care Tool:  Bathing    Body parts bathed by patient: Chest, Abdomen, Front perineal area, Buttocks, Face, Left lower leg, Right lower leg, Left upper leg, Right upper leg, Right arm   Body parts bathed by helper: Left arm     Bathing assist Assist Level: Minimal Assistance - Patient > 75%     Upper Body Dressing/Undressing Upper body dressing   What is the patient wearing?: Pull over shirt, Bra    Upper body assist Assist Level: Moderate Assistance - Patient 50 - 74%    Lower Body Dressing/Undressing Lower body dressing      What is the patient wearing?: Underwear/pull up, Pants     Lower body assist Assist for lower body dressing: Moderate Assistance - Patient 50 - 74%     Toileting Toileting    Toileting assist Assist for toileting: Minimal Assistance - Patient > 75%     Transfers Chair/bed transfer  Transfers assist     Chair/bed transfer assist level: Supervision/Verbal cueing     Locomotion Ambulation   Ambulation assist      Assist level: Supervision/Verbal cueing Assistive device: Walker-rolling Max distance: 190   Walk 10 feet activity   Assist     Assist level: Contact Guard/Touching assist Assistive device: No Device   Walk 50 feet activity   Assist    Assist level: Supervision/Verbal  cueing Assistive device: Walker-rolling    Walk 150 feet activity   Assist    Assist level: Supervision/Verbal cueing Assistive device: Walker-rolling    Walk 10 feet on uneven surface  activity   Assist     Assist level: Minimal Assistance - Patient > 75% Assistive device: Hand held assist   Wheelchair     Assist Is the patient using a wheelchair?: Yes Type of Wheelchair: Manual    Wheelchair assist level: Maximal Assistance - Patient 25 - 49% Max wheelchair distance: 50    Wheelchair 50 feet with 2 turns activity    Assist        Assist Level: Maximal Assistance -  Patient 25 - 49%   Wheelchair 150 feet activity     Assist  Wheelchair 150 feet activity did not occur:  (R hand inattention)   Assist Level: Maximal Assistance - Patient 25 - 49% (based on PT eval documentation)   Blood pressure 119/79, pulse 75, temperature 98.3 F (36.8 C), temperature source Oral, resp. rate 16, height '5\' 7"'$  (1.702 m), weight 74.1 kg, SpO2 100 %.  Medical Problem List and Plan: 1. Functional deficits secondary to left basal ganglia infarct             -patient may shower             -ELOS/Goals: 7-9 days S             -Admit to CIR 2.  Antithrombotics: -DVT/anticoagulation:  Pharmaceutical: Heparin             -antiplatelet therapy: Aspirin 81 mg daily and Plavix 75 mg daily for 3 weeks then aspirin alone 3. Pain Management: Tylenol 6/24 Pain reported in rt knee today.  Continue use of Tylenol. 6/25 No pain reported this morning. 4. Mood/Sleep: LCSW to evaluate and provide emotional support             -antipsychotic agents: n/a 5. Neuropsych/cognition: This patient is not capable of making decisions on her own behalf. 6. Skin/Wound Care: Routine skin care checks 7. Fluids/Electrolytes/Nutrition: Routine I's and O's and follow-up chemistries 8. Hypertension: Continue lisinopril/HCTZ 6/30 BP well controlled, continue current treatment 7/1 Ideal range for BP    11/30/2021    2:18 PM 11/30/2021    3:04 AM 11/29/2021    7:02 PM  Vitals with BMI  Systolic 277 99 412  Diastolic 79 65 71  Pulse 75 79 70    9. Depression continue fluoxetine 20 mg daily 10. Diabetes mellitus-2: A1c 7.7; monitor CBGs             -- SSI before every meal and nightly             -- Continue Semglee 17 units daily -Home meds include Jardiance and Trulicity 8/78 Restarted on home Jardiance 10 mg 6/30 Very well controlled CBG 7/1 Excellent CBG control.  CBG (last 3)  Recent Labs    11/29/21 2113 11/30/21 0624 11/30/21 1140  GLUCAP 126* 95 101*    11. Hyperlipidemia:  Continue atorvastatin 40 mg daily 12.Anemia: Repeat hgb tomorrow.  6/25 Hgb stable at 9.6, trend with weekly labs.  13. Hypoalbuminemia -mild              -eating 95-100% of last few meals 7/1 Eating well, follow with weekly labs  LOS: 8 days A FACE TO FACE EVALUATION WAS PERFORMED  Luetta Nutting 11/30/2021, 4:15 PM

## 2021-11-30 NOTE — Progress Notes (Signed)
Physical Therapy Session Note  Patient Details  Name: Hayley Bryant MRN: [id number] Date of Birth: [birth year]  Today's Date: 11/30/2021 PT Individual Time: 1000-1045 PT Individual Time Calculation (min): 45 min   Short Term Goals: Week 1:  PT Short Term Goal 1 (Week 1): = LTGs due to ELOS  Skilled Therapeutic Interventions/Progress Updates:    Pt received in bathroom handed off from NT. Pt kicked off underwear and was able to pick up with RW and CGA. Deposited dirty underwear in drawer with CGA and No AD, no LOB noted. Pt washed hands in standing with supervision. Pt asked to brush teeth and "freshen up," did so in standing. Pt initially requested help to doff soap cap, but was able to remove with supervision when encouraged. Pt washed bottom in standing before retrieving and donning clean underwear with min a to pull over hips on R side. Pt then applied deodorant in sitting with increased time to manage cap and apply. Donned socks with supervision and increased time. Therapist donned shoes tot A for time.   Pt transported to stairwell for time management and energy conservation.  Pt then navigated 12 step x 3 in hospital stairwell with R hand rail and CGA to occ min A with fatigue. Seated rest breaks between attempts. Pt self selects alternating technique while ascending and step to leading with L foot while descending. Educated on descending with RLE first for safety and ascending with step to technique when fatigued. Pt ambulated back to room  x 120 ft  with no AD, no LOB or knee buckling noted, and returned to bed with supervision, was left with all needs in reach and alarm active.   Therapy Documentation Precautions:  Precautions Precautions: Fall Precaution Comments: right UE hemiparesis Restrictions Weight Bearing Restrictions: No General:    Therapy/Group: Individual Therapy  Mickel Fuchs 11/30/2021, 10:33 AM

## 2021-12-01 LAB — GLUCOSE, CAPILLARY
Glucose-Capillary: 95 mg/dL (ref 70–99)
Glucose-Capillary: 89 mg/dL (ref 70–99)
Glucose-Capillary: 163 mg/dL — ABNORMAL HIGH (ref 70–99)
Glucose-Capillary: 121 mg/dL — ABNORMAL HIGH (ref 70–99)

## 2021-12-01 MED ORDER — INSULIN GLARGINE-YFGN 100 UNIT/ML ~~LOC~~ SOLN
15.0000 [IU] | Freq: Every day | SUBCUTANEOUS | Status: DC
  Administered 2021-12-02 – 2021-12-06 (×5): 15 [IU] via SUBCUTANEOUS
  Filled 2021-12-01 (×5): qty 0.15

## 2021-12-01 NOTE — Progress Notes (Signed)
Occupational Therapy Session Note  Patient Details  Name: Hayley Bryant MRN: [id number] Date of Birth: 1970/11/18  Today's Date: 12/01/2021 OT Individual Time: 0263-7858 OT Individual Time Calculation (min): 45 min    Short Term Goals: Week 1:  OT Short Term Goal 1 (Week 1): Pt will completed donning a bra and pullover shirt with no more than min assist on two consecutive occasions. OT Short Term Goal 1 - Progress (Week 1): Progressing toward goal OT Short Term Goal 2 (Week 1): Pt will complete LB dressing with min guard assist sit to stand for two consecutive sessions. OT Short Term Goal 2 - Progress (Week 1): Progressing toward goal OT Short Term Goal 3 (Week 1): Pt will use the RUE as an active assist for bathing and dressing tasks when washing the LUE or pulling up garments with min assist overall. OT Short Term Goal 3 - Progress (Week 1): Progressing toward goal OT Short Term Goal 4 (Week 1): Pt will complete toilet transfer at supervision level without an assistive device to regular toilet. OT Short Term Goal 4 - Progress (Week 1): Progressing toward goal Week 2:  OT Short Term Goal 1 (Week 2): STG=LTG  Skilled Therapeutic Interventions/Progress Updates:    Patient seen this date for skilled OT treatment, pt seated in recliner upon arrival. The pt indicated that she needed to go to the restroom, pt sit to stand with MinA using RW for standing balance. The pt was able to ambulate to the restroom using the RW with MinA , she was able to incorporate the grab bars for addiitonal balance while doffng her LB garments, pants and underwear.  The pt was able to complete toileting task  inclusive of wiping her perineal area and donning her underwear and pant with MinA.  The pt was able to ambulate to her living quarters with CGA and additional time  using the RW for balance  The pt washed her hand at sink LOF with s/u assist and returned to the recliner with CGA using the RW for safety.  The pt  completed UB theraex using medium grade theraband 2 sets of 10 with 2 restbreak between sets for shld flexion, chest press, and horizontal abduction.  The pt was able to ambulate down the hall 51f using the RW with CGA, she was able to push herself back to her room with Min/ModA secondary to deconditioning.  The pt returned to the recliner with her call light and bedside table within reach, the chair alarm was in place and all additional needs were addressed. The pt had no report of pain this treatment session.   Therapy Documentation Precautions:  Precautions Precautions: Fall Precaution Comments: right UE hemiparesis Restrictions Weight Bearing Restrictions: No  Therapy/Group: Individual Therapy  LYvonne Kendall7/07/2021, 5:19 PM

## 2021-12-01 NOTE — Progress Notes (Addendum)
PROGRESS NOTE   Subjective/Complaints: Patient seen in bed resting.  Said she had a  good work out with PT yesterday and used the stairs. No overnight complaints.  ROS: Negative for fever, chills, CP, SOB, N/V/C/D. + weakness and myalgia  Objective:   No results found. No results for input(s): "WBC", "HGB", "HCT", "PLT" in the last 72 hours.  No results for input(s): "NA", "K", "CL", "CO2", "GLUCOSE", "BUN", "CREATININE", "CALCIUM" in the last 72 hours.   Intake/Output Summary (Last 24 hours) at 12/01/2021 1302 Last data filed at 12/01/2021 0740 Gross per 24 hour  Intake 4220 ml  Output --  Net 4220 ml        Physical Exam: Vital Signs Blood pressure 100/68, pulse 71, temperature (!) 97.5 F (36.4 C), temperature source Oral, resp. rate 18, height '5\' 7"'$  (1.702 m), weight 74.1 kg, SpO2 100 %.    General: Alert and oriented x 3, No apparent distress HEENT: Head is normocephalic, atraumatic, PERRLA, EOMI, sclera anicteric, oral mucosa pink and moist, dentition intact, ext ear canals clear,  Neck: Supple without JVD or lymphadenopathy Heart: Reg rate and rhythm. No murmurs rubs or gallops Chest: CTA bilaterally without wheezes, rales, or rhonchi; no distress Abdomen: Soft, non-tender, non-distended, bowel sounds positive. Extremities: No clubbing, cyanosis, or edema. Pulses are 2+ Psych: Pt's affect is appropriate. Pt is cooperative Skin: Clean and intact without signs of breakdown Neuro:  Sensation intact to LT.  Mod dysarthria but understandable speech.  Musculoskeletal: Strength 4/5 left side, 3/5 right side.   Assessment/Plan: 1. Functional deficits which require 3+ hours per day of interdisciplinary therapy in a comprehensive inpatient rehab setting. Physiatrist is providing close team supervision and 24 hour management of active medical problems listed below. Physiatrist and rehab team continue to assess barriers  to discharge/monitor patient progress toward functional and medical goals  Care Tool:  Bathing    Body parts bathed by patient: Chest, Abdomen, Front perineal area, Buttocks, Face, Left lower leg, Right lower leg, Left upper leg, Right upper leg, Right arm   Body parts bathed by helper: Left arm     Bathing assist Assist Level: Minimal Assistance - Patient > 75%     Upper Body Dressing/Undressing Upper body dressing   What is the patient wearing?: Pull over shirt, Bra    Upper body assist Assist Level: Moderate Assistance - Patient 50 - 74%    Lower Body Dressing/Undressing Lower body dressing      What is the patient wearing?: Underwear/pull up, Pants     Lower body assist Assist for lower body dressing: Moderate Assistance - Patient 50 - 74%     Toileting Toileting    Toileting assist Assist for toileting: Minimal Assistance - Patient > 75%     Transfers Chair/bed transfer  Transfers assist     Chair/bed transfer assist level: Supervision/Verbal cueing     Locomotion Ambulation   Ambulation assist      Assist level: Supervision/Verbal cueing Assistive device: Walker-rolling Max distance: 190   Walk 10 feet activity   Assist     Assist level: Contact Guard/Touching assist Assistive device: No Device   Walk 50 feet activity  Assist    Assist level: Supervision/Verbal cueing Assistive device: Walker-rolling    Walk 150 feet activity   Assist    Assist level: Supervision/Verbal cueing Assistive device: Walker-rolling    Walk 10 feet on uneven surface  activity   Assist     Assist level: Minimal Assistance - Patient > 75% Assistive device: Hand held assist   Wheelchair     Assist Is the patient using a wheelchair?: Yes Type of Wheelchair: Manual    Wheelchair assist level: Maximal Assistance - Patient 25 - 49% Max wheelchair distance: 50    Wheelchair 50 feet with 2 turns activity    Assist         Assist Level: Maximal Assistance - Patient 25 - 49%   Wheelchair 150 feet activity     Assist  Wheelchair 150 feet activity did not occur:  (R hand inattention)   Assist Level: Maximal Assistance - Patient 25 - 49% (based on PT eval documentation)   Blood pressure 100/68, pulse 71, temperature (!) 97.5 F (36.4 C), temperature source Oral, resp. rate 18, height '5\' 7"'$  (1.702 m), weight 74.1 kg, SpO2 100 %.  Medical Problem List and Plan: 1. Functional deficits secondary to left basal ganglia infarct             -patient may shower             -ELOS/Goals: 7-9 days S             -Admit to CIR 2.  Antithrombotics: -DVT/anticoagulation:  Pharmaceutical: Heparin             -antiplatelet therapy: Aspirin 81 mg daily and Plavix 75 mg daily for 3 weeks then aspirin alone 3. Pain Management: Tylenol 6/24 Pain reported in rt knee today.  Continue use of Tylenol. 6/25 No pain reported this morning. 4. Mood/Sleep: LCSW to evaluate and provide emotional support             -antipsychotic agents: n/a 5. Neuropsych/cognition: This patient is not capable of making decisions on her own behalf. 6. Skin/Wound Care: Routine skin care checks 7. Fluids/Electrolytes/Nutrition: Routine I's and O's and follow-up chemistries 8. Hypertension: Continue lisinopril/HCTZ 6/30 BP well controlled, continue current treatment 7/1 Ideal range for BP 7/2 one soft BP value, but otherwise, well-controlled    12/01/2021    3:50 AM 11/30/2021    7:16 PM 11/30/2021    2:18 PM  Vitals with BMI  Systolic 098 119 147  Diastolic 68 69 79  Pulse 71 75 75    9. Depression continue fluoxetine 20 mg daily 10. Diabetes mellitus-2: A1c 7.7; monitor CBGs             -- SSI before every meal and nightly             -- Continue Semglee 17 units daily -Home meds include Jardiance and Trulicity 8/29 Restarted on home Jardiance 10 mg 6/30 Very well controlled CBG 7/1 Excellent CBG control. 7/2 Continue current regimen,  Jardiance 10 mg CBG (last 3)  Recent Labs    11/30/21 2038 12/01/21 0607 12/01/21 1153  GLUCAP 145* 95 89    11. Hyperlipidemia: Continue atorvastatin 40 mg daily 12.Anemia: Repeat hgb tomorrow.  6/25 Hgb stable at 9.6, trend with weekly labs.  13. Hypoalbuminemia -mild              -eating 95-100% of last few meals 7/2 Eating well, follow with weekly labs  LOS: 9 days A FACE TO  FACE EVALUATION WAS PERFORMED  Luetta Nutting 12/01/2021, 1:02 PM

## 2021-12-02 DIAGNOSIS — R519 Headache, unspecified: Secondary | ICD-10-CM

## 2021-12-02 LAB — GLUCOSE, CAPILLARY
Glucose-Capillary: 105 mg/dL — ABNORMAL HIGH (ref 70–99)
Glucose-Capillary: 106 mg/dL — ABNORMAL HIGH (ref 70–99)
Glucose-Capillary: 124 mg/dL — ABNORMAL HIGH (ref 70–99)
Glucose-Capillary: 125 mg/dL — ABNORMAL HIGH (ref 70–99)

## 2021-12-02 LAB — BASIC METABOLIC PANEL
Anion gap: 12 (ref 5–15)
BUN: 19 mg/dL (ref 6–20)
CO2: 24 mmol/L (ref 22–32)
Calcium: 9.2 mg/dL (ref 8.9–10.3)
Chloride: 100 mmol/L (ref 98–111)
Creatinine, Ser: 0.81 mg/dL (ref 0.44–1.00)
GFR, Estimated: >60 mL/min (ref >60)
Glucose, Bld: 104 mg/dL — ABNORMAL HIGH (ref 70–99)
Potassium: 3.9 mmol/L (ref 3.5–5.1)
Sodium: 136 mmol/L (ref 135–145)

## 2021-12-02 LAB — CBC
HCT: 30.7 % — ABNORMAL LOW (ref 36.0–46.0)
Hemoglobin: 9.9 g/dL — ABNORMAL LOW (ref 12.0–15.0)
MCH: 27.6 pg (ref 26.0–34.0)
MCHC: 32.2 g/dL (ref 30.0–36.0)
MCV: 85.5 fL (ref 80.0–100.0)
Platelets: 342 10*3/uL (ref 150–400)
RBC: 3.59 MIL/uL — ABNORMAL LOW (ref 3.87–5.11)
RDW: 15.1 % (ref 11.5–15.5)
WBC: 6.6 10*3/uL (ref 4.0–10.5)
nRBC: 0 % (ref 0.0–0.2)

## 2021-12-02 NOTE — Progress Notes (Signed)
PROGRESS NOTE   Subjective/Complaints:  No new concerns this AM. She had a mild HA last night that improved with tylenol.   ROS: Negative for fever, chills, CP, SOB, N/V/C/D. + weakness and myalgia. HA- resolved  Objective:   No results found. Recent Labs    12/02/21 0606  WBC 6.6  HGB 9.9*  HCT 30.7*  PLT 342    Recent Labs    12/02/21 0606  NA 136  K 3.9  CL 100  CO2 24  GLUCOSE 104*  BUN 19  CREATININE 0.81  CALCIUM 9.2     Intake/Output Summary (Last 24 hours) at 12/02/2021 0856 Last data filed at 12/02/2021 0729 Gross per 24 hour  Intake 1340 ml  Output --  Net 1340 ml         Physical Exam: Vital Signs Blood pressure 105/69, pulse 74, temperature 98.5 F (36.9 C), temperature source Oral, resp. rate 18, height '5\' 7"'$  (1.702 m), weight 74.1 kg, SpO2 100 %.    General: Alert and oriented x 3, No apparent distress HEENT: Head is normocephalic, atraumatic, PERRLA, EOMI, sclera anicteric, oral mucosa pink and moist Neck: Supple without JVD or lymphadenopathy Heart: Reg rate and rhythm. No murmurs rubs or gallops Chest: CTA bilaterally without wheezes, rales, or rhonchi, good air movement Abdomen: Soft, non-tender, non-distended, bowel sounds positive. Extremities: No clubbing, cyanosis, or edema. Pulses are 2+ Psych: Pt's affect is appropriate. Pt is cooperative, flat affect Skin: Clean and intact without signs of breakdown Neuro:  Sensation intact to LT.  Mod dysarthria but understandable speech.  Musculoskeletal: Strength 4/5 left side, 3/5 right side.   Assessment/Plan: 1. Functional deficits which require 3+ hours per day of interdisciplinary therapy in a comprehensive inpatient rehab setting. Physiatrist is providing close team supervision and 24 hour management of active medical problems listed below. Physiatrist and rehab team continue to assess barriers to discharge/monitor patient progress  toward functional and medical goals  Care Tool:  Bathing    Body parts bathed by patient: Chest, Abdomen, Front perineal area, Buttocks, Face, Left lower leg, Right lower leg, Left upper leg, Right upper leg, Right arm   Body parts bathed by helper: Left arm     Bathing assist Assist Level: Minimal Assistance - Patient > 75%     Upper Body Dressing/Undressing Upper body dressing   What is the patient wearing?: Pull over shirt, Bra    Upper body assist Assist Level: Moderate Assistance - Patient 50 - 74%    Lower Body Dressing/Undressing Lower body dressing      What is the patient wearing?: Underwear/pull up, Pants     Lower body assist Assist for lower body dressing: Moderate Assistance - Patient 50 - 74%     Toileting Toileting    Toileting assist Assist for toileting: Minimal Assistance - Patient > 75%     Transfers Chair/bed transfer  Transfers assist     Chair/bed transfer assist level: Supervision/Verbal cueing     Locomotion Ambulation   Ambulation assist      Assist level: Supervision/Verbal cueing Assistive device: Walker-rolling Max distance: 190   Walk 10 feet activity   Assist  Assist level: Contact Guard/Touching assist Assistive device: No Device   Walk 50 feet activity   Assist    Assist level: Supervision/Verbal cueing Assistive device: Walker-rolling    Walk 150 feet activity   Assist    Assist level: Supervision/Verbal cueing Assistive device: Walker-rolling    Walk 10 feet on uneven surface  activity   Assist     Assist level: Minimal Assistance - Patient > 75% Assistive device: Hand held assist   Wheelchair     Assist Is the patient using a wheelchair?: Yes Type of Wheelchair: Manual    Wheelchair assist level: Maximal Assistance - Patient 25 - 49% Max wheelchair distance: 50    Wheelchair 50 feet with 2 turns activity    Assist        Assist Level: Maximal Assistance - Patient  25 - 49%   Wheelchair 150 feet activity     Assist  Wheelchair 150 feet activity did not occur:  (R hand inattention)   Assist Level: Maximal Assistance - Patient 25 - 49% (based on PT eval documentation)   Blood pressure 105/69, pulse 74, temperature 98.5 F (36.9 C), temperature source Oral, resp. rate 18, height '5\' 7"'$  (1.702 m), weight 74.1 kg, SpO2 100 %.  Medical Problem List and Plan: 1. Functional deficits secondary to left basal ganglia infarct             -patient may shower             -ELOS/Goals: 7-9 days S             -Continue CIR PT, OT SLP 2.  Antithrombotics: -DVT/anticoagulation:  Pharmaceutical: Heparin             -antiplatelet therapy: Aspirin 81 mg daily and Plavix 75 mg daily for 3 weeks then aspirin alone 3. Pain Management: Tylenol 6/24 Pain reported in rt knee today.  Continue use of Tylenol. 6/25 No pain reported this morning. 4. Mood/Sleep: LCSW to evaluate and provide emotional support             -antipsychotic agents: n/a 5. Neuropsych/cognition: This patient is not capable of making decisions on her own behalf. 6. Skin/Wound Care: Routine skin care checks 7. Fluids/Electrolytes/Nutrition: Routine I's and O's and follow-up chemistries 8. Hypertension: Continue lisinopril/HCTZ 7/3 continues to be well controlled    12/02/2021    2:27 AM 12/01/2021    7:13 PM 12/01/2021    1:22 PM  Vitals with BMI  Systolic 664 403 474  Diastolic 69 69 65  Pulse 74 68 86    9. Depression continue fluoxetine 20 mg daily 10. Diabetes mellitus-2: A1c 7.7; monitor CBGs             -- SSI before every meal and nightly             -- Continue Semglee 17 units daily -Home meds include Jardiance and Trulicity 2/59 Restarted on home Jardiance 10 mg Continues to be well controlled- follow  CBG (last 3)  Recent Labs    12/01/21 1648 12/01/21 2105 12/02/21 0603  GLUCAP 163* 121* 105*     11. Hyperlipidemia: Continue atorvastatin 40 mg daily 12.Anemia: Repeat hgb  tomorrow.  6/25 Hgb stable at 9.6, trend with weekly labs.  13. Hypoalbuminemia -mild              -eating 95-100% of last few meals 7/2 Eating well, follow with weekly labs 14. Headache  -Improved, cont tylenol  LOS: 10 days A  FACE TO FACE EVALUATION WAS PERFORMED  Jennye Boroughs 12/02/2021, 8:56 AM

## 2021-12-02 NOTE — Progress Notes (Signed)
Patient resting at interval during shift, no acute distress,prn medication for headache pain that resolved with follow-up assessment.Monitor  and assisted, call bell within reach, bed alarm monitoring continue., continue regime

## 2021-12-02 NOTE — Progress Notes (Signed)
Speech Language Pathology Daily Session Note  Patient Details  Name: Hayley Bryant MRN: [id number] Date of Birth: 10/19/1970  Today's Date: 12/02/2021 SLP Individual Time: 1133-1202 SLP Individual Time Calculation (min): 29 min  Short Term Goals: Week 2: SLP Short Term Goal 1 (Week 2): STG = LTG d/t ELOS  Skilled Therapeutic Interventions: S: Pt seen this date for skilled ST intervention targeting speech intelligibility goals outlined in care plan. Pt received awake/alert and OOB in recliner chair, eating sliced apples. Recently finished with PT. Spouse present. No c/o pain. Agreeable to ST intervention in hospital room.  O: Pt continue to benefit from Max A multimodal cues to increase vocal quality to improve intelligibility and comprehensibility for communication partner at the word and short phrase level. Following clinician model prompts, prompts to maintain 4 or 5 on 5-point vocal intensity scale were successfully faded to Mod A multimodal cues. On probe, pt was prompted to repeat herself several times to improve comprehensibility. SLP provided environmental modifications (muted television, closed door, only one person to talk at a time) to aid in success receipt of pt's verbal message. Max A for emergent awareness of deficits. Provided practice to complete outside of session with pt and pt's spouse verbalizing understanding.   A: Pt remains stimulable for skilled ST intervention as evident by observed improvement in speech intelligibility with use of cueing hierarchy, compensatory speech intelligibility strategies, self-monitoring techniques/meta-cognitive skill development, and vocal intensity rating scale. Continue to recommend ST intervention to maximize pt's independence, improve quality of life, and decrease caregiver burden.  P: Pt left in recliner chair with all safety measures activated. Call bell within reach and all immediate needs met. Hand off to NT for glucose check. Continue per  current ST POC.  Pain Denies pain; NAD  Therapy/Group: Individual Therapy  Sreeja Spies A Juliano Mceachin 12/02/2021, 12:41 PM

## 2021-12-02 NOTE — Progress Notes (Signed)
Physical Therapy Session Note  Patient Details  Name: Hayley Bryant MRN: [id number] Date of Birth: 01-28-1971  Today's Date: 12/02/2021 PT Individual Time: 1032-1133 PT Individual Time Calculation (min): 61 min   Short Term Goals: Week 1:  PT Short Term Goal 1 (Week 1): = LTGs due to ELOS   Skilled Therapeutic Interventions/Progress Updates:  Patient supine in bed on entrance to room. Patient alert and agreeable to PT session. Husband asleep in recliner.   Patient with no pain complaint throughout session. Requests to wash up prior to session.   Therapeutic Activity: Bed Mobility: Patient performed supine --> sit with distant supervision/ Mod I using bedrails. No cueing required. Transfers: Patient performed sit<>stand and stand pivot transfers throughout session with distant/ close supervision. Provided verbal cues for hand placement intermittently when pt reaching out to walker to stand. Stands at sink and washes armpits, front and rear perianal all with setup. Encouraged pt to use R hand for all washing and dressing. Threading LE in underwear and pants while seated requires MinA for use of R hand. Pt pulls pants up in standing  using LUE to complete majority of LB dressing. ModA required for donning sports bra and top as pt neglects to utilize R hand throughout and struggles completely donning LUE into top as she does not use her R hand to pull shirt sleeve all the way up her arm.   Gait Training:  Patient ambulated 200' x2 ft using no AD with close supervision. Demonstrated improving R foot clearance with improved step height. Provided vc/ tc for maintaining good step pattern with BLE, level gaze. Pt ambulates forward and backward~20 ft with close supervision/ intermittent CGA for safety. Also completes lateral stepping for improved g. Med strengthening and balance challenge. Completes with CGA for correct rotation of hips when laterally stepping to pt's R side.   Neuromuscular  Re-ed: NMR facilitated during session with focus on dynamic standing balance and dynamic gait. Pt guided in BUE use with holding ball and standing on small foam wedge. Pt able to maintain balance with 2 minor LOB requiring minA to correct while touching ball to top of mirror frame. Pt guided in ambulation holding ball  with no AD and follows instructions for reach of ball or touch of ball to target for dual tasking.  NMR performed for improvements in motor control and coordination, balance, sequencing, judgement, and self confidence/ efficacy in performing all aspects of mobility at highest level of independence.   Patient seated upright  in recliner at end of session with brakes locked, belt alarm set, and all needs within reach.   Therapy Documentation Precautions:  Precautions Precautions: Fall Precaution Comments: right UE hemiparesis Restrictions Weight Bearing Restrictions: No General:   Vital Signs: Therapy Vitals Temp: 98.4 F (36.9 C) Pulse Rate: 89 Resp: 17 BP: 107/77 Patient Position (if appropriate): Sitting Oxygen Therapy SpO2: 100 % O2 Device: Room Air Pain:  No pain related this session.   Therapy/Group: Individual Therapy  Alger Simons PT, DPT, CSRS 12/02/2021, 5:01 PM

## 2021-12-02 NOTE — Progress Notes (Signed)
Occupational Therapy Session Note  Patient Details  Name: Hayley Bryant MRN: [id number] Date of Birth: January 14, 1971  Today's Date: 12/02/2021 OT Individual Time: 1345-1430 OT Individual Time Calculation (min): 45 min    Short Term Goals: Week 2:  OT Short Term Goal 1 (Week 2): STG=LTG  Skilled Therapeutic Interventions/Progress Updates:    S: Patient agreeable to participate in OT session. Reports 0/10 pain level.    Patient participated in skilled OT session focusing on ADL re-training while bathing in walk-in shower seated on shower chair. Therapist facilitated session by providing education for proper task sequencing, to remain on task, improving right side awareness and functional use  in order to improve functional performance while completing self care tasks. Pt provided with Min assist when ambulating with RW from recliner into bathroom to complete toileting, SBA for UB bathing, and Min A for LB dressing.  While seated in w/c at sink level, patient complete UB dressing with Min A (more assist needed for sports bra) and Min A for LB dressing with VC for form and technique (hemi dressing).   Therapy Documentation Precautions:  Precautions Precautions: Fall Precaution Comments: right UE hemiparesis Restrictions Weight Bearing Restrictions: No   Therapy/Group: Individual Therapy  Ailene Ravel, OTR/L,CBIS  Supplemental OT - [facility]  12/02/2021, 12:41 PM

## 2021-12-03 DIAGNOSIS — G8929 Other chronic pain: Secondary | ICD-10-CM

## 2021-12-03 DIAGNOSIS — M25561 Pain in right knee: Secondary | ICD-10-CM

## 2021-12-03 LAB — GLUCOSE, CAPILLARY
Glucose-Capillary: 156 mg/dL — ABNORMAL HIGH (ref 70–99)
Glucose-Capillary: 108 mg/dL — ABNORMAL HIGH (ref 70–99)
Glucose-Capillary: 152 mg/dL — ABNORMAL HIGH (ref 70–99)
Glucose-Capillary: 131 mg/dL — ABNORMAL HIGH (ref 70–99)

## 2021-12-03 NOTE — Progress Notes (Signed)
Occupational Therapy Session Note  Patient Details  Name: Hayley Bryant MRN: [id number] Date of Birth: 05/08/1971  Today's Date: 12/03/2021 OT Individual Time: 7673-4193 OT Individual Time Calculation (min): 60 min    Short Term Goals: Week 1:  OT Short Term Goal 1 (Week 1): Pt will completed donning a bra and pullover shirt with no more than min assist on two consecutive occasions. OT Short Term Goal 1 - Progress (Week 1): Progressing toward goal OT Short Term Goal 2 (Week 1): Pt will complete LB dressing with min guard assist sit to stand for two consecutive sessions. OT Short Term Goal 2 - Progress (Week 1): Progressing toward goal OT Short Term Goal 3 (Week 1): Pt will use the RUE as an active assist for bathing and dressing tasks when washing the LUE or pulling up garments with min assist overall. OT Short Term Goal 3 - Progress (Week 1): Progressing toward goal OT Short Term Goal 4 (Week 1): Pt will complete toilet transfer at supervision level without an assistive device to regular toilet. OT Short Term Goal 4 - Progress (Week 1): Progressing toward goal Week 2:  OT Short Term Goal 1 (Week 2): STG=LTG  Skilled Therapeutic Interventions/Progress Updates:    The pt was in bed upon arrival, pt expressed a desire to take a shower.  The pt was able to come from supine in bed to EOB  with SBA.. The pt was instructed to analyze how she felt internally prior to proceeding to transfer from EOB to w/c LOF.  The pt indicated that she was pain free and wasn't experiencing dizziness. The pt presented as MinA coming from sit to stand with initial cues for hand placement using the RW for additonal balance and safety.  The pt was CGA while ambulating to the restroom.  The pt was instructed to incorporate the grab bar for safe transfer while completing a toilet transfer, the pt was SBA for toileting  The pt transfer to the w/c using a stand pivot transfer with CGA and was able to transfer to the shower  chair with CGA incorporating the grab bar.  The pt was able to remove her over head top with instruction to doff the hemiparetic extremity first and follow with the strong side.  The pt was able to initiate bathing with s/u assist and close S.  The pt was able to transfer from the shower chair to the w/c with verbal instruction of including the grab bar for coming sit to stand and completing a stand pivot transfer with attention to placement at w/c LOF for BLE.  The pt was able to donn her over head shirt with s/u assist, she was MaxA for donning the brief and MinA for donning her pants with instruction to donn the hemi side first using the opposite hand and donning the full functioning extremity using the opposite hand for greater range.  The pt was able to brush her teeth with s/u assist and she was able to complete a stand pivot transfer using the RW for transfer placement in the recliner.  The pt was instructed to use her ball in the hemparetic hand to complete a hand squeezing exercises, bedside table and call light were within reach.  The pt hand no c/o pain this treatment session with family education relating BUE exercises to improve strength and  safety measures for  functional transfers.while incorporating the RW.   Therapy Documentation Precautions:  Precautions Precautions: Fall Precaution Comments: right UE hemiparesis  Restrictions Weight Bearing Restrictions: No   Therapy/Group: Individual Therapy  Yvonne Kendall 12/03/2021, 12:09 PM2

## 2021-12-03 NOTE — Progress Notes (Signed)
Physical Therapy Session Note  Patient Details  Name: Hayley Bryant MRN: [id number] Date of Birth: 06/09/1970  Today's Date: 12/03/2021 PT Individual Time: 1402-1505 PT Individual Time Calculation (min): 63 min   Short Term Goals: Week 1:  PT Short Term Goal 1 (Week 1): = LTGs due to ELOS Week 2:     Skilled Therapeutic Interventions/Progress Updates:      Therapy Documentation Precautions:  Precautions Precautions: Fall Precaution Comments: right UE hemiparesis Restrictions Weight Bearing Restrictions: No General:   Vital Signs:   Pain:   Mobility:   Locomotion :    Trunk/Postural Assessment :    Balance:   Exercises:   Other Treatments:      Therapy/Group: Individual Therapy  Alger Simons PT, DPT, CSRS 12/03/2021, 5:09 PM

## 2021-12-03 NOTE — Progress Notes (Signed)
PROGRESS NOTE   Subjective/Complaints:  She is in bed, no new concerns.   ROS: Negative for fever, cough, CP, SOB, N/V/C/D. + weakness and myalgia. HA- resolved  Objective:   No results found. Recent Labs    12/02/21 0606  WBC 6.6  HGB 9.9*  HCT 30.7*  PLT 342    Recent Labs    12/02/21 0606  NA 136  K 3.9  CL 100  CO2 24  GLUCOSE 104*  BUN 19  CREATININE 0.81  CALCIUM 9.2     Intake/Output Summary (Last 24 hours) at 12/03/2021 0929 Last data filed at 12/03/2021 0710 Gross per 24 hour  Intake 840 ml  Output --  Net 840 ml         Physical Exam: Vital Signs Blood pressure 93/78, pulse 89, temperature 98.3 F (36.8 C), temperature source Oral, resp. rate 18, height '5\' 7"'$  (1.702 m), weight 74.1 kg, SpO2 99 %.    General: Alert and oriented x 3, No apparent distress HEENT: Head is normocephalic, atraumatic, PERRLA, EOMI, sclera anicteric, oral mucosa pink and moist Neck: Supple without JVD or lymphadenopathy Heart: Reg rate and rhythm. No murmurs rubs or gallops Chest: CTA bilaterally without wheezes, rales, or rhonchi, good air movement Abdomen: Soft, non-tender, non-distended, bowel sounds positive. Extremities: No clubbing, cyanosis, or edema. Pulses are 2+ Psych: Pt's affect is appropriate. Pt is cooperative Skin: Clean and intact without signs of breakdown Neuro:  Sensation intact to LT.  Mod dysarthria but understandable speech.  Follows commands Musculoskeletal: Strength 4/5 left side, 3/5 right side.   Assessment/Plan: 1. Functional deficits which require 3+ hours per day of interdisciplinary therapy in a comprehensive inpatient rehab setting. Physiatrist is providing close team supervision and 24 hour management of active medical problems listed below. Physiatrist and rehab team continue to assess barriers to discharge/monitor patient progress toward functional and medical goals  Care  Tool:  Bathing    Body parts bathed by patient: Chest, Abdomen, Front perineal area, Buttocks, Face, Left lower leg, Right lower leg, Left upper leg, Right upper leg, Right arm   Body parts bathed by helper: Left arm     Bathing assist Assist Level: Minimal Assistance - Patient > 75%     Upper Body Dressing/Undressing Upper body dressing   What is the patient wearing?: Pull over shirt, Bra    Upper body assist Assist Level: Moderate Assistance - Patient 50 - 74%    Lower Body Dressing/Undressing Lower body dressing      What is the patient wearing?: Underwear/pull up, Pants     Lower body assist Assist for lower body dressing: Moderate Assistance - Patient 50 - 74%     Toileting Toileting    Toileting assist Assist for toileting: Minimal Assistance - Patient > 75%     Transfers Chair/bed transfer  Transfers assist     Chair/bed transfer assist level: Supervision/Verbal cueing     Locomotion Ambulation   Ambulation assist      Assist level: Supervision/Verbal cueing Assistive device: Walker-rolling Max distance: 190   Walk 10 feet activity   Assist     Assist level: Contact Guard/Touching assist Assistive device: No Device  Walk 50 feet activity   Assist    Assist level: Supervision/Verbal cueing Assistive device: Walker-rolling    Walk 150 feet activity   Assist    Assist level: Supervision/Verbal cueing Assistive device: Walker-rolling    Walk 10 feet on uneven surface  activity   Assist     Assist level: Minimal Assistance - Patient > 75% Assistive device: Hand held assist   Wheelchair     Assist Is the patient using a wheelchair?: Yes Type of Wheelchair: Manual    Wheelchair assist level: Maximal Assistance - Patient 25 - 49% Max wheelchair distance: 50    Wheelchair 50 feet with 2 turns activity    Assist        Assist Level: Maximal Assistance - Patient 25 - 49%   Wheelchair 150 feet activity      Assist  Wheelchair 150 feet activity did not occur:  (R hand inattention)   Assist Level: Maximal Assistance - Patient 25 - 49% (based on PT eval documentation)   Blood pressure 93/78, pulse 89, temperature 98.3 F (36.8 C), temperature source Oral, resp. rate 18, height '5\' 7"'$  (1.702 m), weight 74.1 kg, SpO2 99 %.  Medical Problem List and Plan: 1. Functional deficits secondary to left basal ganglia infarct             -patient may shower             -ELOS/Goals: 7-9 days S             -Continue CIR PT, OT SLP 2.  Antithrombotics: -DVT/anticoagulation:  Pharmaceutical: Heparin             -antiplatelet therapy: Aspirin 81 mg daily and Plavix 75 mg daily for 3 weeks then aspirin alone 3. Pain Management: Tylenol 6/24 Pain reported in rt knee today.  Continue use of Tylenol. 7/4 pain controlled with tylenol 4. Mood/Sleep: LCSW to evaluate and provide emotional support             -antipsychotic agents: n/a 5. Neuropsych/cognition: This patient is not capable of making decisions on her own behalf. 6. Skin/Wound Care: Routine skin care checks 7. Fluids/Electrolytes/Nutrition: Routine I's and O's and follow-up chemistries 8. Hypertension: Continue lisinopril/HCTZ 7/4 well controlled, follow    12/03/2021    5:08 AM 12/02/2021    6:21 PM 12/02/2021    1:56 PM  Vitals with BMI  Systolic 93 841 324  Diastolic 78 78 77  Pulse 89 77 89    9. Depression continue fluoxetine 20 mg daily 10. Diabetes mellitus-2: A1c 7.7; monitor CBGs             -- SSI before every meal and nightly             -- Continue Semglee 17 units daily -Home meds include Jardiance and Trulicity 4/01 Restarted on home Jardiance 10 mg 7/4 continue current regimen  CBG (last 3)  Recent Labs    12/02/21 1625 12/02/21 2050 12/03/21 0554  GLUCAP 124* 125* 156*     11. Hyperlipidemia: Continue atorvastatin 40 mg daily 12.Anemia: Repeat hgb tomorrow.  6/25 Hgb stable at 9.6, trend with weekly labs.  13.  Hypoalbuminemia -mild              -eating most of meals 7/2 Eating well, follow with weekly labs 14. Headache  -Improved, cont tylenol  LOS: 11 days A FACE TO FACE EVALUATION WAS PERFORMED  Jennye Boroughs 12/03/2021, 9:29 AM

## 2021-12-03 NOTE — Progress Notes (Signed)
Occupational Therapy Session Note  Patient Details  Name: Hayley Bryant MRN: [id number] Date of Birth: [birth year]  Today's Date: 12/03/2021 OT Individual Time: 1130-1158 OT Individual Time Calculation (min): 28 min    Short Term Goals: Week 2:  OT Short Term Goal 1 (Week 2): STG=LTG  Skilled Therapeutic Interventions/Progress Updates:    Pt resting in recliner upon arrival. OT intervention with focus on RUE function and right inattention. Pt practiced picking up small objects with RUE and placing in container. Pt practiced locating items in R visual field with min verbal cues to scan to Rt. Pt remained seated in recliner with belt alarm activated. All needs within reach.   Therapy Documentation Precautions:  Precautions Precautions: Fall Precaution Comments: right UE hemiparesis Restrictions Weight Bearing Restrictions: No   Pain: Pain Assessment Pain Scale: 0-10 Pain Score: 0-No pain   Therapy/Group: Individual Therapy  Leroy Libman 12/03/2021, 12:00 PM

## 2021-12-03 NOTE — Progress Notes (Signed)
Occupational Therapy Session Note  Patient Details  Name: Hayley Bryant MRN: [id number] Date of Birth: 11-07-1970  Today's Date: 12/03/2021 OT Individual Time: 2330-0762 OT Individual Time Calculation (min): 30 min    Short Term Goals: Week 1:  OT Short Term Goal 1 (Week 1): Pt will completed donning a bra and pullover shirt with no more than min assist on two consecutive occasions. OT Short Term Goal 1 - Progress (Week 1): Progressing toward goal OT Short Term Goal 2 (Week 1): Pt will complete LB dressing with min guard assist sit to stand for two consecutive sessions. OT Short Term Goal 2 - Progress (Week 1): Progressing toward goal OT Short Term Goal 3 (Week 1): Pt will use the RUE as an active assist for bathing and dressing tasks when washing the LUE or pulling up garments with min assist overall. OT Short Term Goal 3 - Progress (Week 1): Progressing toward goal OT Short Term Goal 4 (Week 1): Pt will complete toilet transfer at supervision level without an assistive device to regular toilet. OT Short Term Goal 4 - Progress (Week 1): Progressing toward goal Week 2:  OT Short Term Goal 1 (Week 2): STG=LTG  Skilled Therapeutic Interventions/Progress Updates:    Pt received seated in recliner, no c/o pain, agreeable to therapy. Session focus on self-care retraining, activity tolerance, RUE NMR in prep for improved ADL/IADL/func mobility performance + decreased caregiver burden.  Ambulatory toilet transfer CGA and no AD + increased time. Required assist to swap out brief , athough no incontinence. Small continent void of bladder. Min A to pull pants back up on her L. Washed and dried hands at sink CGA for balance and cues to incorporate RUE, wash all soap off R hand.  Ambulated to and from day room/ice cream social per pt request. Cues for RUE swing. Required 1 seated rest break due to fatigue. Pt able to scoop out "carb smart" ice cream with RUE and self-feed with built up handle  utensil.  Pt left seated in recliner with safety belt alarm engaged, call bell in reach, and all immediate needs met.     Therapy Documentation Precautions:  Precautions Precautions: Fall Precaution Comments: right UE hemiparesis Restrictions Weight Bearing Restrictions: No  Pain: no c/o   ADL: See Care Tool for more details.  Therapy/Group: Individual Therapy  Volanda Napoleon MS, OTR/L  12/03/2021, 6:58 AM

## 2021-12-04 DIAGNOSIS — R21 Rash and other nonspecific skin eruption: Secondary | ICD-10-CM

## 2021-12-04 LAB — GLUCOSE, CAPILLARY
Glucose-Capillary: 112 mg/dL — ABNORMAL HIGH (ref 70–99)
Glucose-Capillary: 105 mg/dL — ABNORMAL HIGH (ref 70–99)
Glucose-Capillary: 151 mg/dL — ABNORMAL HIGH (ref 70–99)
Glucose-Capillary: 134 mg/dL — ABNORMAL HIGH (ref 70–99)
Glucose-Capillary: 139 mg/dL — ABNORMAL HIGH (ref 70–99)

## 2021-12-04 NOTE — Plan of Care (Deleted)
  Problem: RH Expression Communication Goal: LTG Patient will increase speech intelligibility (SLP) Description: LTG: Patient will increase speech intelligibility at word/phrase/conversation level with cues, % of the time (SLP) Flowsheets (Taken 12/04/2021 1249) LTG: Patient will increase speech intelligibility (SLP): Moderate Assistance - Patient 50 - 74%

## 2021-12-04 NOTE — Progress Notes (Signed)
PROGRESS NOTE   Subjective/Complaints:  She is working with therapy this AM. OT noted rash on his L axilla and upper back. Pt reports this is not painful or itchy. Denies contact with new Deoderant, new detergent or other exposure.   ROS: Negative for fever, cough, CP, SOB, N/V/C/D. + weakness and myalgia. Rash   Objective:   No results found. Recent Labs    12/02/21 0606  WBC 6.6  HGB 9.9*  HCT 30.7*  PLT 342     Recent Labs    12/02/21 0606  NA 136  K 3.9  CL 100  CO2 24  GLUCOSE 104*  BUN 19  CREATININE 0.81  CALCIUM 9.2      Intake/Output Summary (Last 24 hours) at 12/04/2021 6144 Last data filed at 12/04/2021 0837 Gross per 24 hour  Intake 776 ml  Output --  Net 776 ml         Physical Exam: Vital Signs Blood pressure 113/67, pulse 79, temperature 98.4 F (36.9 C), temperature source Oral, resp. rate 18, height '5\' 7"'$  (1.702 m), weight 74.1 kg, SpO2 98 %.    General: Alert and oriented x 3, No apparent distress HEENT: Head is normocephalic, atraumatic, PERRLA, EOMI, sclera anicteric, oral mucosa pink and moist Neck: Supple without JVD or lymphadenopathy Heart: Reg rate and rhythm. No murmurs rubs or gallops Chest: CTA bilaterally without wheezes, rales, or rhonchi, good air movement Abdomen: Soft, non-tender, non-distended, bowel sounds positive. Extremities: No clubbing, cyanosis, or edema. Pulses are 2+ Psych: Pt's affect is appropriate. Pt is cooperative Skin: Clean and intact without signs of breakdown. Small areas of erythematous rash around L axilla, upper back Neuro:  Sensation intact to LT.  Mod dysarthria but understandable speech.  Follows commands Musculoskeletal: Strength 4/5 left side, 3/5 right side.   Assessment/Plan: 1. Functional deficits which require 3+ hours per day of interdisciplinary therapy in a comprehensive inpatient rehab setting. Physiatrist is providing close team  supervision and 24 hour management of active medical problems listed below. Physiatrist and rehab team continue to assess barriers to discharge/monitor patient progress toward functional and medical goals  Care Tool:  Bathing    Body parts bathed by patient: Chest, Abdomen, Front perineal area, Buttocks, Face, Left lower leg, Right lower leg, Left upper leg, Right upper leg, Right arm   Body parts bathed by helper: Left arm     Bathing assist Assist Level: Minimal Assistance - Patient > 75%     Upper Body Dressing/Undressing Upper body dressing   What is the patient wearing?: Pull over shirt, Bra    Upper body assist Assist Level: Moderate Assistance - Patient 50 - 74%    Lower Body Dressing/Undressing Lower body dressing      What is the patient wearing?: Underwear/pull up, Pants     Lower body assist Assist for lower body dressing: Moderate Assistance - Patient 50 - 74%     Toileting Toileting    Toileting assist Assist for toileting: Minimal Assistance - Patient > 75%     Transfers Chair/bed transfer  Transfers assist     Chair/bed transfer assist level: Supervision/Verbal cueing     Locomotion Ambulation  Ambulation assist      Assist level: Supervision/Verbal cueing Assistive device: Walker-rolling Max distance: 190   Walk 10 feet activity   Assist     Assist level: Contact Guard/Touching assist Assistive device: No Device   Walk 50 feet activity   Assist    Assist level: Supervision/Verbal cueing Assistive device: Walker-rolling    Walk 150 feet activity   Assist    Assist level: Supervision/Verbal cueing Assistive device: Walker-rolling    Walk 10 feet on uneven surface  activity   Assist     Assist level: Minimal Assistance - Patient > 75% Assistive device: Hand held assist   Wheelchair     Assist Is the patient using a wheelchair?: Yes Type of Wheelchair: Manual    Wheelchair assist level: Maximal  Assistance - Patient 25 - 49% Max wheelchair distance: 50    Wheelchair 50 feet with 2 turns activity    Assist        Assist Level: Maximal Assistance - Patient 25 - 49%   Wheelchair 150 feet activity     Assist  Wheelchair 150 feet activity did not occur:  (R hand inattention)   Assist Level: Maximal Assistance - Patient 25 - 49% (based on PT eval documentation)   Blood pressure 113/67, pulse 79, temperature 98.4 F (36.9 C), temperature source Oral, resp. rate 18, height '5\' 7"'$  (1.702 m), weight 74.1 kg, SpO2 98 %.  Medical Problem List and Plan: 1. Functional deficits secondary to left basal ganglia infarct             -patient may shower             -ELOS/Goals: 7-9 days S             -Continue CIR PT, OT SLP 2.  Antithrombotics: -DVT/anticoagulation:  Pharmaceutical: Heparin             -antiplatelet therapy: Aspirin 81 mg daily and Plavix 75 mg daily for 3 weeks then aspirin alone 3. Pain Management: Tylenol 6/24 Pain reported in rt knee today.  Continue use of Tylenol. 7/4 pain controlled with tylenol 4. Mood/Sleep: LCSW to evaluate and provide emotional support             -antipsychotic agents: n/a 5. Neuropsych/cognition: This patient is not capable of making decisions on her own behalf. 6. Skin/Wound Care: Routine skin care checks 7. Fluids/Electrolytes/Nutrition: Routine I's and O's and follow-up chemistries 8. Hypertension: Continue lisinopril/HCTZ 7/5 well controlled    12/04/2021    7:57 AM 12/04/2021    4:52 AM 12/03/2021    7:28 PM  Vitals with BMI  Systolic 098 119 147  Diastolic 67 63 64  Pulse 79 74 75    9. Depression continue fluoxetine 20 mg daily 10. Diabetes mellitus-2: A1c 7.7; monitor CBGs             -- SSI before every meal and nightly             -- Continue Semglee 17 units daily -Home meds include Jardiance and Trulicity 8/29 Restarted on home Jardiance 10 mg 7/5 well controlled  CBG (last 3)  Recent Labs    12/03/21 1644  12/03/21 2043 12/04/21 0618  GLUCAP 152* 131* 112*    11. Hyperlipidemia: Continue atorvastatin 40 mg daily 12.Anemia: Repeat hgb tomorrow.  6/25 Hgb stable at 9.6, trend with weekly labs.  13. Hypoalbuminemia -mild              -eating most  of meals 7/2 Eating well, follow with weekly labs 14. Headache  -Improved, cont tylenol 15. Rash, suspecting contact dermatitis  -not itching or painful, continue to monitor, avoid further exposure to new soaps or other products, can try topical steroid if worsens  LOS: 12 days A FACE TO FACE EVALUATION WAS PERFORMED  Jennye Boroughs 12/04/2021, 9:07 AM

## 2021-12-04 NOTE — Progress Notes (Signed)
Speech Language Pathology Daily Session Note  Patient Details  Name: GENNY CAULDER MRN: [id number] Date of Birth: [birth year]  Today's Date: 12/04/2021 SLP Individual Time: 0800-0830 SLP Individual Time Calculation (min): 30 min  Short Term Goals: Week 2: SLP Short Term Goal 1 (Week 2): STG = LTG d/t ELOS  Skilled Therapeutic Interventions: Skilled ST treatment focused on speech goals. SLP facilitated session by providing min A for performing ADLs including doffing gown and brief, and donning underwear, pants, and shirt. Pt washed face and underarms with washcloth with set-up A. Pt often requested for her husband to perform most tasks for her however pt was able to complete herself with additional verbal encouragement and assistance. While dressing, SLP observed some redness on her left underarm which extended out to her chest region, left arm/bicep, and back of neck. Pt denied discomfort/itchiness/sensitivity. SLP made MD aware when in/out for rounds and notified nurse. Pt benefited from max fading to mod A multimodal cues to use speech intelligibility strategies and breath support techniques to enhance comprehensibility at the short phrase level between pt, SLP, and husband. Pt responded well to visual vocal intensity scale to achieve and maintain a 4 or 5 on scale. Patient was left in recliner with alarm activated and immediate needs within reach at end of session. Continue per current plan of care.       Pain  None/denied  Therapy/Group: Individual Therapy  Luceil Herrin T Hoyte Ziebell 12/04/2021, 8:30 AM

## 2021-12-04 NOTE — Progress Notes (Signed)
Physical Therapy Session Note  Patient Details  Name: Hayley Bryant MRN: [id number] Date of Birth: November 25, 1970  Today's Date: 12/04/2021 PT Individual Time: 0904-1002 PT Individual Time Calculation (min): 58 min   Short Term Goals: Week 1:  PT Short Term Goal 1 (Week 1): = LTGs due to ELOS  Skilled Therapeutic Interventions/Progress Updates:  Patient seated upright in recliner on entrance to room. Patient alert and agreeable to PT session.   Patient with no pain complaint throughout session.  Therapeutic Activity: Transfers: Patient performed sit<>stand and stand pivot transfers throughout session with supervision. No LOB and no vc required for technique.   Gait Training/NMR:  Patient ambulated 180' x2 with no AD and close supervision. Demonstrated mild path deviation to R. Pt guided in retrieval of called color discs scattered throughout hallway along handrails on both sides. Pt able to retrieve al colors (6 different colors with 4 discs each color) correctly requiring some extra time to look while walking. Decreased gait speed with dual task. Retrieves all discs with R hand and does not drop from R hand.  NMR performed for improvements in motor control and coordination, balance, sequencing, judgement, and self confidence/ efficacy in performing all aspects of mobility at highest level of independence.   Patient seated upright  in recliner at end of session with brakes locked, belt alarm set, and all needs within reach.   Therapy Documentation Precautions:  Precautions Precautions: Fall Precaution Comments: right UE hemiparesis Restrictions Weight Bearing Restrictions: No General:   Vital Signs: Therapy Vitals Temp: 98.2 F (36.8 C) Pulse Rate: 71 Resp: 14 BP: (!) 126/98 Patient Position (if appropriate): Sitting Oxygen Therapy SpO2: 100 % O2 Device: Room Air Pain:  No pain related this session.   Therapy/Group: Individual Therapy  Alger Simons PT, DPT,  CSRS 12/04/2021, 5:33 PM

## 2021-12-04 NOTE — Plan of Care (Signed)
  Problem: RH Expression Communication Goal: LTG Patient will increase speech intelligibility (SLP) Description: LTG: Patient will increase speech intelligibility at word/phrase/conversation level with cues, % of the time (SLP) 12/04/2021 1250 by Charna Elizabeth T, CCC-SLP Note: Downgrade due to slow progress 12/04/2021 1249 by Charna Elizabeth T, CCC-SLP Flowsheets (Taken 12/04/2021 1249) LTG: Patient will increase speech intelligibility (SLP): Moderate Assistance - Patient 50 - 74% Goal: LTG Patient will increase word finding of common (SLP) Description: LTG:  Patient will increase word finding of common objects/daily info/abstract thoughts with cues using compensatory strategies (SLP). 12/04/2021 1250 by Charna Elizabeth T, CCC-SLP Note: Downgrade due to slow progress 12/04/2021 1250 by Charna Elizabeth T, CCC-SLP Flowsheets (Taken 12/04/2021 1250) LTG: Patient will increase word finding of common (SLP): Supervision

## 2021-12-04 NOTE — Progress Notes (Signed)
Patient ID: Hayley Bryant, female   DOB: Sep 14, 1970, 51 y.o.   MRN: [id number]  Met with pt to update her regarding team conference progress this week in therapies and reaching supervision level and being ready for discharge on Friday. Agreeable to rolling walker and OP therapies, once STD paperwork complete will email and give pt the originals. Continue to work on discharge needs.

## 2021-12-04 NOTE — Progress Notes (Signed)
Occupational Therapy Session Note  Patient Details  Name: Hayley Bryant MRN: [id number] Date of Birth: 1971/03/14  Today's Date: 12/04/2021 OT Individual Time: 0272-5366 OT Individual Time Calculation (min): 25 min    Short Term Goals: Week 1:  OT Short Term Goal 1 (Week 1): Pt will completed donning a bra and pullover shirt with no more than min assist on two consecutive occasions. OT Short Term Goal 1 - Progress (Week 1): Progressing toward goal OT Short Term Goal 2 (Week 1): Pt will complete LB dressing with min guard assist sit to stand for two consecutive sessions. OT Short Term Goal 2 - Progress (Week 1): Progressing toward goal OT Short Term Goal 3 (Week 1): Pt will use the RUE as an active assist for bathing and dressing tasks when washing the LUE or pulling up garments with min assist overall. OT Short Term Goal 3 - Progress (Week 1): Progressing toward goal OT Short Term Goal 4 (Week 1): Pt will complete toilet transfer at supervision level without an assistive device to regular toilet. OT Short Term Goal 4 - Progress (Week 1): Progressing toward goal Week 2:  OT Short Term Goal 1 (Week 2): STG=LTG  Skilled Therapeutic Interventions/Progress Updates:    Pt received in bathroom with RN, no c/o pain, agreeable to therapy. Session focus on dynamic standing balance, activity tolerance, RUE FMC in prep for improved ADL/IADL/func mobility performance + decreased caregiver burden.  Ambulated to and from gym CGA/ close S and no AD, 2 standing rest breaks due to fatigue and cues for R attention/ RUE swing.  Administered the Nine Hole Peg Test (NHPT)  with the following results: -R:3 min 46 seconds, required assist from L hand to achieve pincer grasp in order to insert pegs( 37-45 seconds is considered below average ; greater than 45 seconds is considered poor) thus demonstrating deficits in fine motor skills, hand dexterity, and speed.   Issued medium soft theraputty, pt able to return  demo putty squeezes, rolling into log shape, and inserting small beads into putty. Had difficulty rolling into ball shape and folding.  Pt left seated in recliner with safety belt alarm engaged, call bell in reach, and all immediate needs met.    Therapy Documentation Precautions:  Precautions Precautions: Fall Precaution Comments: right UE hemiparesis Restrictions Weight Bearing Restrictions: No  Pain:  No c/o ADL: See Care Tool for more details.   Therapy/Group: Individual Therapy  Volanda Napoleon MS, OTR/L  12/04/2021, 6:50 AM

## 2021-12-04 NOTE — Progress Notes (Signed)
Occupational Therapy Session Note  Patient Details  Name: Hayley Bryant MRN: [id number] Date of Birth: [birth year]  Today's Date: 12/04/2021 OT Individual Time: 7741-2878 OT Individual Time Calculation (min): 73 min    Short Term Goals: Week 2:  OT Short Term Goal 1 (Week 2): STG=LTG  Skilled Therapeutic Interventions/Progress Updates:    Patient received seated in recliner, and agreeable to OT session.  Ambulated with patient to therapy gym with facilitation for upright posture, weight shift onto and off of RLE, and pacing.  Patient reported fatigue at points during the walk, but did well with standing rest breaks.  Worked in gym to address static to dynamic stand balance and also stand tolerance.  Patient with tendency to shift weight toward left, and when assisted to shift to right locked right knee.  With gentle guiding of weight shift and right target patient showed improvement.  Neuromuscular reeducation to RUE to address reach, grasp, and basic in hand manipulation skills forst in standing, then when fatigued in sitting.  Worked on shaping hand to task, and changing position of item within hand.  Also worked on stabilizing items in ulnar hand while reaching for additional itemswith thumb and index/long finger.  Walked back to room, assisted patient to bathroom for continent void, and helped patient back to bed.  NT in room then to obtain vitals.    Therapy Documentation Precautions:  Precautions Precautions: Fall Precaution Comments: right UE hemiparesis Restrictions Weight Bearing Restrictions: No  Pain: Denies pain     Therapy/Group: Individual Therapy  Mariah Milling 12/04/2021, 3:13 PM

## 2021-12-04 NOTE — Patient Care Conference (Signed)
Inpatient RehabilitationTeam Conference and Plan of Care Update Date: 12/04/2021   Time: 12:10 PM    Patient Name: Hayley Bryant      Medical Record Number: [id number]  Date of Birth: 07-02-1970 Sex: Female         Room/Bed: 4M10C/4M10C-01 Payor Info: Payor: CIG[employer] / Plan: CIGNA MANAGED / Product Type: *No Product type* /    Admit Date/Time:  11/22/2021  2:42 PM  Primary Diagnosis:  Acute ischemic stroke [facility])  Hospital Problems: Principal Problem:   Acute ischemic stroke [facility])    Expected Discharge Date: Expected Discharge Date: 12/06/21  Team Members Present: Physician leading conference: Dr. Jennye Boroughs Social Worker Present: Ovidio Kin, LCSW Nurse Present: Dorien Chihuahua, RN PT Present: Alden Hipp, PT OT Present: Meriel Pica, OT SLP Present: Weston Anna, SLP PPS Coordinator present : Gunnar Fusi, SLP     Current Status/Progress Goal Weekly Team Focus  Bowel/Bladder   Patient is continent of bladder/bowel LBM 12/01/21  Maintain continence  Assess toilleting QS/PRN   Swallow/Nutrition/ Hydration             ADL's   cga to min A  supervision  balance, coordination, ADL training, functional mobility, pt/fam education   Mobility   Bed mobility = Mod I, Transfers = supervision, ambulation = supervision/ CGA, stairs = CGA  overall supervision  overall strengthening, R hemibody NMR, R awareness and increased use of RUE, standing balance/ tolerance, family education   Communication   Mod-Max A for use of speech intelligibility strategies, Min A for word-finding  Supervision A and Mod I  use of speech intelligibility strategies   Safety/Cognition/ Behavioral Observations  Min A  Supervision A  recall with use of compensatory strategies   Pain   Occassonal headache relieved with prn Tylenol, otherwise no c/o pan  < 2/10  Assess QS/PRN with f/u documentation   Skin   Skin Intact  Maintain skin integrity  Assess skin QS/PRN     Discharge Planning:  Husband here  daily unsure if participating in therapies, aware of supervision levle goals and DC this Friday. STD paperwork given to PA to complete   Team Discussion: Patient with new rash on back. Using tylenol prn headaches. Trouble with fine motor tasks, slow processing but good memory of strategies and word finding difficulties post stroke.  Patient on target to meet rehab goals: yes, currently needs supervision for ADLs. Completes ambulation and steps with CGA; more stable ambulating with a RW.  Needs mod - max assist for speech intelligibility and min assist for recall. Goals for discharge set for mod I overall  *See Care Plan and progress notes for long and short-term goals.   Revisions to Treatment Plan:  N/a   Teaching Needs: Safety, medications, transfers, toileting, dietary modifications,etc  Current Barriers to Discharge: Decreased caregiver support  Possible Resolutions to Barriers: Family education OP follow up services DME: RW     Medical Summary Current Status: diabetes mellitus type 2, HTN, Rash,anemia  Barriers to Discharge: Home enviroment access/layout;Medical stability  Barriers to Discharge Comments: diabetes mellitus type 2, HTN, Rash,anemia, rt knee pain Possible Resolutions to [employer] Focus: monitor BP, Glucose, Rash, follow labs   Continued Need for Acute Rehabilitation Level of Care: The patient requires daily medical management by a physician with specialized training in physical medicine and rehabilitation for the following reasons: Direction of a multidisciplinary physical rehabilitation program to maximize functional independence : Yes Medical management of patient stability for increased activity during participation in  an intensive rehabilitation regime.: Yes Analysis of laboratory values and/or radiology reports with any subsequent need for medication adjustment and/or medical intervention. : Yes   I attest that I was present, lead the team conference,  and concur with the assessment and plan of the team.   Dorien Chihuahua B 12/04/2021, 2:53 PM

## 2021-12-04 NOTE — Discharge Instructions (Addendum)
Inpatient Rehab Discharge Instructions  Hayley Bryant Discharge date and time: 12/06/2021   Activities/Precautions/ Functional Status: Activity: no lifting, driving, or strenuous exercise until cleared by MD Diet: cardiac diet Wound Care: none needed Functional status:  ___ No restrictions     ___ Walk up steps independently ___ 24/7 supervision/assistance   ___ Walk up steps with assistance __x_ Intermittent supervision/assistance  ___ Bathe/dress independently ___ Walk with walker     ___ Bathe/dress with assistance ___ Walk Independently    ___ Shower independently ___ Walk with assistance    __x_ Shower with assistance __x_ No alcohol     ___ Return to work/school ________  Special Instructions:  Please check your fingerstick blood sugars 4 times a day and record. Please bring this information to your primary care provider.  No driving, alcohol consumption or tobacco use.    COMMUNITY REFERRALS UPON DISCHARGE:    Outpatient: PT  OT  SP             Agency:[facility]  [address] [place] [address] Phone:[phone]              Appointment Date/Time:WILL CALL HUSBAND TO SET UP FOLLOW UP APPOINTMENTS  Medical Equipment/Items Ordered:ROLLING WALKER TO GET TUB BENCH ON OWN                                                 Agency/Supplier:[facility]   [phone]    STROKE/TIA DISCHARGE INSTRUCTIONS SMOKING Cigarette smoking nearly doubles your risk of having a stroke & is the single most alterable risk factor  If you smoke or have smoked in the last 12 months, you are advised to quit smoking for your health. Most of the excess cardiovascular risk related to smoking disappears within a year of stopping. Ask you doctor about anti-smoking medications [place] Quit Line: 1-800-QUIT NOW Free Smoking Cessation Classes [phone]  CHOLESTEROL Know your levels; limit fat & cholesterol in your diet  Lipid Panel     Component Value Date/Time   CHOL  129 11/16/2021 0302   TRIG 51 11/16/2021 0302   HDL 41 11/16/2021 0302   CHOLHDL 3.1 11/16/2021 0302   VLDL 10 11/16/2021 0302   LDLCALC 78 11/16/2021 0302     Many patients benefit from treatment even if their cholesterol is at goal. Goal: Total Cholesterol (CHOL) less than 160 Goal:  Triglycerides (TRIG) less than 150 Goal:  HDL greater than 40 Goal:  LDL (LDLCALC) less than 100   BLOOD PRESSURE American Stroke Association blood pressure target is less that 120/80 mm/Hg  Your discharge blood pressure is:  BP: (!) 126/98 Monitor your blood pressure Limit your salt and alcohol intake Many individuals will require more than one medication for high blood pressure  DIABETES (A1c is a blood sugar average for last 3 months) Goal HGBA1c is under 7% (HBGA1c is blood sugar average for last 3 months)  Diabetes: Diagnosis of diabetes:  Your A1c:7.7 %    Lab Results  Component Value Date   HGBA1C 7.7 (H) 11/16/2021    Your HGBA1c can be lowered with medications, healthy diet, and exercise. Check your blood sugar as directed by your physician Call your physician if you experience unexplained or low blood sugars.  PHYSICAL ACTIVITY/REHABILITATION Goal is 30 minutes at least 4 days per week  Activity:  Increase activity slowly, Therapies: Physical Therapy: Outpatient, Occupational Therapy: Outpatient, and Speech Therapy: Outpatient Return to work: when cleared by MD Activity decreases your risk of heart attack and stroke and makes your heart stronger.  It helps control your weight and blood pressure; helps you relax and can improve your mood. Participate in a regular exercise program. Talk with your doctor about the best form of exercise for you (dancing, walking, swimming, cycling).  DIET/WEIGHT Goal is to maintain a healthy weight  Your discharge diet is:  Diet Order             Diet Carb Modified Fluid consistency: Thin; Room service appropriate? Yes  Diet effective now                   thin liquids Your height is:  Height: '5\' 7"'$  (170.2 cm) Your current weight is: Weight: 74.1 kg Your Body Mass Index (BMI) is:  BMI (Calculated): 25.58 Following the type of diet specifically designed for you will help prevent another stroke. Your goal weight range is:   Your goal Body Mass Index (BMI) is 19-24. Healthy food habits can help reduce 3 risk factors for stroke:  High cholesterol, hypertension, and excess weight.  RESOURCES Stroke/Support Group:  Call [phone]   STROKE EDUCATION PROVIDED/REVIEWED AND GIVEN TO PATIENT Stroke warning signs and symptoms How to activate emergency medical system (call 911). Medications prescribed at discharge. Need for follow-up after discharge. Personal risk factors for stroke. Pneumonia vaccine given: No Flu vaccine given: No My questions have been answered, the writing is legible, and I understand these instructions.  I will adhere to these goals & educational materials that have been provided to me after my discharge from the hospital.    My questions have been answered and I understand these instructions. I will adhere to these goals and the provided educational materials after my discharge from the hospital.  Patient/Caregiver Signature _______________________________ Date __________  Clinician Signature _______________________________________ Date __________  Please bring this form and your medication list with you to all your follow-up doctor's appointments.

## 2021-12-04 NOTE — Plan of Care (Signed)
  Problem: RH Balance Goal: LTG Patient will maintain dynamic standing with ADLs (OT) Description: LTG:  Patient will maintain dynamic standing balance with assist during activities of daily living (OT)  Flowsheets (Taken 12/04/2021 1107) LTG: Pt will maintain dynamic standing balance during ADLs with: (LTG downgraded for safety concerns.) Supervision/Verbal cueing Note: LTG downgraded for safety concerns.   Problem: Sit to Stand Goal: LTG:  Patient will perform sit to stand in prep for activites of daily living with assistance level (OT) Description: LTG:  Patient will perform sit to stand in prep for activites of daily living with assistance level (OT) Flowsheets (Taken 12/04/2021 1107) LTG: PT will perform sit to stand in prep for activites of daily living with assistance level: (LTG downgraded for safety concerns.) Supervision/Verbal cueing Note: LTG downgraded for safety concerns.   Problem: RH Dressing Goal: LTG Patient will perform lower body dressing w/assist (OT) Description: LTG: Patient will perform lower body dressing with assist, with/without cues in positioning using equipment (OT) Flowsheets (Taken 12/04/2021 1107) LTG: Pt will perform lower body dressing with assistance level of: (LTG downgraded due to limited R [facility].) Minimal Assistance - Patient > 75% Note: LTG downgraded due to limited R [place].

## 2021-12-05 ENCOUNTER — Other Ambulatory Visit (HOSPITAL_COMMUNITY): Payer: Self-pay

## 2021-12-05 ENCOUNTER — Telehealth (HOSPITAL_COMMUNITY): Payer: Self-pay | Admitting: Pharmacy Technician

## 2021-12-05 DIAGNOSIS — M19012 Primary osteoarthritis, left shoulder: Secondary | ICD-10-CM

## 2021-12-05 LAB — GLUCOSE, CAPILLARY
Glucose-Capillary: 118 mg/dL — ABNORMAL HIGH (ref 70–99)
Glucose-Capillary: 179 mg/dL — ABNORMAL HIGH (ref 70–99)
Glucose-Capillary: 116 mg/dL — ABNORMAL HIGH (ref 70–99)

## 2021-12-05 MED ORDER — DICLOFENAC SODIUM 1 % EX GEL
2.0000 g | Freq: Four times a day (QID) | CUTANEOUS | Status: DC
  Administered 2021-12-06: 2 g via TOPICAL
  Filled 2021-12-05 (×2): qty 100

## 2021-12-05 MED ORDER — CLOPIDOGREL BISULFATE 75 MG PO TABS
75.0000 mg | ORAL_TABLET | Freq: Every day | ORAL | Status: AC
  Administered 2021-12-05: 75 mg via ORAL
  Filled 2021-12-05: qty 1

## 2021-12-05 NOTE — Progress Notes (Signed)
Speech Language Pathology Discharge Summary  Patient Details  Name: Hayley Bryant MRN: [id number] Date of Birth: Oct 18, 1970  Today's Date: 12/05/2021 SLP Individual Time: 2330-0762 SLP Individual Time Calculation (min): 44 min   Skilled Therapeutic Interventions:  Skilled ST services focused on cognitive skills. SLP facilitated reassessment of cognitive linguistic skills administering SLUMS, pt scored 22/30 (n=>27) with deficits primarily in word finding verse cognition. SLP provided memory and divergent naming handout, giving education in semantic feature analysis and recall strategies. All questions answered to satisfaction. Pt was left in room with call bell within reach and chair alarm set.   Patient has met 3 of 3 long term goals.  Patient to discharge at overall Supervision;Mod level.  Reasons goals not met:     Clinical Impression/Discharge Summary:   Pt made good progress meeting 3 out 3 goals. Pt is discharging at mod A for expression and speech intelligibility. Pt demonstrated deficits in word finding at phrase/sentence level, sentence reading comprehension, reduced speech intelligibility due to reduced vocal intensity, reduced breath support and misarticulation. Pt demonstrates deficits in following complex commands, basic-mildly complex problem solving, error awareness, and memory. Pt demonstrated improvements in cognitive linguistic function scoring 17/30 on SLUMS improving to 22/30 (n=>27.) Pt's barriers at discharge are speech intelligibility, expressive language, complex receptive language, problem solving , emergent and recall. Family has been improved in treatment session and education is completed. Pt benefited from skilled ST services in order to maximize functional independence and reduce burden of care, requiring 24 hour supervision at discharge with continued skilled ST services.  Care Partner:  Caregiver Able to Provide Assistance: Yes  Type of Caregiver Assistance:  Physical;Cognitive  Recommendation:  Outpatient SLP;24 hour supervision/assistance  Rationale for SLP Follow Up: Maximize functional communication;Maximize cognitive function and independence;Reduce caregiver burden   Equipment: N/A   Reasons for discharge: Discharged from hospital   Patient/Family Agrees with Progress Made and Goals Achieved: Yes    Rajanee Schuelke 12/05/2021, 1:02 PM

## 2021-12-05 NOTE — Progress Notes (Signed)
Occupational Therapy Session Note  Patient Details  Name: Hayley Bryant MRN: [id number] Date of Birth: 1971-05-17  Today's Date: 12/05/2021 OT Individual Time: 1300-1408 OT Individual Time Calculation (min): 68 min    Short Term Goals: Week 2:  OT Short Term Goal 1 (Week 2): STG=LTG  Skilled Therapeutic Interventions/Progress Updates:   Pt seated in recliner upon OT arrival to the room. Pt reports, "I just did a wash-up earlier." Pt in agreement for OT session. Pt participates in ADL session in order to improve independence with ADLs needed for DC home and to prepare for DC.  Therapy Documentation Precautions:  Precautions Precautions: Fall Precaution Comments: R hemiparesis UE>LE Restrictions Weight Bearing Restrictions: No Vital Signs:  See "Flowsheet" for most recent vitals taken by nursing staff.  Pain: Pain Assessment Pain Score: 0-No pain  ADL: Upper Body Bathing: Supervision/safety, Minimal cueing (Pt able to bathe all UB parts with supervision and minimal VCs for sequencing/thoroughness while seated on the shower chair.) Where Assessed-Upper Body Bathing: Shower Lower Body Bathing: Contact guard, Minimal cueing (Pt requires incidental touching to ensure thoroughness with bathing buttocks. However, pt able to bathe all other body parts while seated on the shower chair. Pt requires VCs to use comp techniques while seated.) Where Assessed-Lower Body Bathing: Shower (Pt able to doff and don a pull-over style shirt and bra with incidental touching to pull clothing down in back.) Upper Body Dressing: Contact guard Where Assessed-Upper Body Dressing:  (shower chair and toilet to prep/after shower) Lower Body Dressing: Minimal cueing, Minimal assistance (Pt requires minimal assistance for fulling pulling clothing up in the back over buttocks and prompting to utilize comp techniques ot improve safety/independence with task.) Where Assessed-Lower Body Dressing:  (shower chair &  toilet to prep/after shower) Toileting: Minimal assistance (Pt requires minimal assistance to fully pull clothes up and to ensure thoroughness for hygiene after a BM.) Where Assessed-Toileting: Glass blower/designer: Close supervision (Pt able to complete ambulatory transfer with close supervision for safety and use of grab bars for controlled descend/ascend during transfer.) Toilet Transfer Method: Counselling psychologist: Energy manager: Close supervision (Pt able to complete ambulatory transfer with close supervision for safety.) Social research officer, government Method: Heritage manager: Shower seat with back ADL Comments: Pt able to complete various ambulatory transfers to complete ADLs from recliner > shower chair > toilet > chair > toilet (pt needed to void again) > to chair all with close supervision for afety. Pt participates well during ADL session and taking a shower to prepare for DC home tomorrow since pt reported only completing a "wash up at the sink". Pt does requires minimal prompts to encourage highest level of independence that is safe. Pt requires increased time to complete ADLs.   Education provided to pt on importance of involving RUE into functional tasks and attempting to perform ADLs with highest level of independence while maintaining safety. Pt verbalizes understanding.   Pt requested to stay in the recliner at end of session. Pt left sitting comfortably in the w/c with personal belongings and call light within reach, belt alarm placed and activated, and comfort needs attended to.   Therapy/Group: Individual Therapy  Barbee Shropshire 12/05/2021, 2:12 PM

## 2021-12-05 NOTE — TOC Benefit Eligibility Note (Signed)
Patient Teacher, English as a foreign language completed.    The patient is currently admitted and upon discharge could be taking Jardiance 10 mg.  The current 30 day co-pay is, $264.25 due to a $150.00 deductible.   The patient is insured through [place] [place] Patient Advocate Specialist [place] Patient Advocate Team Direct Number: [phone]  Fax: [phone]

## 2021-12-05 NOTE — Plan of Care (Signed)
  Problem: RH Balance Goal: LTG: Patient will maintain dynamic sitting balance (OT) Description: LTG:  Patient will maintain dynamic sitting balance with assistance during activities of daily living (OT) Outcome: Completed/Met   Problem: RH Balance Goal: LTG: Patient will maintain dynamic sitting balance (OT) Description: LTG:  Patient will maintain dynamic sitting balance with assistance during activities of daily living (OT) Outcome: Completed/Met Goal: LTG Patient will maintain dynamic standing with ADLs (OT) Description: LTG:  Patient will maintain dynamic standing balance with assist during activities of daily living (OT)  Outcome: Completed/Met   Problem: Sit to Stand Goal: LTG:  Patient will perform sit to stand in prep for activites of daily living with assistance level (OT) Description: LTG:  Patient will perform sit to stand in prep for activites of daily living with assistance level (OT) Outcome: Completed/Met   Problem: RH Eating Goal: LTG Patient will perform eating w/assist, cues/equip (OT) Description: LTG: Patient will perform eating with assist, with/without cues using equipment (OT) Outcome: Completed/Met   Problem: RH Grooming Goal: LTG Patient will perform grooming w/assist,cues/equip (OT) Description: LTG: Patient will perform grooming with assist, with/without cues using equipment (OT) Outcome: Completed/Met   Problem: RH Bathing Goal: LTG Patient will bathe all body parts with assist levels (OT) Description: LTG: Patient will bathe all body parts with assist levels (OT) Outcome: Completed/Met   Problem: RH Dressing Goal: LTG Patient will perform upper body dressing (OT) Description: LTG Patient will perform upper body dressing with assist, with/without cues (OT). Outcome: Completed/Met Goal: LTG Patient will perform lower body dressing w/assist (OT) Description: LTG: Patient will perform lower body dressing with assist, with/without cues in positioning  using equipment (OT) Outcome: Completed/Met   Problem: RH Toileting Goal: LTG Patient will perform toileting task (3/3 steps) with assistance level (OT) Description: LTG: Patient will perform toileting task (3/3 steps) with assistance level (OT)  Outcome: Completed/Met   Problem: RH Toilet Transfers Goal: LTG Patient will perform toilet transfers w/assist (OT) Description: LTG: Patient will perform toilet transfers with assist, with/without cues using equipment (OT) Outcome: Completed/Met   Problem: RH Tub/Shower Transfers Goal: LTG Patient will perform tub/shower transfers w/assist (OT) Description: LTG: Patient will perform tub/shower transfers with assist, with/without cues using equipment (OT) Outcome: Completed/Met   Problem: RH Simple Meal Prep Goal: LTG Patient will perform simple meal prep w/assist (OT) Description: LTG: Patient will perform simple meal prep with assistance, with/without cues (OT). Outcome: Not Applicable  (pt stated she does not do any meal prep at home. All meal prep is completed by her spouse and daughters)

## 2021-12-05 NOTE — Plan of Care (Signed)
Care plan resolved.

## 2021-12-05 NOTE — Progress Notes (Signed)
Occupational Therapy Discharge Summary  Patient Details  Name: Hayley Bryant MRN: [id number] Date of Birth: 11/27/1970   Patient has met 12 of 12 long term goals due to improved activity tolerance, improved balance, ability to compensate for deficits, and improved coordination.  Patient to discharge at overall Supervision level.  Patient's care partner is independent to provide the necessary physical and cognitive assistance at discharge.    Formal education with family not completed as husband had to leave for work in the am on her last day.  Her spouse has been present daily and observed pt moving as have her daughters.  On pt's phone, made a 5 min video for her to show her family of how she performs tub bench transfers, how to set up bench, etc.    Reasons goals not met: n/a  -meal prep goal not applicable as pt does not do this task at home  Recommendation:  Patient will benefit from ongoing skilled OT services in outpatient setting to continue to advance functional skills in the area of BADL and iADL.  Equipment: No equipment provided - family provided with print out of tub bench that they will order on their own.  Reasons for discharge: treatment goals met  Patient/family agrees with progress made and goals achieved: Yes  OT Discharge Precautions/Restrictions  Precautions Precautions: Fall Precaution Comments: R hemiparesis UE>LE Restrictions Weight Bearing Restrictions: No  ADL ADL Eating: Independent Where Assessed-Eating: Chair Grooming: Supervision/safety Where Assessed-Grooming: Standing at sink, Sitting at sink (pt often gets fatigued and requests to sit) Upper Body Bathing: Supervision/safety Where Assessed-Upper Body Bathing: Shower Lower Body Bathing: Supervision/safety Where Assessed-Lower Body Bathing: Shower Upper Body Dressing: Supervision/safety Where Assessed-Upper Body Dressing: Chair Lower Body Dressing: Supervision/safety Where Assessed-Lower  Body Dressing: Chair Toileting: Supervision/safety Where Assessed-Toileting: Glass blower/designer: Close supervision Toilet Transfer Method: Counselling psychologist: Energy manager: Close supervison Clinical cytogeneticist Method: Optometrist: Facilities manager: Distant supervision Social research officer, government Method: Heritage manager: Civil engineer, contracting with back, Grab bars Vision Baseline Vision/History: 1 Wears glasses Patient Visual Report: No change from baseline Vision Assessment?: Vision impaired- to be further tested in functional context Eye Alignment: Within Functional Limits Ocular Range of Motion: Within Functional Limits Alignment/Gaze Preference: Within Defined Limits Tracking/Visual Pursuits: Decreased smoothness of horizontal tracking;Unable to hold eye position out of midline Perception  Perception: Impaired Inattention/Neglect: Does not attend to right visual field;Other (comment) Praxis Praxis: Impaired Praxis Impairment Details: Motor planning Praxis-Other Comments: improved from eval but continues to need mod cues with donning her pullover bra, donning pants, etc Cognition Cognition Overall Cognitive Status: Impaired/Different from baseline Arousal/Alertness: Awake/alert Memory: Impaired Memory Impairment: Decreased recall of new information Attention: Sustained;Selective Sustained Attention: Appears intact Selective Attention: Impaired Awareness: Impaired Awareness Impairment: Emergent impairment;Anticipatory impairment Problem Solving: Impaired Problem Solving Impairment: Functional basic;Verbal basic Safety/Judgment: Impaired Brief Interview for Mental Status (BIMS) Repetition of Three Words (First Attempt): 3 Temporal Orientation: Year: Correct Temporal Orientation: Month: Accurate within 5 days Temporal Orientation: Day: Correct Recall: "Sock": Yes, no cue  required Recall: "Blue": Yes, no cue required Recall: "Bed": Yes, no cue required BIMS Summary Score: 15 Sensation Sensation Light Touch: Appears Intact Hot/Cold: Appears Intact Proprioception: Impaired Detail Proprioception Impaired Details: Impaired RUE Stereognosis: Impaired by gross assessment Coordination Gross Motor Movements are Fluid and Coordinated: No Fine Motor Movements are Fluid and Coordinated: No Coordination and Movement Description: Brunnstrum stage IV in the right arm and hand.  Needs min  to mod assist to integrate as an active assist in bathing and dressing tasks. Finger Nose Finger Test: decreased accuracy bilaterally with R more impaired then L. 9 Hole Peg Test: Administered the Nine Hole Peg Test (NHPT)  with the following results:  -R:3 min 46 seconds, required assist from L hand to achieve pincer grasp in order to insert pegs( 37-45 seconds is considered below average ; greater than 45 seconds is considered poor) thus demonstrating deficits in fine motor skills, hand dexterity, and speed. Motor  Motor Motor: Hemiplegia Motor - Discharge Observations: right hemiplegia with UE affected greater than LE Mobility  Transfers Sit to Stand: Supervision/Verbal cueing  Trunk/Postural Assessment  Cervical Assessment Cervical Assessment: Within Functional Limits Thoracic Assessment Thoracic Assessment: Within Functional Limits Lumbar Assessment Lumbar Assessment: Within Functional Limits Postural Control Postural Control: Within Functional Limits  Balance Static Sitting Balance Static Sitting - Level of Assistance: 6: Modified independent (Device/Increase time) Dynamic Sitting Balance Dynamic Sitting - Level of Assistance: 6: Modified independent (Device/Increase time) Static Standing Balance Static Standing - Level of Assistance: 6: Modified independent (Device/Increase time) Dynamic Standing Balance Dynamic Standing - Level of Assistance: 5: Stand by  assistance Extremity/Trunk Assessment RUE Assessment Passive Range of Motion (PROM) Comments: WFLS Active Range of Motion (AROM) Comments: WFLs for shoulder and elbow.  Noted pt with decreased gross digit extension at 70% and flexion at 90%.  Decreased ability to oppose thumb to 2-5 digits.  Second digit remains in extension at rest General Strength Comments: Brunnstrum stage IV in the arm and hand for functional movement.  Min assist is needed for integration as an active assist during selfcare tasks such as applying toothpaste to brush, pulling up R side of pants, etc.  Supervision for use as a gross assist to hold items such as the soap or deodorant when opening them. LUE Assessment LUE Assessment: Within Functional Limits   SAGUIER,JULIA 12/05/2021, 12:44 PM & Hayley Shropshire, MS OTR/l 12/05/2021, 16:33

## 2021-12-05 NOTE — Discharge Summary (Signed)
Physical Therapy Discharge Summary  Patient Details  Name: Hayley Bryant MRN: [id number] Date of Birth: 11-Feb-1971  Today's Date: 12/05/2021 PT Individual Time: 6314-9702 PT Individual Time Calculation (min): 47 min    Patient has met 9 of 9 long term goals due to improved activity tolerance, improved balance, increased strength, functional use of  right upper extremity and right lower extremity, improved attention, and improved awareness.  Patient to discharge at an ambulatory level Supervision.   Patient's care partner is independent to provide the necessary physical and cognitive assistance at discharge.  All goals met.  Recommendation:  Patient will benefit from ongoing skilled PT services in home health setting to continue to advance safe functional mobility, address ongoing impairments in strength, coordination, balance, activity tolerance, cognition, safety awareness, and minimize fall risk.  Equipment: RW  Reasons for discharge: treatment goals met and discharge from hospital  Patient/family agrees with progress made and goals achieved: Yes  Skilled Therapeutic Interventions/Progress Updates:  Pt received supported upright in bed having finished breakfast and pt agreeable to therapy session. Pt requesting to get washed up and changed into her clothes. Sit<>stands, no AD, supervision progressing towards increasing independence during session. Pt ambulated in room to collect her clothes, no AD, with supervision - continues to have slow gait speed with short B LE step lengths. Performed UB and LB hygiene with set-up assist. Donned LB clothing with increased time and cuing for increased use of RUE - pt requiring verbal confirmation that she had her pants oriented correctly. Donned shirt with mod assist to orient shirt correctly and start threading her arms but then pt able to complete donning without assist. MD in/out for morning assessment to look at the rash on pt's L axillary/anterior  chest area and notified of pt reporting L arm pain preventing her from tolerating lying on that side at night.  Transported to/from gym in w/c for time management to perform balance assessment.  Patient participated in [facility] and demonstrates increased fall risk as noted by score of 48/56.  (<36= high risk for falls, close to 100%; 37-45 significant >80%; 46-51 moderate >50%; 52-55 lower >25%).  At end of session, pt left seated in w/c with needs in reach and seat belt alarm on.   PT Discharge Precautions/Restrictions Precautions Precautions: Fall Precaution Comments: R hemiparesis UE>LE Restrictions Weight Bearing Restrictions: No Pain Pain Assessment Pain Scale: 0-10 Pain Score: 0-No pain Pain Interference Pain Interference Pain Effect on Sleep: 1. Rarely or not at all Pain Interference with Therapy Activities: 1. Rarely or not at all Pain Interference with Day-to-Day Activities: 1. Rarely or not at all Vision/Perception  Vision - History Ability to See in Adequate Light: 0 Adequate Vision - Assessment Eye Alignment: Within Functional Limits Ocular Range of Motion: Impaired-to be further tested in functional context Alignment/Gaze Preference: Within Defined Limits Perception Perception: Impaired Inattention/Neglect: Does not attend to right visual field;Other (comment) (requires time to motor plan R hand movements) Praxis Praxis: Impaired Praxis Impairment Details: Motor planning Praxis-Other Comments: improved from eval but does need time to motor plan for R hand  Cognition Overall Cognitive Status: Within Functional Limits for tasks assessed Arousal/Alertness: Awake/alert Orientation Level: Oriented X4 Attention: Selective Selective Attention: Impaired Memory: Impaired Memory Impairment: Decreased recall of new information Awareness: Impaired Awareness Impairment: Emergent impairment;Anticipatory impairment Problem Solving: Impaired Safety/Judgment:  Impaired Sensation Sensation Light Touch: Appears Intact Proprioception Impaired Details:  (difficult to assess) Coordination Gross Motor Movements are Fluid and Coordinated: No Heel Vernard Gambles  Test: decreased bil: speed, accuracy and excursion; improved from eval Motor  Motor Motor: Hemiplegia Motor - Discharge Observations: right hemiplegia with UE affected greater than LE  Mobility Bed Mobility Rolling Right: Independent with assistive device Rolling Left: Independent with assistive device Supine to Sit: Independent with assistive device Sit to Supine: Independent with assistive device Transfers Transfers: Sit to Stand;Stand to Sit;Stand Pivot Transfers Sit to Stand: Supervision/Verbal cueing Stand to Sit: Independent with assistive device Stand Pivot Transfers: Supervision/Verbal cueing Transfer (Assistive device): None Locomotion  Gait Ambulation: Yes Gait Assistance: Supervision/Verbal cueing Gait Distance (Feet): 200 Feet Assistive device: None Gait Gait: Yes Gait Pattern: Impaired Gait Pattern: Step-through pattern;Decreased trunk rotation;Narrow base of support;Decreased hip/knee flexion - right;Trunk rotated posteriorly on left (lacks push off bil) Stairs / Additional Locomotion Stairs: Yes Stairs Assistance: Contact Guard/Touching assist Stair Management Technique: One rail Right Number of Stairs: 13 Height of Stairs: 6 (and 3) Ramp: Supervision/Verbal cueing Curb: Contact Guard/Touching assist Pick up small object from the floor assist level: Supervision/Verbal cueing Pick up small object from the floor assistive device: none Wheelchair Mobility Wheelchair Mobility: Yes Wheelchair Assistance: Development worker, international aid: Both upper extremities;Both lower extermities Wheelchair Parts Management: Needs assistance Distance: 162f  Trunk/Postural Assessment  Cervical Assessment Cervical Assessment: Within Functional Limits (at rest, pt  tends to rotate head slightly L) Thoracic Assessment Thoracic Assessment: Within Functional Limits Lumbar Assessment Lumbar Assessment: Within Functional Limits Postural Control Postural Control: Within Functional Limits  Balance Balance Balance Assessed: Yes Standardized Balance Assessment Standardized Balance Assessment: Berg Balance Test Berg Balance Test Sit to Stand: Able to stand without using hands and stabilize independently Standing Unsupported: Able to stand safely 2 minutes Sitting with Back Unsupported but Feet Supported on Floor or Stool: Able to sit safely and securely 2 minutes Stand to Sit: Sits safely with minimal use of hands Transfers: Able to transfer safely, minor use of hands Standing Unsupported with Eyes Closed: Able to stand 10 seconds safely Standing Ubsupported with Feet Together: Able to place feet together independently and stand 1 minute safely From Standing, Reach Forward with Outstretched Arm: Can reach confidently >25 cm (10") From Standing Position, Pick up Object from Floor: Able to pick up shoe safely and easily From Standing Position, Turn to Look Behind Over each Shoulder: Looks behind from both sides and weight shifts well Turn 360 Degrees: Able to turn 360 degrees safely but slowly Standing Unsupported, Alternately Place Feet on Step/Stool: Able to complete 4 steps without aid or supervision Standing Unsupported, One Foot in Front: Able to plae foot ahead of the other independently and hold 30 seconds Standing on One Leg: Tries to lift leg/unable to hold 3 seconds but remains standing independently Total Score: 48 Static Sitting Balance Static Sitting - Balance Support: Feet supported Static Sitting - Level of Assistance: 6: Modified independent (Device/Increase time) Dynamic Sitting Balance Dynamic Sitting - Balance Support: Feet supported Dynamic Sitting - Level of Assistance: 6: Modified independent (Device/Increase time) Static Standing  Balance Static Standing - Balance Support: During functional activity;Right upper extremity supported Static Standing - Level of Assistance: 6: Modified independent (Device/Increase time) Dynamic Standing Balance Dynamic Standing - Balance Support: No upper extremity supported;During functional activity Dynamic Standing - Level of Assistance: 5: Stand by assistance Dynamic Standing - Balance Activities: Reaching for objects;Reaching across midline Extremity Assessment      RLE Assessment RLE Assessment: Exceptions to [facility] Range of Motion (AROM) Comments: WFL/WNL RLE Strength Right Hip Flexion: 4+/5 Right Knee Flexion:  4+/5 Right Knee Extension: 5/5 Right Ankle Dorsiflexion: 4+/5 Right Ankle Plantar Flexion: 4+/5 LLE Assessment LLE Assessment: Within Functional Limits Active Range of Motion (AROM) Comments: WFL/WNL General Strength Comments: grossly 5/5   Page Spiro, PT, DPT  Alger Simons 12/04/2021, 5:41 PM

## 2021-12-05 NOTE — Progress Notes (Signed)
PROGRESS NOTE   Subjective/Complaints:  Rash appears improved this AM.  She reports some pain at L shoulder at night, she has had this prior to admission at times.   ROS: Negative for fever, cough, CP, SOB, N/V/C/D. + weakness and myalgia. Rash -improved  Objective:   No results found. No results for input(s): "WBC", "HGB", "HCT", "PLT" in the last 72 hours.   No results for input(s): "NA", "K", "CL", "CO2", "GLUCOSE", "BUN", "CREATININE", "CALCIUM" in the last 72 hours.    Intake/Output Summary (Last 24 hours) at 12/05/2021 0754 Last data filed at 12/04/2021 1806 Gross per 24 hour  Intake 472 ml  Output --  Net 472 ml         Physical Exam: Vital Signs Blood pressure 105/69, pulse 70, temperature 98.9 F (37.2 C), temperature source Oral, resp. rate 18, height '5\' 7"'$  (1.702 m), weight 74.1 kg, SpO2 99 %.    General: Alert and oriented x 3, No apparent distress HEENT: Head is normocephalic, atraumatic, PERRLA, EOMI, sclera anicteric, oral mucosa pink and moist Neck: Supple without JVD or lymphadenopathy Heart: Reg rate and rhythm. No murmurs rubs or gallops Chest: CTA bilaterally without wheezes, rales, or rhonchi, good air movement Abdomen: Soft, non-tender, non-distended, bowel sounds positive. Extremities: No clubbing, cyanosis, or edema. Pulses are 2+ Psych: Pt's affect is appropriate. Pt is cooperative Skin: Clean and intact without signs of breakdown. Small areas of erythematous rash around L axilla, upper back Neuro:  Sensation intact to LT.  Mod dysarthria but understandable speech.  Follows commands Musculoskeletal: Strength 4/5 left side, 3/5 right side. No significant pain with shoulder abduction, minimal tenderness at shoulder    Assessment/Plan: 1. Functional deficits which require 3+ hours per day of interdisciplinary therapy in a comprehensive inpatient rehab setting. Physiatrist is providing close  team supervision and 24 hour management of active medical problems listed below. Physiatrist and rehab team continue to assess barriers to discharge/monitor patient progress toward functional and medical goals  Care Tool:  Bathing    Body parts bathed by patient: Chest, Abdomen, Front perineal area, Buttocks, Face, Left lower leg, Right lower leg, Left upper leg, Right upper leg, Right arm   Body parts bathed by helper: Left arm     Bathing assist Assist Level: Minimal Assistance - Patient > 75%     Upper Body Dressing/Undressing Upper body dressing   What is the patient wearing?: Pull over shirt, Bra    Upper body assist Assist Level: Moderate Assistance - Patient 50 - 74%    Lower Body Dressing/Undressing Lower body dressing      What is the patient wearing?: Underwear/pull up, Pants     Lower body assist Assist for lower body dressing: Moderate Assistance - Patient 50 - 74%     Toileting Toileting    Toileting assist Assist for toileting: Minimal Assistance - Patient > 75%     Transfers Chair/bed transfer  Transfers assist     Chair/bed transfer assist level: Supervision/Verbal cueing     Locomotion Ambulation   Ambulation assist      Assist level: Supervision/Verbal cueing Assistive device: Walker-rolling Max distance: 190   Walk 10 feet activity  Assist     Assist level: Contact Guard/Touching assist Assistive device: No Device   Walk 50 feet activity   Assist    Assist level: Supervision/Verbal cueing Assistive device: Walker-rolling    Walk 150 feet activity   Assist    Assist level: Supervision/Verbal cueing Assistive device: Walker-rolling    Walk 10 feet on uneven surface  activity   Assist     Assist level: Minimal Assistance - Patient > 75% Assistive device: Hand held assist   Wheelchair     Assist Is the patient using a wheelchair?: Yes Type of Wheelchair: Manual    Wheelchair assist level: Maximal  Assistance - Patient 25 - 49% Max wheelchair distance: 50    Wheelchair 50 feet with 2 turns activity    Assist        Assist Level: Maximal Assistance - Patient 25 - 49%   Wheelchair 150 feet activity     Assist  Wheelchair 150 feet activity did not occur:  (R hand inattention)   Assist Level: Maximal Assistance - Patient 25 - 49% (based on PT eval documentation)   Blood pressure 105/69, pulse 70, temperature 98.9 F (37.2 C), temperature source Oral, resp. rate 18, height '5\' 7"'$  (1.702 m), weight 74.1 kg, SpO2 99 %.  Medical Problem List and Plan: 1. 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  Antithrombotics: -DVT/anticoagulation:  Pharmaceutical: Heparin             -antiplatelet therapy: Aspirin 81 mg daily and Plavix 75 mg daily for 3 weeks then aspirin alone 3. Pain Management: Tylenol 6/24 Pain reported in rt knee today.  Continue use of Tylenol. 7/7 voltaren gel shoulder pain 4. Mood/Sleep: LCSW to evaluate and provide emotional support             -antipsychotic agents: n/a 5. Neuropsych/cognition: This patient is not capable of making decisions on her own behalf. 6. Skin/Wound Care: Routine skin care checks 7. Fluids/Electrolytes/Nutrition: Routine I's and O's and follow-up chemistries 8. Hypertension: Continue lisinopril/HCTZ 7/6 continues to be well controlled overall    12/05/2021    5:33 AM 12/04/2021    8:01 PM 12/04/2021    2:14 PM  Vitals with BMI  Systolic [phone]  Diastolic 69 82 98  Pulse 70 74 71    9. Depression continue fluoxetine 20 mg daily 10. Diabetes mellitus-2: A1c 7.7; monitor CBGs             -- SSI before every meal and nightly             -- Continue Semglee 17 units daily -Home meds include Jardiance and Trulicity 8/85 Restarted on home Jardiance 10 mg 7/6 CBGs with good control, continue current  meds  CBG (last 3)  Recent Labs    12/04/21 1631 12/04/21 2054 12/04/21 2244  GLUCAP 151* 134* 139*     11. Hyperlipidemia: Continue atorvastatin 40 mg daily 12.Anemia: Repeat hgb tomorrow.  6/25 Hgb stable at 9.6, trend with weekly labs.  13. Hypoalbuminemia -mild              -eating most of meals 7/2 Eating well, follow with weekly labs 14. Headache  -Improved, cont tylenol 15.  Rash, suspecting contact dermatitis  -not itching or painful, continue to monitor, avoid further exposure to new soaps or other products, can try topical steroid if worsens  -7/6 improved, continue to monitor  LOS: 13 days A FACE TO [place] 12/05/2021, 7:54 AM

## 2021-12-05 NOTE — Progress Notes (Signed)
Patient ID: Hayley Bryant, female   DOB: 06-18-1970, 51 y.o.   MRN: [id number]  Received insurance form from [employer] have faxed STD form to the [facility] and have given the pt the originals back. Pt feels ready to go home tomorrow. Rolling walker is in her room for home. Will get tub bench on her own.

## 2021-12-05 NOTE — Progress Notes (Signed)
Inpatient Rehabilitation Discharge Medication Review by a Pharmacist  A complete drug regimen review was completed for this patient to identify any potential clinically significant medication issues.  High Risk Drug Classes Is patient taking? Indication by Medication  Antipsychotic No   Anticoagulant No   Antibiotic No   Opioid No   Antiplatelet Yes Aspirin - CVA ppx  Hypoglycemics/insulin Yes Insulin glargine, empagliflozin, dulaglutide - DM  Vasoactive Medication Yes HCTZ/lisinopril - HTN  Chemotherapy No   Other Yes Atorvastatin - HLD Diclofenac - topical pain Fluoxetine - mood Pantoprazole - GERD ppx Trazodone - PRN sleep     Type of Medication Issue Identified Description of Issue Recommendation(s)  Drug Interaction(s) (clinically significant)     Duplicate Therapy     Allergy     No Medication Administration End Date     Incorrect Dose     Additional Drug Therapy Needed     Significant med changes from prior encounter (inform family/care partners about these prior to discharge). PTA Medications discontinued: - ibuprofen, naphazoline/pheniramine  Communicate medication changes with patient/family at discharge  Other       Clinically significant medication issues were identified that warrant physician communication and completion of prescribed/recommended actions by midnight of the next day:  No   Pharmacist comments: n/a  Time spent performing this drug regimen review (minutes): 20   Thank you for allowing pharmacy to be a part of this patient's care.  Ardyth Harps, PharmD Clinical Pharmacist

## 2021-12-05 NOTE — Progress Notes (Signed)
Occupational Therapy Session Note  Patient Details  Name: Hayley Bryant MRN: [id number] Date of Birth: 12-11-1970  Today's Date: 12/05/2021 OT Individual Time: 1517-6160 OT Individual Time Calculation (min): 105 min    Short Term Goals: Week 2:  OT Short Term Goal 1 (Week 2): STG=LTG  Skilled Therapeutic Interventions/Progress Updates:    Pt received in w/c and agreeable to working on self care skills.  Her husband was present at the start of the session, but had to leave for work.  I let him know that I would leave him a print out of the type of bench he should order.   Obtained elastic shoes laces and a button hook for pt to practice with.  She is supervision with bathing and UB dressing but continues to need cues with using RUE and fully attending to her R side.   With the elastic laces, she can don her shoes without A (as well as LB clothing).  She continues to have great difficulty with tying her pants or fastening buttons and zippers.  Pt practiced using the button hook and zipper pull for several minutes but found it frustrating and said " I cant use this".   Discussed having her family purchase pull on elastic pants which will totally eliminate these struggles for her.   Pt ambulated to the toilet and toileted with S (except for A tying bow in pants strap).   She stood at sink to wash hands. Pt requested to sit to brush teeth. I recommended she practice standing as she will at home. Pt stood for just a minute and then said she needed to sit.   Sat to complete oral care with set up.  Pt then ambulated to tub room with RW with S but stated her home tub is in opposite direction. She took a brief break sitting in that bathroom before ambulating to ADL apt.  In apt, pt practiced sitting and standing from very low couch with S and then walked to bathroom to do tub bench transfers.    Because her family had not seen these transfers, used pt's phone to make a 5 min video of pt completing  transfers, along with safety recommendations about how to set up bench, use of grab bar, how to adjust curtain to avoid water spillage.  Pt will show the video to her husband tonight.    Pt ambulated back to her room and sat in recliner.  Pt then needed to toilet. Toileted a 2nd time this session with supervision.    Spent time talking with pt about "trying" first before saying "I cant do it!" And asking for help.  Encouraged her to put her best effort in with use of her RUE. Pt agreed.    Pt resting in recliner with belt alarm on and all needs met.   Therapy Documentation Precautions:  Precautions Precautions: Fall Precaution Comments: R hemiparesis UE>LE Restrictions Weight Bearing Restrictions: No  Vital Signs: Therapy Vitals Temp: 98.9 F (37.2 C) Temp Source: Oral Pulse Rate: 70 Resp: 18 BP: 105/69 Patient Position (if appropriate): Lying Oxygen Therapy SpO2: 99 % O2 Device: Room Air Pain: Pain Assessment Pain Scale: 0-10 Pain Score: 0-No pain ADL: ADL Eating: Set up Where Assessed-Eating: Chair Grooming: Supervision/safety Upper Body Bathing: Minimal cueing Where Assessed-Upper Body Bathing: Shower Lower Body Bathing: Minimal assistance Where Assessed-Lower Body Bathing: Shower Upper Body Dressing: Moderate assistance Where Assessed-Upper Body Dressing: Chair Lower Body Dressing: Moderate assistance Toileting: Minimal assistance (Pt able to  participate in all portions of task, however, requires assistance to fully pull pants up over buttocks.) Where Assessed-Toileting: Glass blower/designer: Contact guard (Pt able to complete ambulatory transfer with CGA for safe descend onto toilet with use of FWW and grab bars.) Toilet Transfer Method: Counselling psychologist: Energy manager: Environmental education officer Method: Heritage manager: Civil engineer, contracting with back, Grab bars Vision Baseline  Vision/History: 1 Wears glasses Patient Visual Report: No change from baseline Vision Assessment?: Vision impaired- to be further tested in functional context (Needs head turns/ eye rotation to attend to objects on R side.) Eye Alignment: Within Functional Limits Ocular Range of Motion: Impaired-to be further tested in functional context Alignment/Gaze Preference: Within Defined Limits Perception  Perception: Impaired Inattention/Neglect: Does not attend to right visual field;Other (comment) (requires time to motor plan R hand movements) Praxis Praxis: Impaired Praxis Impairment Details: Motor planning Praxis-Other Comments: improved from eval but does need time to motor plan for R hand Balance Balance Balance Assessed: Yes Standardized Balance Assessment Standardized Balance Assessment: Berg Balance Test Berg Balance Test Sit to Stand: Able to stand without using hands and stabilize independently Standing Unsupported: Able to stand safely 2 minutes Sitting with Back Unsupported but Feet Supported on Floor or Stool: Able to sit safely and securely 2 minutes Stand to Sit: Sits safely with minimal use of hands Transfers: Able to transfer safely, minor use of hands Standing Unsupported with Eyes Closed: Able to stand 10 seconds safely Static Sitting Balance Static Sitting - Balance Support: Feet supported Static Sitting - Level of Assistance: 6: Modified independent (Device/Increase time) Dynamic Sitting Balance Dynamic Sitting - Balance Support: Feet supported Dynamic Sitting - Level of Assistance: 6: Modified independent (Device/Increase time) Static Standing Balance Static Standing - Balance Support: During functional activity;Right upper extremity supported Static Standing - Level of Assistance: 6: Modified independent (Device/Increase time) Dynamic Standing Balance Dynamic Standing - Balance Support: No upper extremity supported;During functional activity Dynamic Standing -  Level of Assistance: 5: Stand by assistance Dynamic Standing - Balance Activities: Reaching for objects;Reaching across midline  Therapy/Group: Individual Therapy  [place] 12/05/2021, 8:47 AM

## 2021-12-05 NOTE — Telephone Encounter (Signed)
Pharmacy Patient Advocate Encounter  Insurance verification completed.    The patient is insured through [employer]   The patient is currently admitted and ran test claims for the following: Jardiance 10 mg.  Copays and coinsurance results were relayed to Inpatient clinical team.

## 2021-12-06 DIAGNOSIS — M25512 Pain in left shoulder: Secondary | ICD-10-CM

## 2021-12-06 LAB — GLUCOSE, CAPILLARY: Glucose-Capillary: 103 mg/dL — ABNORMAL HIGH (ref 70–99)

## 2021-12-06 MED ORDER — DICLOFENAC SODIUM 1 % EX GEL: 2.0000 g | Freq: Four times a day (QID) | CUTANEOUS | 0 refills | Status: DC

## 2021-12-06 MED ORDER — ATORVASTATIN CALCIUM 40 MG PO TABS: 40.0000 mg | ORAL_TABLET | Freq: Every day | ORAL | 0 refills | Status: DC

## 2021-12-06 MED ORDER — ASPIRIN 81 MG PO TBEC: 81.0000 mg | DELAYED_RELEASE_TABLET | Freq: Every day | ORAL | 0 refills | Status: AC

## 2021-12-06 MED ORDER — FLUOXETINE HCL 20 MG PO CAPS: 60.0000 mg | ORAL_CAPSULE | Freq: Every day | ORAL | 0 refills | Status: AC

## 2021-12-06 MED ORDER — BASAGLAR KWIKPEN 100 UNIT/ML ~~LOC~~ SOPN: 15.0000 [IU] | PEN_INJECTOR | Freq: Every day | SUBCUTANEOUS | 0 refills | Status: AC

## 2021-12-06 MED ORDER — ACETAMINOPHEN 325 MG PO TABS
325.0000 mg | ORAL_TABLET | ORAL | Status: AC | PRN
Start: 2021-12-06 — End: ?

## 2021-12-06 MED ORDER — TRAZODONE HCL 50 MG PO TABS: 25.0000 mg | ORAL_TABLET | Freq: Every evening | ORAL | 0 refills | Status: DC | PRN

## 2021-12-06 MED ORDER — LISINOPRIL-HYDROCHLOROTHIAZIDE 20-25 MG PO TABS: 1.0000 | ORAL_TABLET | Freq: Every day | ORAL | 0 refills | Status: AC

## 2021-12-06 NOTE — Progress Notes (Signed)
PROGRESS NOTE   Subjective/Complaints:  R continues to improve. No new concerns this AM. Home today.   ROS: Negative for fever, HA, cough, CP, SOB, N/V/C/D. + weakness and myalgia. Rash -improved  Objective:   No results found. No results for input(s): "WBC", "HGB", "HCT", "PLT" in the last 72 hours.   No results for input(s): "NA", "K", "CL", "CO2", "GLUCOSE", "BUN", "CREATININE", "CALCIUM" in the last 72 hours.    Intake/Output Summary (Last 24 hours) at 12/06/2021 0751 Last data filed at 12/05/2021 1700 Gross per 24 hour  Intake 600 ml  Output --  Net 600 ml         Physical Exam: Vital Signs Blood pressure 117/74, pulse 67, temperature 98.2 F (36.8 C), temperature source Oral, resp. rate 18, height '5\' 7"'$  (1.702 m), weight 74.1 kg, SpO2 100 %.    General: Alert and oriented x 3, No apparent distress HEENT: Head is normocephalic, atraumatic, PERRLA, EOMI, sclera anicteric, oral mucosa pink and moist Neck: Supple without JVD or lymphadenopathy Heart: Reg rate and rhythm. No murmurs rubs or gallops Chest: CTA bilaterally without wheezes, rales, or rhonchi, good air movement Abdomen: Soft, non-tender, non-distended, bowel sounds positive. Extremities: No clubbing, cyanosis, or edema. Pulses are 2+ Psych: Pt's affect is appropriate. Pt is cooperative Skin: Clean and intact without signs of breakdown. Small areas of erythematous rash around L axilla, upper back- improved Neuro:  Sensation intact to LT.  Mod dysarthria but understandable speech.  Follows commands Musculoskeletal: Strength 4/5 left side, 3/5 right side. No significant pain with shoulder abduction, minimal tenderness at shoulder    Assessment/Plan: 1. Functional deficits which require 3+ hours per day of interdisciplinary therapy in a comprehensive inpatient rehab setting. Physiatrist is providing close team supervision and 24 hour management of active  medical problems listed below. Physiatrist and rehab team continue to assess barriers to discharge/monitor patient progress toward functional and medical goals  Care Tool:  Bathing    Body parts bathed by patient: Chest, Abdomen, Front perineal area, Buttocks, Face, Left lower leg, Right lower leg, Left upper leg, Right upper leg, Right arm, Left arm   Body parts bathed by helper: Left arm     Bathing assist Assist Level: Supervision/Verbal cueing     Upper Body Dressing/Undressing Upper body dressing   What is the patient wearing?: Pull over shirt, Bra    Upper body assist Assist Level: Supervision/Verbal cueing    Lower Body Dressing/Undressing Lower body dressing      What is the patient wearing?: Underwear/pull up, Pants     Lower body assist Assist for lower body dressing: Supervision/Verbal cueing (min A if her pants have buttons, advised pt to only use pull up pants)     Toileting Toileting    Toileting assist Assist for toileting: Supervision/Verbal cueing     Transfers Chair/bed transfer  Transfers assist     Chair/bed transfer assist level: Supervision/Verbal cueing     Locomotion Ambulation   Ambulation assist      Assist level: Supervision/Verbal cueing Assistive device: No Device Max distance: >190f   Walk 10 feet activity   Assist     Assist level: Supervision/Verbal cueing  Assistive device: No Device   Walk 50 feet activity   Assist    Assist level: Supervision/Verbal cueing Assistive device: No Device    Walk 150 feet activity   Assist    Assist level: Supervision/Verbal cueing Assistive device: No Device    Walk 10 feet on uneven surface  activity   Assist     Assist level: Supervision/Verbal cueing Assistive device: Hand held assist   Wheelchair     Assist Is the patient using a wheelchair?: No (pt not using nor getting a w/c at D/C) Type of Wheelchair: Manual    Wheelchair assist level: Maximal  Assistance - Patient 25 - 49% Max wheelchair distance: 50    Wheelchair 50 feet with 2 turns activity    Assist        Assist Level: Maximal Assistance - Patient 25 - 49%   Wheelchair 150 feet activity     Assist  Wheelchair 150 feet activity did not occur:  (R hand inattention)   Assist Level: Maximal Assistance - Patient 25 - 49% (based on PT eval documentation)   Blood pressure 117/74, pulse 67, temperature 98.2 F (36.8 C), temperature source Oral, resp. rate 18, height '5\' 7"'$  (1.702 m), weight 74.1 kg, SpO2 100 %.  Medical Problem List and Plan: 1. 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  Antithrombotics: -DVT/anticoagulation:  Pharmaceutical: Heparin             -antiplatelet therapy: Aspirin 81 mg daily and Plavix 75 mg daily for 3 weeks then aspirin alone 3. Pain Management: Tylenol 6/24 Pain reported in rt knee today.  Continue use of Tylenol. 7/7 voltaren gel shoulder pain- she first tried this today in the AM 4. Mood/Sleep: LCSW to evaluate and provide emotional support             -antipsychotic agents: n/a 5. Neuropsych/cognition: This patient is not capable of making decisions on her own behalf. 6. Skin/Wound Care: Routine skin care checks 7. Fluids/Electrolytes/Nutrition: Routine I's and O's and follow-up chemistries 8. Hypertension: Continue lisinopril/HCTZ 7/7 well controlled, f/u with PCP    12/06/2021    3:11 AM 12/05/2021    7:59 PM 12/05/2021    2:17 PM  Vitals with BMI  Systolic [phone]  Diastolic 74 81 68  Pulse 67 72 80    9. Depression continue fluoxetine 20 mg daily 10. Diabetes mellitus-2: A1c 7.7; monitor CBGs             -- SSI before every meal and nightly             -- Continue Semglee 17 units daily -Home meds include Jardiance and Trulicity 0/86 Restarted on home Jardiance 10 mg 7/7 well controlled,  f/u with PCP  CBG (last 3)  Recent Labs    12/05/21 1702 12/05/21 2017 12/06/21 0536  GLUCAP 179* 116* 103*     11. Hyperlipidemia: Continue atorvastatin 40 mg daily 12.Anemia: Repeat hgb tomorrow.  6/25 Hgb stable at 9.6, trend with weekly labs.  13. Hypoalbuminemia -mild              -eating most of meals 7/2 Eating well, follow with weekly labs 14. Headache  -Improved, cont  tylenol 15. Rash, suspecting contact dermatitis  -not itching or painful, continue to monitor, avoid further exposure to new soaps or other products, can try topical steroid if worsens  -7/7 continues to improve  LOS: 14 days A FACE TO FACE EVALUATION WAS PERFORMED  Jennye Boroughs 12/06/2021, 7:51 AM

## 2021-12-06 NOTE — Progress Notes (Signed)
Inpatient Rehabilitation Care Coordinator Discharge Note   Patient Details  Name: Hayley Bryant MRN: [id number] Date of Birth: [birth year]   Discharge location: [place] 24/7 SUPERVISION  Length of Stay: 14 DAYS  Discharge activity level: SUPERVISION LEVEL  Home/community participation: ACTIVE  Patient response LA:GTXMIW Literacy - How often do you need to have someone help you when you read instructions, pamphlets, or other written material from your doctor or pharmacy?: Never  Patient response OE:HOZYYQ Isolation - How often do you feel lonely or isolated from those around you?: Never  Services provided included: MD, RD, PT, OT, SLP, RN, CM, TR, Pharmacy, SW  Financial Services:  Financial Services Utilized: [place] offered to/list presented to: PT AND HUSBAND  Follow-up services arranged:  Outpatient, DME, Patient/Family has no preference for HH/DME agencies    Outpatient Servicies: [facility] PT, OT SP WILL CALL HUSBAND TO SET UP APPOINTMENTS DME : [facility]  WILL GET TUB BENCH ON OWN    Patient response to transportation need: Is the patient able to respond to transportation needs?: Yes In the past 12 months, has lack of transportation kept you from medical appointments or from getting medications?: No In the past 12 months, has lack of transportation kept you from meetings, work, or from getting things needed for daily living?: No    Comments (or additional information):PT DID WELL AND REACHED SUPERVISION LEVEL FAMILY AWARE OF NEED FOR [address] [place]. PAPERWORK FOR THE [place] FORMS FAXED AND GIVEN BACK TO PATIENT.  Patient/Family verbalized understanding of follow-up arrangements:  Yes  Individual responsible for coordination of the follow-up plan: Hayley Bryant-HUSBAND [phone]  Confirmed correct DME delivered: Hayley Bryant 12/06/2021    Atasha [place], [place]

## 2021-12-12 ENCOUNTER — Telehealth: Payer: Self-pay

## 2021-12-12 NOTE — Telephone Encounter (Signed)
Patients husband called stating he has not been able to get patient insulin yet. The pharmacy states it needs an override. I called  the pharmacy and the pharmacist states that the insurance will pay for the [place] but they will pay for Levemir, Lantus and Tresiba. I messaged Risa Grill, PA to let her know and she stated to have the PCP handle it. I called patient husband to let him and he stating she was going to the PCP today and will take the information to PCP.

## 2021-12-17 ENCOUNTER — Other Ambulatory Visit: Payer: Self-pay | Admitting: Physician Assistant

## 2021-12-18 NOTE — Therapy (Deleted)
OUTPATIENT OCCUPATIONAL THERAPY NEURO EVALUATION  Patient Name: Hayley Bryant MRN: [id number] DOB:December 23, 1970, 51 y.o., female Today's Date: 12/18/2021  PCP: Marland Kitchen REFERRING PROVIDER: Dr. 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  Rationale for Evaluation and Treatment {HABREHAB:27488}  SUBJECTIVE:   SUBJECTIVE STATEMENT: *** Pt accompanied by: {accompnied:27141}  PERTINENT HISTORY: Pt is a 51 y/o F presenting to ED on 6/16 with slurred speech, CT negative, MRI revealing acute small vessel infarct in L basal ganglia and adjacent white matter. PMH includes Huntington's Disease, HLD, HTN, DM, suicidal overdose, vitamin D deficiency. d/c from CIR 12/06/21  PRECAUTIONS: {Therapy precautions:24002}  WEIGHT BEARING RESTRICTIONS {Yes ***/No:24003}  PAIN:  Are you having pain? {OPRCPAIN:27236}  FALLS: Has patient fallen in last 6 months? {fallsyesno:27318}  LIVING ENVIRONMENT: Lives with: {OPRC lives with:25569::"lives with  their family"} Lives in: {Lives in:25570} Stairs: {opstairs:27293} Has following equipment at home: {Assistive devices:23999}  PLOF: {PLOF:24004}  PATIENT GOALS ***  OBJECTIVE:   HAND DOMINANCE: {MISC; OT HAND DOMINANCE:[phone]}  ADLs: Overall ADLs: *** Transfers/ambulation related to ADLs: Eating: *** Grooming: *** UB Dressing: *** LB Dressing: *** Toileting: *** Bathing: *** Tub Shower transfers: *** Equipment: {equipment:25573}   IADLs: Shopping: *** Light housekeeping: *** Meal Prep: *** Community mobility: *** Medication management: *** Financial management: *** Handwriting: {OTWRITTENEXPRESSION:25361}  MOBILITY STATUS: {OTMOBILITY:25360}  POSTURE COMMENTS:  {posture:25561} Sitting balance: {sitting balance:25483}  ACTIVITY TOLERANCE: Activity tolerance: ***  FUNCTIONAL OUTCOME MEASURES: {OTFUNCTIONALMEASURES:27238}  UPPER EXTREMITY ROM     {AROM/PROM:27142} ROM Right eval Left eval  Shoulder flexion    Shoulder abduction    Shoulder adduction    Shoulder extension    Shoulder internal rotation    Shoulder external rotation    Elbow flexion    Elbow extension    Wrist flexion    Wrist extension    Wrist ulnar deviation    Wrist radial deviation    Wrist pronation    Wrist supination    (Blank rows = not tested)   UPPER EXTREMITY MMT:     MMT Right eval Left eval  Shoulder flexion    Shoulder abduction    Shoulder adduction    Shoulder extension    Shoulder internal rotation    Shoulder external rotation    Middle trapezius    Lower trapezius    Elbow flexion    Elbow extension    Wrist flexion    Wrist extension    Wrist ulnar deviation    Wrist radial deviation  Wrist pronation    Wrist supination    (Blank rows = not tested)  HAND FUNCTION: {handfunction:27230}  COORDINATION: {otcoordination:27237}  SENSATION: {sensation:27233}  EDEMA: ***  MUSCLE TONE: {UETONE:25567}  COGNITION: Overall cognitive  status: {cognition:24006}  VISION: Subjective report: *** Baseline vision: {OTBASELINEVISION:25363} Visual history: {OTVISUALHISTORY:25364}  VISION ASSESSMENT: {visionassessment:27231}  Patient has difficulty with following activities due to following visual impairments: ***  PERCEPTION: {Perception:25564}  PRAXIS: {Praxis:25565}  OBSERVATIONS: ***   TODAY'S TREATMENT:  ***   PATIENT EDUCATION: Education details: *** Person educated: {Person educated:25204} Education method: {Education Method:25205} Education comprehension: {Education Comprehension:25206}   HOME EXERCISE PROGRAM: ***    GOALS: Goals reviewed with patient? {yes/no:20286}  SHORT TERM GOALS: Target date: ***  *** Baseline: Goal status: {GOALSTATUS:25110}  2.  *** Baseline:  Goal status: {GOALSTATUS:25110}  3.  *** Baseline:  Goal status: {GOALSTATUS:25110}  4.  *** Baseline:  Goal status: {GOALSTATUS:25110}  5.  *** Baseline:  Goal status: {GOALSTATUS:25110}  6.  *** Baseline:  Goal status: {GOALSTATUS:25110}  LONG TERM GOALS: Target date: ***  *** Baseline:  Goal status: {GOALSTATUS:25110}  2.  *** Baseline:  Goal status: {GOALSTATUS:25110}  3.  *** Baseline:  Goal status: {GOALSTATUS:25110}  4.  *** Baseline:  Goal status: {GOALSTATUS:25110}  5.  *** Baseline:  Goal status: {GOALSTATUS:25110}  6.  *** Baseline:  Goal status: {GOALSTATUS:25110}  ASSESSMENT:  CLINICAL IMPRESSION: Patient is a *** y.o. *** who was seen today for occupational therapy evaluation for ***.   PERFORMANCE DEFICITS in functional skills including {OT physical skills:25468}, cognitive skills including {OT cognitive skills:25469}, and psychosocial skills including {OT psychosocial skills:25470}.   IMPAIRMENTS are limiting patient from {OT performance deficits:25471}.   COMORBIDITIES {Comorbidities:25485} that affects occupational performance. Patient will benefit from skilled OT to  address above impairments and improve overall function.  MODIFICATION OR ASSISTANCE TO COMPLETE EVALUATION: {OT modification:25474}  OT OCCUPATIONAL PROFILE AND HISTORY: {OT PROFILE AND HISTORY:25484}  CLINICAL DECISION MAKING: {OT CDM:25475}  REHAB POTENTIAL: {rehabpotential:25112}  EVALUATION COMPLEXITY: {Evaluation complexity:25115}    PLAN: OT FREQUENCY: {rehab frequency:25116}  OT DURATION: {rehab duration:25117}  PLANNED INTERVENTIONS: {OT Interventions:25467}  RECOMMENDED OTHER SERVICES: ***  CONSULTED AND AGREED WITH PLAN OF CARE: {TLX:72620}  PLAN FOR NEXT SESSION: Theone Murdoch, OT 12/18/2021, 5:23 PM

## 2021-12-19 ENCOUNTER — Ambulatory Visit: Payer: Managed Care, Other (non HMO)

## 2021-12-19 ENCOUNTER — Ambulatory Visit: Payer: Managed Care, Other (non HMO) | Admitting: Occupational Therapy

## 2021-12-19 ENCOUNTER — Ambulatory Visit: Payer: Managed Care, Other (non HMO) | Attending: Physician Assistant | Admitting: Physical Therapy

## 2021-12-19 DIAGNOSIS — R41841 Cognitive communication deficit: Secondary | ICD-10-CM

## 2021-12-19 DIAGNOSIS — R278 Other lack of coordination: Secondary | ICD-10-CM | POA: Diagnosis present

## 2021-12-19 DIAGNOSIS — R471 Dysarthria and anarthria: Secondary | ICD-10-CM | POA: Diagnosis present

## 2021-12-19 DIAGNOSIS — M6281 Muscle weakness (generalized): Secondary | ICD-10-CM | POA: Insufficient documentation

## 2021-12-19 DIAGNOSIS — R482 Apraxia: Secondary | ICD-10-CM | POA: Diagnosis present

## 2021-12-19 DIAGNOSIS — R4701 Aphasia: Secondary | ICD-10-CM | POA: Diagnosis present

## 2021-12-19 DIAGNOSIS — R41840 Attention and concentration deficit: Secondary | ICD-10-CM | POA: Insufficient documentation

## 2021-12-19 DIAGNOSIS — R2689 Other abnormalities of gait and mobility: Secondary | ICD-10-CM | POA: Insufficient documentation

## 2021-12-19 DIAGNOSIS — I69351 Hemiplegia and hemiparesis following cerebral infarction affecting right dominant side: Secondary | ICD-10-CM | POA: Insufficient documentation

## 2021-12-19 DIAGNOSIS — R2681 Unsteadiness on feet: Secondary | ICD-10-CM | POA: Diagnosis present

## 2021-12-19 NOTE — Therapy (Signed)
OUTPATIENT SPEECH LANGUAGE PATHOLOGY EVALUATION   Patient Name: Hayley Bryant MRN: [id number] DOB:05/09/1971, 51 y.o., female Today's Date: 12/20/2021  PCP: Barrie Lyme  REFERRING PROVIDER: Cathlyn Parsons, PA-C    End of Session - 12/19/21 1017     Visit Number 1    Number of Visits 17    Date for SLP Re-Evaluation 02/14/22    Authorization Type [employer]    SLP Start Time 0930    SLP Stop Time  0623    SLP Time Calculation (min) 45 min    Activity Tolerance Patient tolerated treatment well             Past Medical History:  Diagnosis Date   Anxiety    Depression    Diabetes mellitus    Fibroids    Hypertension    Past Surgical History:  Procedure Laterality Date   CHOLECYSTECTOMY     LYMPH NODE DISSECTION     Patient Active Problem List   Diagnosis Date Noted   Acute ischemic stroke ([place]) 11/22/2021   Acute CVA (cerebrovascular accident) ([place]) 11/15/2021   Abnormal LFTs 11/15/2021   Hyperlipidemia associated with type 2 diabetes mellitus ([place]) 09/07/2017   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   Uncontrolled type 2 diabetes mellitus with hyperglycemia ([place]) 05/07/2017   Overweight (BMI 25.0-29.9) 09/20/2013   Difficulty sleeping 09/20/2013   Vitamin D deficiency 06/21/2013   Suicidal overdose ([place]) 12/06/2012   HTN (hypertension) 12/06/2012   Type 2 diabetes mellitus ([place]) 12/06/2012   Depression 07/13/2012   Anxiety 07/13/2012    ONSET DATE: 12/04/2021    REFERRING DIAG: I63.9 (ICD-10-CM) - Cerebral infarction, unspecified   THERAPY DIAG:  Aphasia  Verbal apraxia  Dysarthria and anarthria  Cognitive communication deficit  Rationale for Evaluation and Treatment Rehabilitation  SUBJECTIVE:   SUBJECTIVE STATEMENT: "I get stumbled up with words"  Pt accompanied by: family member (children)  PERTINENT HISTORY: 51 year old female with history of anxiety, depression, insomnia, suicidal overdose, Vit D deficiency, type 2 DM, HTN,  Huntington's like disease who presented on 11/15/21 with mild confusion, slurred speech and inability to communicate. MRI showed acute infarct involving left basal ganglia an adjacent matter. CTA head and neck with no significant LVO.   PAIN:  Are you having pain? No   FALLS: Has patient fallen in last 6 months?  Yes, Number of falls: 1, See PT evaluation for details  LIVING ENVIRONMENT: Lives with: lives with their family Lives in: House/apartment  PLOF:  Level of assistance: Needed assistance with ADLs, Needed assistance with IADLS Employment: Retired Astronomer - Advertising copywriter)    PATIENT GOALS "I want to learn how to get my words out. And word finding"   OBJECTIVE:   COGNITION: Overall cognitive status: History of cognitive impairments and Difficulty to assess due to: Communication impairment Areas of impairment:  Attention: Impaired: Sustained, Selective Memory: Impaired: Working, Industrial/product designer term Awareness: Impaired: Intellectual and Emergent Functional deficits: Pt and children reported memory deficits prior to stroke and that patient is able to recall better when given secondary repetition of information  AUDITORY COMPREHENSION: Overall auditory comprehension: Impaired: moderately complex YES/NO questions: Appears intact Following directions: Impaired: simple and moderately complex Conversation: Simple Interfering components: processing speed and working memory Effective technique: repetition/stressing words  READING COMPREHENSION: Not tested  EXPRESSION: verbal  VERBAL EXPRESSION: Level of generative/spontaneous verbalization: conversation Automatic speech: name: intact, social response: intact, and counting: intact  Repetition: Appears intact Naming: Confrontation: 51-75% Pragmatics: Appears intact Interfering components:  attention and speech intelligibility Effective technique: phonemic cues and written cues Non-verbal means of communication: N/A Comments: Prior  difficulty talking endorsed but pt unable to provide concrete examples. Endorsed she is now gets "stumbled up with words" and her children endorsed difficulty finding her words. Pt presented with semantic paraphasias during picture naming and benefited from phonemic and first letter cues to correct.   WRITTEN EXPRESSION: Dominant hand: right  Written expression: Impaired: word  MOTOR SPEECH: Overall motor speech: impaired Level of impairment: Phrase, Sentence, and Conversation Respiration: thoracic breathing and clavicular breathing Phonation: breathy and low vocal intensity Resonance: WFL Articulation: Impaired: phrase Intelligibility: Intelligibility reduced Motor planning: Impaired: groping for words and inconsistent Interfering components:  Huntington like disease Effective technique: slow rate, increased vocal intensity, and over articulate  ORAL MOTOR EXAMINATION Overall status: Impaired:   Lingual: Bilateral (ROM and Coordination) Comments: reduced lingual coordination during OME and AMR/SMR   STANDARDIZED ASSESSMENTS: QAB: Mild (8.0) Connected Speech=intermittent vague language (ex: "things like that") and unintelligible words; required prompting to continue speaking Picture naming (3/6)= crayon for pencil, squid for octopus, kite/kayak for hammock (benefited from phonemic and first letter cues)    PATIENT REPORTED OUTCOME MEASURES (PROM): Communication Participation Item Bank: 15 "A little" = talking with people you know, say something quickly, community in community, asking questions in conversation, communicating in small group of people "Quite a bit" = talking with people you don't know, having a long or detailed conversation, getting turn in fast moving conversation, trying to persuade someone    TODAY'S TREATMENT:  12-19-21: Educated patient and children on clinical observations and recommendations for initiate ST POC to address aphasia, apraxia, dysarthria, and  cognition. All verbalized understanding and agreement.    PATIENT EDUCATION: Education details: see above Person educated: Patient and Child(ren) Education method: Customer service manager Education comprehension: verbalized understanding, returned demonstration, and needs further education     GOALS: Goals reviewed with patient? Yes  SHORT TERM GOALS: Target date: 01/16/2022    Pt will ID and attempt to correct speech errors (apraxic errors/dysnomia) with 80% accuracy on structured tasks given occasional mod A over sessions  Baseline: Goal status: INITIAL  2.  Pt will implement dysarthria compensations to be 90+% intelligible in 5-10 minute conversations given occasional mod A over 2 sessions Baseline:  Goal status: INITIAL  3.   Pt will utilize word retrieval strategies on structured speech tasks with 80% accuracy given occasional mod A over 2 sessions  Baseline:  Goal status: INITIAL  4.  Pt will use cognitive compensations to aid recall of functional information given occasional mod A over 2 sessions  Baseline:  Goal status: INITIAL  5.  Family will provide appropriate cues to support cognitive communication skills as demonstrated in [place] sessions given occasional min A over 2 sessions Baseline:  Goal status: INITIAL    LONG TERM GOALS: Target date: 02/14/2022   Pt will ID and attempt to correct speech errors (apraxic errors/dysnomia) with 90% accuracy in conversation given occasional min A over 2 sessions  Baseline:  Goal status: INITIAL  2.  Pt will use trained dysarthria compensations to be 95+% intelligible in 15+ minute conversations given occasional min A over 2 sessions Baseline:  Goal status: INITIAL  3.  Pt will utilize word retrieval strategies to optimize ability to functionally communicate in personally relevant conversations given occasional min A over 2 sessions Baseline:  Goal status: INITIAL  4.  Pt will use cognitive compensations to aid  recall of functional  information given occasional min A over 2 sessions Baseline:  Goal status: INITIAL  5.  Pt will report improved communication effectiveness via PROM by 2 points at last ST session  Baseline: CPIB=15 Goal status: INITIAL   ASSESSMENT:  CLINICAL IMPRESSION: Patient is a 51 y.o. female who was seen today for CVA in June 2023. PMHX significant for anxiety, depression, and Huntington like disease. Prior difficulty with talking, memory, and walking reported. Endorsed recent communication changes including "stumbling" with words and difficulty with word retrieval. Pt presents with intermittent unintelligible responses related to apraxia and dysarthria, semantic paraphasias, use of vague language, and reduced conversational volume. Pt now requires increased repetition to aid recall of novel information. Pt would benefit from skilled ST intervention to address speech, language, and cognitive changes to optimize communication effectiveness and functional independence.   OBJECTIVE IMPAIRMENTS include memory, aphasia, apraxia, and dysarthria. These impairments are limiting patient from household responsibilities and effectively communicating at home and in community. Factors affecting potential to achieve goals and functional outcome are medical prognosis and previous level of function. Patient will benefit from skilled SLP services to address above impairments and improve overall function.  REHAB POTENTIAL: Good  PLAN: SLP FREQUENCY: 2x/week  SLP DURATION: 8 weeks  PLANNED INTERVENTIONS: Language facilitation, Environmental controls, Cueing hierachy, Cognitive reorganization, Internal/external aids, Functional tasks, Multimodal communication approach, SLP instruction and feedback, Compensatory strategies, and Patient/family education    Marzetta Board, CCC-SLP 12/20/2021, 9:38 AM

## 2021-12-26 ENCOUNTER — Encounter: Payer: Self-pay | Admitting: Physical Therapy

## 2021-12-26 ENCOUNTER — Encounter: Payer: Self-pay | Admitting: Occupational Therapy

## 2021-12-26 ENCOUNTER — Ambulatory Visit: Payer: Managed Care, Other (non HMO) | Admitting: Occupational Therapy

## 2021-12-26 ENCOUNTER — Ambulatory Visit: Payer: Managed Care, Other (non HMO) | Admitting: Physical Therapy

## 2021-12-26 VITALS — BP 116/79 | HR 83

## 2021-12-26 DIAGNOSIS — M6281 Muscle weakness (generalized): Secondary | ICD-10-CM

## 2021-12-26 DIAGNOSIS — R2681 Unsteadiness on feet: Secondary | ICD-10-CM

## 2021-12-26 DIAGNOSIS — I69351 Hemiplegia and hemiparesis following cerebral infarction affecting right dominant side: Secondary | ICD-10-CM | POA: Diagnosis not present

## 2021-12-26 DIAGNOSIS — R41840 Attention and concentration deficit: Secondary | ICD-10-CM

## 2021-12-26 DIAGNOSIS — R2689 Other abnormalities of gait and mobility: Secondary | ICD-10-CM

## 2021-12-26 DIAGNOSIS — R278 Other lack of coordination: Secondary | ICD-10-CM

## 2021-12-26 NOTE — Therapy (Signed)
OUTPATIENT PHYSICAL THERAPY NEURO EVALUATION   Patient Name: Hayley Bryant MRN: [id number] DOB:12/03/1970, 51 y.o., female Today's Date: 12/26/2021   PCP: Barrie Lyme, FNP REFERRING PROVIDER: Cathlyn Parsons, PA-C    PT End of Session - 12/26/21 1627     Visit Number 1    Number of Visits 17   16+eval   Date for PT Re-Evaluation 03/07/22   pushed out due to patient preference for same clinician and delay with scheduling.   Authorization Type [employer]    PT Start Time 1620   received from OT   PT Stop Time 1657    PT Time Calculation (min) 37 min    Equipment Utilized During Treatment Gait belt    Activity Tolerance Patient tolerated treatment well    Behavior During Therapy Flat affect;Impulsive             Past Medical History:  Diagnosis Date   Anxiety    Depression    Diabetes mellitus    Fibroids    Hypertension    Past Surgical History:  Procedure Laterality Date   CHOLECYSTECTOMY     LYMPH NODE DISSECTION     Patient Active Problem List   Diagnosis Date Noted   Acute ischemic stroke ([place]) 11/22/2021   Acute CVA (cerebrovascular accident) ([place]) 11/15/2021   Abnormal LFTs 11/15/2021   Hyperlipidemia associated with type 2 diabetes mellitus ([place]) 09/07/2017   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   Uncontrolled type 2 diabetes mellitus with hyperglycemia ([place]) 05/07/2017   Overweight (BMI 25.0-29.9) 09/20/2013   Difficulty sleeping 09/20/2013   Vitamin D deficiency 06/21/2013   Suicidal overdose ([place]) 12/06/2012   HTN (hypertension) 12/06/2012   Type 2 diabetes mellitus ([place]) 12/06/2012   Depression 07/13/2012   Anxiety 07/13/2012    ONSET DATE: 11/28/2021 (referral)  REFERRING DIAG: I63.9 (ICD-10-CM) - Cerebral infarction, unspecified   THERAPY DIAG:  Hemiplegia and hemiparesis following cerebral infarction affecting right dominant side (HCC)  Other abnormalities of gait and mobility  Muscle weakness (generalized)  Unsteadiness on  feet  Rationale for Evaluation and Treatment Rehabilitation  SUBJECTIVE:                                                                                                                                                                                              SUBJECTIVE STATEMENT: "My left arm has been hurting and bothering me.  That right arm is hurting the same way.  I can't lay on my side.  I have to lay on my back because of my arms." Pt accompanied by: family [place]: Presented  to [facility] emergency department on 11/15/2021 with slurred speech and confusion.  CT head and CTA of the head performed without acute findings or LVO. MRI found left basal ganglia infarct.  Admitted to rehab 11/22/2021 until 12/06/2021.  PMH:  Suicidal overdose, HTN, DM2, Acute CVA, depression, Huntington's like disease, hyperlipidemia, anxiety  PAIN:  Are you having pain? No  PRECAUTIONS: Fall  WEIGHT BEARING RESTRICTIONS No  FALLS: Has patient fallen in last 6 months? Yes. Number of falls 2- one happened yesterday when she was downstairs and tried to get up from pull out bed to plug in phone, the first fall she does not recall  LIVING ENVIRONMENT: Lives with: lives with their spouse and lives with their son-2 sons Lives in: House/apartment Stairs: Yes: Internal: 13 steps; on right going up Has following equipment at home: Environmental consultant - 2 wheeled, Environmental consultant - 4 wheeled, and shower chair  PLOF: Needs assistance with ADLs, Needs assistance with homemaking, Needs assistance with gait, and Needs assistance with transfers  PATIENT GOALS "Getting strong, move my arms and my legs."  OBJECTIVE:   DIAGNOSTIC FINDINGS: MRI of brain:  Acute small vessel infarct involving the left basal ganglia and adjacent white matter.   COGNITION: Overall cognitive status: Impaired and husband states pt repeats herself a lot and decline in  memory.   SENSATION: WFL  COORDINATION: Heel-to-shin:  WFL bilaterally LE RAMPS:  slow and deliberate  EDEMA:  None noted bilaterally  MUSCLE TONE: none noted bilaterally  POSTURE: forward head  LOWER EXTREMITY ROM:     Active  Right Eval Left Eval  Hip flexion [facility]  Hip extension    Hip abduction " "  Hip adduction " "  Hip internal rotation    Hip external rotation    Knee flexion " "  Knee extension " "  Ankle dorsiflexion " "  Ankle plantarflexion    Ankle inversion    Ankle eversion     (Blank rows = not tested)  LOWER EXTREMITY MMT:    MMT Right Eval Left Eval  Hip flexion 3+/5 4+/5  Hip extension    Hip abduction 3/5 3+/5  Hip adduction 3/5 3/5  Hip internal rotation    Hip external rotation    Knee flexion 4/5 4/5  Knee extension 4/5 4-/5  Ankle dorsiflexion 4-/5 4/5  Ankle plantarflexion    Ankle inversion    Ankle eversion    (Blank rows = not tested)  BED MOBILITY:  Sit to supine Modified independence and Min A Supine to sit Modified independence and Min A Per husband's report sometimes pt does this on her own and sometimes he gives a little help.  TRANSFERS: Assistive device utilized: Environmental consultant - 2 wheeled  Sit to stand: SBA Stand to sit: SBA Chair to chair: SBA Floor: Total A-pt reports son and husband picked her up off floor for both falls  STAIRS:  Level of Assistance: SBA  Stair Negotiation Technique: Alternating Pattern  Forwards with Single Rail on Right  Number of Stairs: 4   Height of Stairs: 6"  Comments: Pt begins turning prior to having both feet on top platform of stairs. She ascends with crouched posture.  GAIT: Gait pattern: step through pattern, decreased step length- Right, decreased step length- Left, decreased stride length, knee flexed in stance- Right, knee flexed in stance- Left, shuffling, trunk flexed, narrow BOS, poor foot clearance- Right, and poor foot clearance- Left Distance walked: 115' Assistive  device utilized: Environmental consultant - 2 wheeled  Level of assistance: SBA and CGA Comments: Pt drifts to right bumping into 2 obstacles during ambulation down straight path.    FUNCTIONAL TESTs:  5 times sit to stand: 24.00 sec w/ BUE support Timed up and go (TUG): 15.60 sec w/ RW, mildly impulsive on takeoff 10 meter walk test: 19.12 sec w/ RW = 0.52 m/sec OR 1.73 ft/sec Berg Balance Scale: To be finished next session.  [facility] PT Assessment - 12/26/21 1655       Balance   Balance Assessed Yes      Standardized Balance Assessment   Standardized Balance Assessment Berg Balance Test      Berg Balance Test   Sit to Stand Able to stand  independently using hands    Standing Unsupported Able to stand 2 minutes with supervision    Sitting with Back Unsupported but Feet Supported on Floor or Stool Able to sit safely and securely 2 minutes    Stand to Sit Uses backs of legs against chair to control descent    Transfers Able to transfer with verbal cueing and /or supervision             PATIENT SURVEYS:  FOTO To be assessed next visit.  TODAY'S TREATMENT:  N/A   PATIENT EDUCATION: Education details: PT POC, assessments used, goals set. Person educated: Patient and Spouse Education method: Explanation Education comprehension: verbalized understanding and needs further education   HOME EXERCISE PROGRAM: N/A    GOALS: Goals reviewed with patient? Yes  SHORT TERM GOALS: Target date: 01/24/2022  Pt will perform initial strength and balance HEP with supervision from family. Baseline:   To be established. Goal status: INITIAL  2.  Pt will decrease 5xSTS to </= 20 seconds w/ UE support in order to demonstrate decreased risk for falls and improved functional bilateral LE strength and power. Baseline: 24.00 sec w/ BUE Goal status: INITIAL  3.  Pt will demonstrate TUG of </=13 seconds safely in order to decrease risk of falls and improve functional mobility using LRAD. Baseline: 15.60 sec  w/ RW, impulsive Goal status: INITIAL  4.  Pt will demonstrate a gait speed of >/=2.0 feet/sec w/ LRAD in order to decrease risk for falls. Baseline: 1.73 ft/sec w/ RW Goal status: INITIAL  5.  BERG to be assessed and goal updated. Baseline: TBD Goal status: INITIAL  LONG TERM GOALS: Target date: 02/21/2022  Pt will be compliant to structured walking program at least 3 days per week with supervision from sons or husband to promote maintained aerobic gains. Baseline: To be established. Goal status: INITIAL  2.  Pt will decrease 5xSTS to </=16 seconds w/ or w/o UE support in order to demonstrate decreased risk for falls and improved functional bilateral LE strength and power. Baseline: 24.00 sec w/ BUE support Goal status: INITIAL  3.  Pt will demonstrate a gait speed of >/=2.3 feet/sec w/ LRAD in order to decrease risk for falls. Baseline: 1.73 ft/sec w/ RW Goal status: INITIAL  4.  Pt will ambulate >/= 500 feet on level surfaces with LRAD and supervision level of assist to promote household and community access. Baseline: 115' SBA/CGA w/ RW Goal status: INITIAL  5.  FOTO to be assessed and goal updated. Baseline: TBD Goal status: INITIAL  6.  BERG to be assessed and goal updated. Baseline: TBD Goal status: INITIAL  ASSESSMENT:  CLINICAL IMPRESSION: Patient is a 51 y.o. female who was seen today for physical therapy evaluation and treatment for left basal ganglia infarct.  Pt has a significant PMH of suicidal overdose, HTN, DM2, Acute CVA, depression, Huntington's like disease, hyperlipidemia, and anxiety.  Identified impairments include decreased activity tolerance, severe gait deviations, LE weakness, impaired cognition from baseline worsened by CVA, and impaired coordination.  Evaluation via the following assessment tools: 5xSTS, 10MWT, and TUG indicate fall risk.  BERG assessment to be finished next session, initiated today with pt low scoring in less demanding categories.   She would benefit from skilled PT to address impairments as noted and progress towards long term goals.  OBJECTIVE IMPAIRMENTS Abnormal gait, decreased activity tolerance, decreased balance, decreased cognition, decreased coordination, decreased endurance, decreased knowledge of condition, decreased knowledge of use of DME, decreased mobility, difficulty walking, decreased strength, decreased safety awareness, impaired UE functional use, improper body mechanics, and postural dysfunction.   ACTIVITY LIMITATIONS carrying, lifting, bending, sitting, standing, squatting, stairs, transfers, bed mobility, bathing, dressing, self feeding, hygiene/grooming, and locomotion level  PARTICIPATION LIMITATIONS: meal prep, cleaning, laundry, medication management, personal finances, interpersonal relationship, driving, shopping, community activity, and occupation  PERSONAL FACTORS Age, Behavior pattern, Fitness, Past/current experiences, Time since onset of injury/illness/exacerbation, Transportation, and 3+ comorbidities: HTN, DM2, acute CVA, depression, Huntington's like disease for ~7 years  are also affecting patient's functional outcome.   REHAB POTENTIAL: [place] personal factors and PMH  CLINICAL DECISION MAKING: Evolving/moderate complexity  EVALUATION COMPLEXITY: Moderate  PLAN: PT FREQUENCY: 2x/week  PT DURATION: 8 weeks  PLANNED INTERVENTIONS: Therapeutic exercises, Therapeutic activity, Neuromuscular re-education, Balance training, Gait training, Patient/Family education, Self Care, Stair training, Vestibular training, DME instructions, Manual therapy, and Re-evaluation  PLAN FOR NEXT SESSION: Finish BERG assessment and have pt complete FOTO-STG/LTG, initiate HEP for posture in standing/LE strength/dynamic balance, gait training w/ RW   Bary Richard, PT, DPT 12/26/2021, 5:32 PM

## 2021-12-26 NOTE — Addendum Note (Signed)
Addended by: Mariah Milling on: 12/26/2021 04:55 PM   Modules accepted: Orders

## 2021-12-26 NOTE — Therapy (Signed)
OUTPATIENT OCCUPATIONAL THERAPY NEURO EVALUATION  Patient Name: Hayley Bryant MRN: [id number] DOB:[birth year], 51 y.o., female Today's Date: 12/26/2021  PCP: Gwenlyn Perking REFERRING PROVIDER: Silvestre Mesi   OT End of Session - 12/26/21 1644     Visit Number 1    Number of Visits 17    Date for OT Re-Evaluation 02/26/22    Authorization Type [employer] [id number]    OT Start Time 1535    OT Stop Time 0867    OT Time Calculation (min) 40 min    Behavior During Therapy Flat affect             Past Medical History:  Diagnosis Date   Anxiety    Depression    Diabetes mellitus    Fibroids    Hypertension    Past Surgical History:  Procedure Laterality Date   CHOLECYSTECTOMY     LYMPH NODE DISSECTION     Patient Active Problem List   Diagnosis Date Noted   Acute ischemic stroke ([place]) 11/22/2021   Acute CVA (cerebrovascular accident) ([place]) 11/15/2021   Abnormal LFTs 11/15/2021   Hyperlipidemia associated with type 2 diabetes mellitus ([place]) 09/07/2017   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   Uncontrolled type 2 diabetes mellitus with hyperglycemia ([place]) 05/07/2017   Overweight (BMI 25.0-29.9) 09/20/2013   Difficulty sleeping 09/20/2013   Vitamin D deficiency 06/21/2013   Suicidal overdose ([place]) 12/06/2012   HTN (hypertension) 12/06/2012   Type 2 diabetes mellitus ([place]) 12/06/2012   Depression 07/13/2012   Anxiety 07/13/2012    ONSET DATE: DOA: 11/15/2021   REFERRING DIAG: Cerebral infarction  THERAPY DIAG:  Hemiplegia and hemiparesis following cerebral infarction affecting right dominant side (HCC)  Other lack of coordination  Muscle weakness (generalized)  Unsteadiness on feet  Attention and concentration deficit  Rationale for Evaluation and Treatment Rehabilitation  SUBJECTIVE:   SUBJECTIVE STATEMENT: I want to write my name, and be secure in walking, "showering" Pt accompanied by: significant other, husband [name]  PERTINENT HISTORY: Huntington's  type II, Diabetes  PRECAUTIONS: Fall  WEIGHT BEARING RESTRICTIONS No  PAIN:  Are you having pain? No  FALLS: Has patient fallen in last 6 months? Yes. Number of falls 1 - 12/25/21  LIVING ENVIRONMENT: Lives with: lives with their family and lives with their son Lives in: House/apartment Stairs: Yes: Internal: 13 steps; on right going up and External: 0 steps; none Has following equipment at home: Walker - 2 wheeled, Environmental consultant - 4 wheeled, Manufacturing engineer  PLOF: Independent with basic ADLs  PATIENT GOALS  Write her name  OBJECTIVE:   HAND DOMINANCE: Right  ADLs: Overall ADLs: mod assist Transfers/ambulation related to ADLs: Eating: min assist - difficulty with utensils - eating finger foods Grooming: mod assist UB Dressing: mod assist LB Dressing: Min assist Toileting: min assist Bathing: mod assist Tub Shower transfers: tub transfer bench Equipment: Transfer tub bench   IADLs: Shopping: To be determined Light housekeeping: To be determined Meal Prep: To be determined Community mobility: walks with walker and min assist/close supervision Medication management: Unable Financial management: Unable Handwriting: 25% legible    POSTURE COMMENTS:  Constant movement trunk / limbs Sitting balance: Sits without support for 30 sec  ACTIVITY TOLERANCE: Activity tolerance: Impaired    UPPER EXTREMITY ROM     Active ROM Right eval Left eval  Shoulder flexion [facility]  Shoulder abduction    Shoulder adduction    Shoulder extension    Shoulder internal rotation    Shoulder external rotation  Elbow flexion    Elbow extension    Wrist flexion    Wrist extension    Wrist ulnar deviation    Wrist radial deviation    Wrist pronation    Wrist supination    (Blank rows = not tested)   UPPER EXTREMITY MMT:     MMT Right eval Left eval  Shoulder flexion 4/5 4/5  Shoulder abduction    Shoulder adduction    Shoulder extension    Shoulder internal rotation     Shoulder external rotation    Middle trapezius    Lower trapezius    Elbow flexion    Elbow extension    Wrist flexion    Wrist extension    Wrist ulnar deviation    Wrist radial deviation    Wrist pronation    Wrist supination    (Blank rows = not tested)  HAND FUNCTION: Grip strength: Right: 39.6 lbs; Left: 28.6 lbs and Lateral pinch: Right: 8 lbs, Left: 10 lbs  COORDINATION: Box and Blocks:  Right 13blocks, Left NTblocks  SENSATION: Light touch: WFL Proprioception: Impaired     MUSCLE TONE: RUE: Mild and Hypertonic and LUE: Mild and Hypertonic  COGNITION: Overall cognitive status: Impaired  VISION: Subjective report: Reports no change since stroke Baseline vision: Bifocals Visual history: declining vision - needs eye doctor appt this year  VISION ASSESSMENT: To be further assessed in functional context   PERCEPTION: Impaired: Inattention/neglect: does not attend to right side of body  PRAXIS: Impaired: Initiation and Motor planning   TODAY'S TREATMENT:  Reviewed potential plan of care and goals.    PATIENT EDUCATION: Education details: evaluation results and potential goals Person educated: Patient and Spouse Education method: Explanation Education comprehension: needs further education   HOME EXERCISE PROGRAM: TBD    GOALS: Goals reviewed with patient? Yes  SHORT TERM GOALS: Target date: 01/26/22  Patient will complete HEP designed to improve coordination in RUE Baseline: Goal status: INITIAL  2.  Patient will don bra and pull over shirt with min assist Baseline:  Goal status: INITIAL  3.  Patient will use utensils in RUE to feed herself 30% meal Baseline:  Goal status: INITIAL  4.  Patient will dress lower body with min assist Baseline:  Goal status: INITIAL    LONG TERM GOALS: Target date: 02/26/22  Patient will complete updated home activity program designed to improve functional use of RUE Baseline:  Goal status:  INITIAL  2.  Patient will dress herself with supervision and verbal cueing Baseline:  Goal status: INITIAL  3.  Patient will shower with distant supervision Baseline:  Goal status: INITIAL  4.  Patient will complete at least 20 block in one minute (box and blocks) Baseline:  Goal status: INITIAL  5.  Patient will complete toileting with modified independence Baseline:  Goal status: INITIAL    ASSESSMENT:  CLINICAL IMPRESSION: Patient is a 51 y.o. female with medical history significant of anxiety, depression, insomnia, suicidal overdose, vitamin D deficiency, overweight with a BMI of 25.06 kg/m, uncontrolled type II DM, hypertension, Huntington's-type II disease, now with cerebral infarction.    PERFORMANCE DEFICITS in functional skills including coordination, dexterity, proprioception, sensation, tone, ROM, strength, FMC, GMC, mobility, balance, endurance, and UE functional use, cognitive skills including attention, memory, problem solving, safety awareness, and sequencing, and psychosocial skills including coping strategies and interpersonal interactions.   IMPAIRMENTS are limiting patient from ADLs and IADLs.   COMORBIDITIES has co-morbidities such as DM and Huntington's  that affects occupational performance. Patient will benefit from skilled OT to address above impairments and improve overall function.  MODIFICATION OR ASSISTANCE TO COMPLETE EVALUATION: Min-Moderate modification of tasks or assist with assess necessary to complete an evaluation.  OT OCCUPATIONAL PROFILE AND HISTORY: Detailed assessment: Review of records and additional review of physical, cognitive, psychosocial history related to current functional performance.  CLINICAL DECISION MAKING: Moderate - several treatment options, min-mod task modification necessary  REHAB POTENTIAL: Fair H/o Huntington's Type II  EVALUATION COMPLEXITY: Moderate    PLAN: OT FREQUENCY: 2x/week  OT DURATION: 8  weeks  PLANNED INTERVENTIONS: self care/ADL training, therapeutic exercise, therapeutic activity, neuromuscular re-education, balance training, functional mobility training, aquatic therapy, patient/family education, cognitive remediation/compensation, visual/perceptual remediation/compensation, and DME and/or AE instructions  RECOMMENDED OTHER SERVICES: Neuropsych  CONSULTED AND AGREED WITH PLAN OF CARE: Patient and family Furniture conservator/restorer FOR NEXT SESSION: Forced use RUE, Coordination, ADL's   Mariah Milling, OT 12/26/2021, 4:51 PM

## 2021-12-30 ENCOUNTER — Other Ambulatory Visit: Payer: Self-pay

## 2021-12-30 ENCOUNTER — Emergency Department (HOSPITAL_COMMUNITY): Payer: Managed Care, Other (non HMO)

## 2021-12-30 ENCOUNTER — Emergency Department (HOSPITAL_COMMUNITY)
Admission: EM | Admit: 2021-12-30 | Discharge: 2021-12-30 | Disposition: A | Discharge status: Home / Self Care | Payer: Managed Care, Other (non HMO) | Attending: Emergency Medicine | Admitting: Emergency Medicine

## 2021-12-30 ENCOUNTER — Encounter (HOSPITAL_COMMUNITY): Payer: Self-pay

## 2021-12-30 DIAGNOSIS — Z7984 Long term (current) use of oral hypoglycemic drugs: Secondary | ICD-10-CM | POA: Diagnosis not present

## 2021-12-30 DIAGNOSIS — R1011 Right upper quadrant pain: Secondary | ICD-10-CM | POA: Diagnosis present

## 2021-12-30 DIAGNOSIS — Z79899 Other long term (current) drug therapy: Secondary | ICD-10-CM | POA: Diagnosis not present

## 2021-12-30 DIAGNOSIS — Z7982 Long term (current) use of aspirin: Secondary | ICD-10-CM | POA: Insufficient documentation

## 2021-12-30 DIAGNOSIS — E119 Type 2 diabetes mellitus without complications: Secondary | ICD-10-CM | POA: Insufficient documentation

## 2021-12-30 DIAGNOSIS — I10 Essential (primary) hypertension: Secondary | ICD-10-CM | POA: Diagnosis not present

## 2021-12-30 DIAGNOSIS — K2980 Duodenitis without bleeding: Secondary | ICD-10-CM | POA: Diagnosis not present

## 2021-12-30 DIAGNOSIS — Z794 Long term (current) use of insulin: Secondary | ICD-10-CM | POA: Diagnosis not present

## 2021-12-30 DIAGNOSIS — E876 Hypokalemia: Secondary | ICD-10-CM | POA: Diagnosis not present

## 2021-12-30 DIAGNOSIS — Z20822 Contact with and (suspected) exposure to covid-19: Secondary | ICD-10-CM | POA: Insufficient documentation

## 2021-12-30 HISTORY — DX: Cerebral infarction, unspecified: I63.9

## 2021-12-30 HISTORY — DX: Huntington's disease: G10

## 2021-12-30 LAB — CBC WITH DIFFERENTIAL/PLATELET
Abs Immature Granulocytes: 0.02 10*3/uL (ref 0.00–0.07)
Basophils Absolute: 0 10*3/uL (ref 0.0–0.1)
Basophils Relative: 0 %
Eosinophils Absolute: 0 10*3/uL (ref 0.0–0.5)
Eosinophils Relative: 0 %
HCT: 33.5 % — ABNORMAL LOW (ref 36.0–46.0)
Hemoglobin: 10.8 g/dL — ABNORMAL LOW (ref 12.0–15.0)
Immature Granulocytes: 0 %
Lymphocytes Relative: 17 %
Lymphs Abs: 1.3 10*3/uL (ref 0.7–4.0)
MCH: 28.7 pg (ref 26.0–34.0)
MCHC: 32.2 g/dL (ref 30.0–36.0)
MCV: 89.1 fL (ref 80.0–100.0)
Monocytes Absolute: 0.5 10*3/uL (ref 0.1–1.0)
Monocytes Relative: 6 %
Neutro Abs: 6.1 10*3/uL (ref 1.7–7.7)
Neutrophils Relative %: 77 %
Platelets: 314 10*3/uL (ref 150–400)
RBC: 3.76 MIL/uL — ABNORMAL LOW (ref 3.87–5.11)
RDW: 17.5 % — ABNORMAL HIGH (ref 11.5–15.5)
WBC: 7.9 10*3/uL (ref 4.0–10.5)
nRBC: 0 % (ref 0.0–0.2)

## 2021-12-30 LAB — HEPATIC FUNCTION PANEL
ALT: 17 U/L (ref 0–44)
AST: 15 U/L (ref 15–41)
Albumin: 3.8 g/dL (ref 3.5–5.0)
Alkaline Phosphatase: 45 U/L (ref 38–126)
Bilirubin, Direct: <0.1 mg/dL (ref 0.0–0.2)
Indirect Bilirubin: 0.3 mg/dL (ref 0.3–0.9)
Total Bilirubin: 0.3 mg/dL (ref 0.3–1.2)
Total Protein: 7.3 g/dL (ref 6.5–8.1)

## 2021-12-30 LAB — RESP PANEL BY RT-PCR (FLU A&B, COVID) ARPGX2
Influenza A by PCR: NEGATIVE
Influenza B by PCR: NEGATIVE
SARS Coronavirus 2 by RT PCR: NEGATIVE

## 2021-12-30 LAB — BASIC METABOLIC PANEL
Anion gap: 10 (ref 5–15)
BUN: 12 mg/dL (ref 6–20)
CO2: 27 mmol/L (ref 22–32)
Calcium: 9.4 mg/dL (ref 8.9–10.3)
Chloride: 103 mmol/L (ref 98–111)
Creatinine, Ser: 0.68 mg/dL (ref 0.44–1.00)
GFR, Estimated: >60 mL/min (ref >60)
Glucose, Bld: 139 mg/dL — ABNORMAL HIGH (ref 70–99)
Potassium: 3 mmol/L — ABNORMAL LOW (ref 3.5–5.1)
Sodium: 140 mmol/L (ref 135–145)

## 2021-12-30 LAB — LIPASE, BLOOD: Lipase: 41 U/L (ref 11–51)

## 2021-12-30 LAB — TROPONIN I (HIGH SENSITIVITY)
Troponin I (High Sensitivity): 2 ng/L (ref <18)
Troponin I (High Sensitivity): 3 ng/L (ref <18)

## 2021-12-30 MED ORDER — PANTOPRAZOLE SODIUM 40 MG PO TBEC: 40.0000 mg | DELAYED_RELEASE_TABLET | Freq: Every day | ORAL | 0 refills | Status: AC

## 2021-12-30 MED ORDER — IOHEXOL 300 MG/ML  SOLN
100.0000 mL | Freq: Once | INTRAMUSCULAR | Status: AC | PRN
  Administered 2021-12-30: 100 mL via INTRAVENOUS

## 2021-12-30 MED ORDER — POTASSIUM CHLORIDE CRYS ER 20 MEQ PO TBCR: 20.0000 meq | EXTENDED_RELEASE_TABLET | Freq: Two times a day (BID) | ORAL | 0 refills | Status: AC

## 2021-12-30 MED ORDER — LIDOCAINE VISCOUS HCL 2 % MT SOLN
15.0000 mL | Freq: Once | OROMUCOSAL | Status: AC
  Administered 2021-12-30: 15 mL via ORAL
  Filled 2021-12-30: qty 15

## 2021-12-30 MED ORDER — SUCRALFATE 1 G PO TABS: 1.0000 g | ORAL_TABLET | Freq: Three times a day (TID) | ORAL | 0 refills | Status: AC

## 2021-12-30 MED ORDER — FAMOTIDINE IN NACL 20-0.9 MG/50ML-% IV SOLN
20.0000 mg | Freq: Once | INTRAVENOUS | Status: AC
  Administered 2021-12-30: 20 mg via INTRAVENOUS
  Filled 2021-12-30: qty 50

## 2021-12-30 MED ORDER — POTASSIUM CHLORIDE CRYS ER 20 MEQ PO TBCR
40.0000 meq | EXTENDED_RELEASE_TABLET | Freq: Once | ORAL | Status: AC
  Administered 2021-12-30: 40 meq via ORAL
  Filled 2021-12-30: qty 2

## 2021-12-30 MED ORDER — ALUM & MAG HYDROXIDE-SIMETH 200-200-20 MG/5ML PO SUSP
30.0000 mL | Freq: Once | ORAL | Status: AC
  Administered 2021-12-30: 30 mL via ORAL
  Filled 2021-12-30: qty 30

## 2021-12-30 NOTE — ED Provider Triage Note (Signed)
Emergency Medicine Provider Triage Evaluation Note  Hayley Bryant , a 51 y.o. female  was evaluated in triage.  Pt complains of chest pain and shortness of breath.  Patient states that she has had intermittent stabbing, right-sided, nonradiating chest pain over the past 3 days.  She states that she has had associated shortness of breath when she is ambulating and intermittently gets lightheaded.  She also endorses a nonproductive cough without fever.. Recent stroke with right-sided deficits.  She denies any difficulty swallowing due to the stroke. Review of Systems  Positive:  Negative:   Physical Exam  BP 117/85 (BP Location: Left Arm)   Pulse 88   Temp 98.6 F (37 C) (Oral)   Resp 18   SpO2 99%  Gen:   Awake, no distress   Resp:  Normal effort  MSK:   Moves extremities without difficulty  Other:  S1/S2 without murmur  Medical Decision Making  Medically screening exam initiated at 1:32 PM.  Appropriate orders placed.  Hayley Bryant was informed that the remainder of the evaluation will be completed by another provider, this initial triage assessment does not replace that evaluation, and the importance of remaining in the ED until their evaluation is complete.     Mickie Hillier, PA-C 12/30/21 1333

## 2021-12-30 NOTE — ED Triage Notes (Signed)
Patient c/o intermittent chest pain with SOB and a non productive cough x 3 days. Patient  states chest pain is worse with a deep breath.  Patient has a history of Huntington's type 2 and a recent stroke with right sided weakness.

## 2021-12-30 NOTE — Discharge Instructions (Addendum)
It was a pleasure caring for you today in the emergency department.  Please return to the emergency department for any worsening or worrisome symptoms.  Please follow up with gastroenterology in 2 weeks, please call tomorrow to set up appointment.

## 2021-12-30 NOTE — ED Provider Notes (Signed)
[facility] DEPT Provider Note   CSN: [id number] Arrival date & time: 12/30/21  1228     History {Add pertinent medical, surgical, social history, OB history to HPI:1} Chief Complaint  Patient presents with   Chest Pain    Hayley Bryant is a 51 y.o. female.  Patient as above with significant medical history as below, including DM, huntington disease, HTN, CVA w/ r sided residual deficits who presents to the ED with complaint of ***     Past Medical History:  Diagnosis Date   Anxiety    Depression    Diabetes mellitus    Fibroids    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[place])    Hypertension    Stroke [facility])     Past Surgical History:  Procedure Laterality Date   CHOLECYSTECTOMY     LYMPH NODE DISSECTION       The history is provided by the patient. No language interpreter was used.  Chest Pain      Home Medications Prior to Admission medications   Medication Sig Start Date End Date Taking? Authorizing Provider  acetaminophen (TYLENOL) 325 MG tablet Take 1-2 tablets (325-650 mg total) by mouth every 4 (four) hours as needed for mild pain. 12/06/21   Setzer, Edman Circle, PA-C  aspirin EC 81 MG tablet Take 1 tablet (81 mg total) by mouth daily. Swallow whole. 12/07/21   Setzer, Edman Circle, PA-C  atorvastatin (LIPITOR) 40 MG tablet Take 1 tablet (40 mg total) by mouth daily. 12/07/21   Setzer, Edman Circle, PA-C  blood glucose meter kit and supplies KIT Dispense based on patient and insurance preference. Use up to four times daily as directed. 09/16/21   Prosperi, Christian H, PA-C  diclofenac Sodium (VOLTAREN) 1 % GEL Apply 2 g topically 4 (four) times daily. 12/06/21   Setzer, Edman Circle, PA-C  Dulaglutide (TRULICITY) 3 GY/1.7CB SOPN Inject 3 mg as directed once a week. Patient not taking: Reported on 11/15/2021 09/16/21   Prosperi, Christian H, PA-C  empagliflozin (JARDIANCE) 10 MG TABS tablet Take 1 tablet (10 mg total) by mouth daily. Patient not taking: Reported on  11/15/2021 09/16/21   Prosperi, Christian H, PA-C  FLUoxetine (PROZAC) 20 MG capsule Take 3 capsules (60 mg total) by mouth daily. 12/06/21   Setzer, Edman Circle, PA-C  Insulin Glargine (BASAGLAR KWIKPEN) 100 UNIT/ML Inject 15 Units into the skin daily. 12/06/21   Setzer, Edman Circle, PA-C  lisinopril-hydrochlorothiazide (ZESTORETIC) 20-25 MG tablet Take 1 tablet by mouth daily. 12/06/21   Setzer, Edman Circle, PA-C  traZODone (DESYREL) 50 MG tablet Take 0.5-1 tablets (25-50 mg total) by mouth at bedtime as needed for sleep. 12/06/21   Setzer, Edman Circle, PA-C      Allergies    Metformin and related    Review of Systems   Review of Systems  Cardiovascular:  Positive for chest pain.    Physical Exam Updated Vital Signs BP 130/83   Pulse 65   Temp 98.3 F (36.8 C) (Oral)   Resp 18   Ht 5' 7"  (1.702 m)   Wt 72.6 kg   LMP 12/23/2021 (Approximate)   SpO2 100%   BMI 25.06 kg/m  Physical Exam  ED Results / Procedures / Treatments   Labs (all labs ordered are listed, but only abnormal results are displayed) Labs Reviewed  BASIC METABOLIC PANEL - Abnormal; Notable for the following components:      Result Value   Potassium 3.0 (*)    Glucose, Bld 139 (*)  All other components within normal limits  CBC WITH DIFFERENTIAL/PLATELET - Abnormal; Notable for the following components:   RBC 3.76 (*)    Hemoglobin 10.8 (*)    HCT 33.5 (*)    RDW 17.5 (*)    All other components within normal limits  RESP PANEL BY RT-PCR (FLU A&B, COVID) ARPGX2  LIPASE, BLOOD  HEPATIC FUNCTION PANEL  TROPONIN I (HIGH SENSITIVITY)  TROPONIN I (HIGH SENSITIVITY)    EKG None  Radiology DG Chest 2 View  Result Date: 12/30/2021 CLINICAL DATA:  Shortness of breath, intermittent chest pain and productive cough for 3 days, chest pain worsened with deep breath; history of Huntington's, stroke, hypertension, diabetes mellitus EXAM: CHEST - 2 VIEW COMPARISON:  09/15/2021 FINDINGS: Normal heart size, mediastinal contours, and  pulmonary vascularity. Lungs clear. No pleural effusion or pneumothorax. Bones unremarkable. IMPRESSION: Normal exam. Electronically Signed   By: Lavonia Dana M.D.   On: 12/30/2021 13:59    Procedures Procedures  {Document cardiac monitor, telemetry assessment procedure when appropriate:1}  Medications Ordered in ED Medications  potassium chloride SA (KLOR-CON M) CR tablet 40 mEq (has no administration in time range)    ED Course/ Medical Decision Making/ A&P                           Medical Decision Making Amount and/or Complexity of Data Reviewed Labs: ordered. Radiology: ordered.  Risk Prescription drug management.   ***  {Document critical care time when appropriate:1} {Document review of labs and clinical decision tools ie heart score, Chads2Vasc2 etc:1}  {Document your independent review of radiology images, and any outside records:1} {Document your discussion with family members, caretakers, and with consultants:1} {Document social determinants of health affecting pt's care:1} {Document your decision making why or why not admission, treatments were needed:1} Final Clinical Impression(s) / ED Diagnoses Final diagnoses:  None    Rx / DC Orders ED Discharge Orders     None

## 2022-01-02 ENCOUNTER — Encounter: Payer: Self-pay | Admitting: Physical Medicine & Rehabilitation

## 2022-01-02 ENCOUNTER — Encounter
Payer: Managed Care, Other (non HMO) | Attending: Physical Medicine & Rehabilitation | Admitting: Physical Medicine & Rehabilitation

## 2022-01-02 VITALS — BP 105/61 | HR 74 | Ht 67.0 in | Wt 154.0 lb

## 2022-01-02 DIAGNOSIS — F32A Depression, unspecified: Secondary | ICD-10-CM

## 2022-01-02 DIAGNOSIS — M25561 Pain in right knee: Secondary | ICD-10-CM | POA: Diagnosis present

## 2022-01-02 DIAGNOSIS — I639 Cerebral infarction, unspecified: Secondary | ICD-10-CM | POA: Diagnosis present

## 2022-01-02 DIAGNOSIS — G8929 Other chronic pain: Secondary | ICD-10-CM | POA: Insufficient documentation

## 2022-01-02 DIAGNOSIS — I10 Essential (primary) hypertension: Secondary | ICD-10-CM | POA: Insufficient documentation

## 2022-01-02 NOTE — Progress Notes (Signed)
Subjective:    Patient ID: Hayley Bryant, female    DOB: 05/02/1971, 51 y.o.   MRN: [id number]  HPI Hospital HPI Brief HPI:   Hayley Bryant is a 51 y.o. female who presented to [facility] emergency department on 11/15/2021 with slurred speech and confusion.  Code stroke activated and neurology consulted.  Out of the window for tenecteplase.  CT head and CTA of the head performed without acute findings or LVO.  Aspirin and Plavix therapy started on admission.  She was transferred to [facility].  Numbness of hypertension allowed hypercoagulable work-up negative.  To continue aspirin and Plavix for 3 weeks then aspirin alone.     Hospital Course: Hayley Bryant was admitted to rehab 11/22/2021 for inpatient therapies to consist of PT, ST and OT at least three hours five days a week. Past admission physiatrist, therapy team and rehab RN have worked together to provide customized collaborative inpatient rehab.  Follow-up CBC reveals hemoglobin of 9.6 and stable. Right knee pain reported improved with Tylenol. Attention to sleep hygiene and nutrition. Continued to improve and ready for discharge home with family on 7/7.   Blood pressures were monitored on TID basis and remained controlled on lisinopril and HCTZ.   Diabetes has been monitored with ac/hs CBG checks and SSI was use prn for tighter BS control.  Semglee 17 units daily continued.  Home Jardiance 10 mg daily restarted on 6/26. Trulicity held. Semglee decreased to 15 units daily.     Rehab course: During patient's stay in rehab weekly team conferences were held to monitor patient's progress, set goals and discuss barriers to discharge. At admission, patient required mod assist with basic self-care skills and assist with mobility.   Interval History Hayley Bryant is a 51 year old female who is here for follow-up after she completed CIR after a Left basal ganglia infarct.  She has been doing well overall since going home.  She did have 2 falls since  her discharge.  She reports she fell initially after she got home when she was ambulating.  Since this time she feels like her strength and balance have improved and she was ambulating much better with a walker.  She had a second fall more recently after she tripped over a close basket that was lying on the floor.  She reports no significant injury due to her falls.  She has some mild soreness in her arms since the fall but no areas that are particularly painful or tender. Denies head injury.  She is continuing to work with physical therapy and reporting good progress.  She reports she has physical therapy scheduled for later today.  She has not seen her PCP yet but says she has follow-up planned.  She says her shoulder pain has improved she continues to have mild knee pain but this is well controlled with Tylenol.  Blood pressure and glucose reported to be well controlled since her discharge.  She reports she has all needed medications.  Pain Inventory Average Pain 6 Pain Right Now 6 My pain is aching  LOCATION OF PAIN  arms  BOWEL Number of stools per week: 7 Oral laxative use No  Type of laxative . Enema or suppository use No  History of colostomy No  Incontinent No   BLADDER Normal In and out cath, frequency . Able to self cath  . Bladder incontinence No  Frequent urination Yes  Leakage with coughing Yes  Difficulty starting stream No  Incomplete bladder emptying  No    Mobility walk with assistance use a walker how many minutes can you walk? 10-15 ability to climb steps?  yes do you drive?  no  Function disabled: date disabled . I need assistance with the following:  feeding, dressing, bathing, toileting, meal prep, household duties, and shopping  Neuro/Psych weakness numbness trouble walking spasms dizziness confusion anxiety  Prior Studies Hospital f/u  Physicians involved in your care Hospital f/u   Family History  Problem Relation Age of Onset    Hypertension Mother    Heart attack Mother    Huntington's disease Mother    Huntington's disease Maternal Grandmother    Social History   Socioeconomic History   Marital status: Married    Spouse name: Not on file   Number of children: 3   Years of education: 16   Highest education level: Not on file  Occupational History   Occupation: [employer]  Tobacco Use   Smoking status: Never   Smokeless tobacco: Never  Vaping Use   Vaping Use: Never used  Substance and Sexual Activity   Alcohol use: No   Drug use: No   Sexual activity: Yes    Partners: Male    Birth control/protection: None  Other Topics Concern   Not on file  Social History Narrative   Lives at home w/ her husband and children   Right-handed   Caffeine: drinks coffee or soda when working nights   Social Determinants of Health   Financial Resource Strain: Not on file  Food Insecurity: Not on file  Transportation Needs: Not on file  Physical Activity: Not on file  Stress: Not on file  Social Connections: Not on file   Past Surgical History:  Procedure Laterality Date   CHOLECYSTECTOMY     LYMPH NODE DISSECTION     Past Medical History:  Diagnosis Date   Anxiety    Depression    Diabetes mellitus    Fibroids    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[place])    Hypertension    Stroke ([place])    BP 105/61   Pulse 74   Ht '5\' 7"'$  (1.702 m)   Wt 154 lb (69.9 kg)   LMP 12/23/2021 (Approximate)   SpO2 98%   BMI 24.12 kg/m   Opioid Risk Score:   Fall Risk Score:  `1  Depression screen [facility] 2/9     01/02/2022    3:00 PM 07/22/2013   10:46 AM  Depression screen PHQ 2/9  Decreased Interest 0 1  Down, Depressed, Hopeless 0 1  PHQ - 2 Score 0 2  Altered sleeping 2 3  Tired, decreased energy 0 1  Change in appetite 0 2  Feeling bad or failure about yourself  0 1  Trouble concentrating 2 0  Moving slowly or fidgety/restless 2 2  Suicidal thoughts 0 0  PHQ-9 Score 6 11  Difficult doing work/chores Somewhat  difficult       Review of Systems  Musculoskeletal:  Positive for gait problem.  Neurological:  Positive for dizziness, weakness and numbness.  Psychiatric/Behavioral:  The patient is nervous/anxious.   All other systems reviewed and are negative.      Objective:   Physical Exam Today's Vitals   01/02/22 1450  BP: 105/61  Pulse: 74  SpO2: 98%  Weight: 69.9 kg  Height: '5\' 7"'$  (1.702 m)   Body mass index is 24.12 kg/m.   Gen: no distress, normal appearing HEENT: oral mucosa pink and moist, NCAT Cardio: Reg rate  Chest: normal effort, normal rate of breathing Abd: soft, non-distended Ext: no edema Psych: pleasant, normal affect Skin: intact Neuro: Alert and oriented, follows commands, CN2-12 grossly intact Strength 5/5 left UE 4/5 right UE Strength 4+/5 LE UE 4+/5 right LE Moderate dysarthria improved Decreased fine motor movement in RUE Musculoskeletal: Mild R knee tenderness Minimal soreness in b/l forearms and arms Walking with walker No abnormal tone noted       Assessment & Plan:  Medical Problem List and Plan: 1. Left basal ganglia infarct, appears to making good progress with improvement in strength since discharge from CIR            -Continue outpatient PT, OT SLP  -Continue ASA  -Discussed fall prevention  -Continue walker for ambulation  2.  Right knee pain   -continue tylenol, volaren gel PRN   3. Hypertension: well controlled, Continue lisinopril/HCTZ  -F/U with PCP for further adjustments

## 2022-01-03 ENCOUNTER — Ambulatory Visit: Payer: Managed Care, Other (non HMO)

## 2022-01-03 ENCOUNTER — Ambulatory Visit: Payer: Managed Care, Other (non HMO) | Attending: Physician Assistant | Admitting: Physical Therapy

## 2022-01-03 ENCOUNTER — Encounter: Payer: Self-pay | Admitting: Physical Therapy

## 2022-01-03 DIAGNOSIS — R4701 Aphasia: Secondary | ICD-10-CM | POA: Diagnosis present

## 2022-01-03 DIAGNOSIS — R471 Dysarthria and anarthria: Secondary | ICD-10-CM | POA: Diagnosis present

## 2022-01-03 DIAGNOSIS — R482 Apraxia: Secondary | ICD-10-CM

## 2022-01-03 DIAGNOSIS — R41841 Cognitive communication deficit: Secondary | ICD-10-CM | POA: Diagnosis present

## 2022-01-03 DIAGNOSIS — I69351 Hemiplegia and hemiparesis following cerebral infarction affecting right dominant side: Secondary | ICD-10-CM | POA: Diagnosis present

## 2022-01-03 DIAGNOSIS — R2681 Unsteadiness on feet: Secondary | ICD-10-CM | POA: Diagnosis present

## 2022-01-03 DIAGNOSIS — R278 Other lack of coordination: Secondary | ICD-10-CM | POA: Diagnosis present

## 2022-01-03 DIAGNOSIS — R2689 Other abnormalities of gait and mobility: Secondary | ICD-10-CM | POA: Insufficient documentation

## 2022-01-03 DIAGNOSIS — M6281 Muscle weakness (generalized): Secondary | ICD-10-CM | POA: Insufficient documentation

## 2022-01-03 NOTE — Patient Instructions (Signed)
Access Code: [facility] URL: [email or web address] Date: 01/03/2022 Prepared by: Elease Etienne  Exercises - Seated Hip Abduction with Resistance  - 1 x daily - 5-6 x weekly - 4 sets - 5 reps - Seated Hip Adduction Isometrics with Ball  - 1 x daily - 5-6 x weekly - 2 sets - 10 reps - 1-2 seconds hold - Standing March with Counter Support  - 1 x daily - 5 x weekly - 2 sets - 20 reps

## 2022-01-03 NOTE — Patient Instructions (Addendum)
Cueing Hierarchy:  Description - define the word or provide the function First letter - provide the first letter of the target word Spell - spell the target word, orally Phrase Completion - give a phrase to complete First sound - provide the first sound of the target word Model - say the word aloud to repeat   Example target: "mug" The thing you put coffee in Starts with m [place] my coffee _____ "Muh" Mug

## 2022-01-03 NOTE — Therapy (Signed)
OUTPATIENT PHYSICAL THERAPY TREATMENT NOTE   Patient Name: Hayley Bryant MRN: [id number] DOB:[birth year], 51 y.o., female Today's Date: 01/03/2022  PCP: Barrie Lyme, FNP REFERRING PROVIDER: Cathlyn Parsons, PA-C   END OF SESSION:   PT End of Session - 01/03/22 0848     Visit Number 2    Number of Visits 17   16+eval   Date for PT Re-Evaluation 03/07/22   pushed out due to patient preference for same clinician and delay with scheduling.   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[place])    Hypertension    Stroke [facility])    Past Surgical History:  Procedure Laterality Date   CHOLECYSTECTOMY     LYMPH NODE DISSECTION     Patient Active Problem List   Diagnosis Date Noted   Acute ischemic stroke ([place]) 11/22/2021   Acute CVA (cerebrovascular accident) ([place]) 11/15/2021   Abnormal LFTs 11/15/2021   Hyperlipidemia associated with type 2 diabetes mellitus ([place]) 09/07/2017   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   Uncontrolled type 2 diabetes mellitus with hyperglycemia ([name]) 05/07/2017   Overweight (BMI 25.0-29.9) 09/20/2013   Difficulty sleeping 09/20/2013   Vitamin D deficiency 06/21/2013   Suicidal overdose ([place]) 12/06/2012   HTN (hypertension) 12/06/2012   Type 2 diabetes mellitus ([place]) 12/06/2012   Depression 07/13/2012   Anxiety 07/13/2012    REFERRING DIAG: I63.9 (ICD-10-CM) - Cerebral infarction, unspecified   THERAPY DIAG:  Hemiplegia and hemiparesis following cerebral infarction affecting right dominant side (HCC)  Other abnormalities of gait and mobility  Muscle weakness  (generalized)  Unsteadiness on feet  Other lack of coordination  Rationale for Evaluation and Treatment Rehabilitation  PERTINENT HISTORY: Presented to [facility] emergency department on 11/15/2021 with slurred speech and confusion.  CT head and CTA of the head performed without acute findings or LVO. MRI found left basal ganglia infarct.  Admitted to rehab 11/22/2021 until 12/06/2021.   PMH:  Suicidal overdose, HTN, DM2, Acute CVA, depression, Huntington's like disease, hyperlipidemia, anxiety  PRECAUTIONS: Fall  SUBJECTIVE: Son states pt had a fall 2 days ago when she tripped over a bucket of clothes that she did not see.  She states her epigastric pain from recent ED visit is resolved, son states she is on a special diet for unknown amount of time as well as acid reflux meds.  Pt accompanied by: family member-son-Renick  PAIN:  Are you having pain? Yes: NPRS scale: 6/10 Pain location: bilateral UE Pain description: numb, achy, intermittent Aggravating factors: moving them Relieving factors: tylenol   OBJECTIVE: (objective measures completed at initial evaluation unless otherwise dated)   TODAY'S TREATMENT:   Time spent setting up FOTO and reading questions to patient to complete survey.    Assessed BERG:  [facility] PT Assessment - 01/03/22 0903       Berg Balance Test   Sit to Stand Able to stand  independently using hands    Standing Unsupported Able to stand 2 minutes with supervision    Sitting  with Back Unsupported but Feet Supported on Floor or Stool Able to sit safely and securely 2 minutes    Stand to Sit Uses backs of legs against chair to control descent    Transfers Able to transfer with verbal cueing and /or supervision    Standing Unsupported with Eyes Closed Able to stand 10 seconds with supervision    Standing Unsupported with Feet Together Able to place feet together independently and stand for 1 minute with supervision    From Standing, Reach Forward with  Outstretched Arm Can reach forward >12 cm safely (5")    From Standing Position, Pick up Object from [place] to pick up shoe, needs supervision    From Standing Position, Turn to Look Behind Over each Shoulder Turn sideways only but maintains balance    Turn 360 Degrees Needs close supervision or verbal cueing    Standing Unsupported, Alternately Place Feet on Step/Stool Able to complete 4 steps without aid or supervision    Standing Unsupported, One Foot in [place] to take small step independently and hold 30 seconds    Standing on One Leg Tries to lift leg/unable to hold 3 seconds but remains standing independently    Total Score 34            Initiated HEP: -seated hip abduction with green theraband 3x5 -seated hip adduction w/ pillow 15x1sec hold -Standing march 2x20 at counter    PATIENT EDUCATION: Education details: Assessment outcomes, safety with initial HEP using supervision-spoke to son in lobby at end of session as he left after subjective taken. Person educated: Patient and Son Education method: Explanation Education comprehension: verbalized understanding and needs further education     HOME EXERCISE PROGRAM: Access Code: [facility] URL: [email or web address] Date: 01/03/2022 Prepared by: Elease Etienne  Exercises - Seated Hip Abduction with Resistance  - 1 x daily - 5-6 x weekly - 4 sets - 5 reps - Seated Hip Adduction Isometrics with Ball  - 1 x daily - 5-6 x weekly - 2 sets - 10 reps - 1-2 seconds hold - Standing March with Counter Support  - 1 x daily - 5 x weekly - 2 sets - 20 reps     GOALS: Goals reviewed with patient? Yes   SHORT TERM GOALS: Target date: 01/24/2022   Pt will perform initial strength and balance HEP with supervision from family. Baseline:   To be established. Goal status: INITIAL   2.  Pt will decrease 5xSTS to </= 20 seconds w/ UE support in order to demonstrate decreased risk for falls and improved functional  bilateral LE strength and power. Baseline: 24.00 sec w/ BUE Goal status: INITIAL   3.  Pt will demonstrate TUG of </=13 seconds safely in order to decrease risk of falls and improve functional mobility using LRAD. Baseline: 15.60 sec w/ RW, impulsive Goal status: INITIAL   4.  Pt will demonstrate a gait speed of >/=2.0 feet/sec w/ LRAD in order to decrease risk for falls. Baseline: 1.73 ft/sec w/ RW Goal status: INITIAL   5.  Pt will increase BERG balance score to >/=40/56 to demonstrate improved static balance. Baseline: 34/56 Goal status: INITIAL   LONG TERM GOALS: Target date: 02/21/2022   Pt will be compliant to structured walking program at least 3 days per week with supervision from sons or husband to promote maintained aerobic gains. Baseline: To be established. Goal status: INITIAL   2.  Pt will decrease 5xSTS to </=16 seconds w/ or w/o  UE support in order to demonstrate decreased risk for falls and improved functional bilateral LE strength and power. Baseline: 24.00 sec w/ BUE support Goal status: INITIAL   3.  Pt will demonstrate a gait speed of >/=2.3 feet/sec w/ LRAD in order to decrease risk for falls. Baseline: 1.73 ft/sec w/ RW Goal status: INITIAL   4.  Pt will ambulate >/= 500 feet on level surfaces with LRAD and supervision level of assist to promote household and community access. Baseline: 115' SBA/CGA w/ RW Goal status: INITIAL   5.  Pt will increase FOTO score to 71% in order to demonstrate subjective functional improvement. Baseline: 58% Goal status: INITIAL   6.  Pt will increase BERG balance score to >/= 45/56 to demonstrate improved static balance. Baseline: 34/56 Goal status: INITIAL   ASSESSMENT:   CLINICAL IMPRESSION: Assessed FOTO and finished assessing BERG this visit with pt scoring a 58% on FOTO and 34/56 on BERG indicating a low perceived functional capability and impaired static balance with significant fall risk.  Time spent initiating  HEP for hip strengthening primarily, provided education on supervision for safety to son.  Pt would continue to benefit from skilled PT to address noted impairments outlined in POC.   OBJECTIVE IMPAIRMENTS Abnormal gait, decreased activity tolerance, decreased balance, decreased cognition, decreased coordination, decreased endurance, decreased knowledge of condition, decreased knowledge of use of DME, decreased mobility, difficulty walking, decreased strength, decreased safety awareness, impaired UE functional use, improper body mechanics, and postural dysfunction.    ACTIVITY LIMITATIONS carrying, lifting, bending, sitting, standing, squatting, stairs, transfers, bed mobility, bathing, dressing, self feeding, hygiene/grooming, and locomotion level   PARTICIPATION LIMITATIONS: meal prep, cleaning, laundry, medication management, personal finances, interpersonal relationship, driving, shopping, community activity, and occupation   PERSONAL FACTORS Age, Behavior pattern, Fitness, Past/current experiences, Time since onset of injury/illness/exacerbation, Transportation, and 3+ comorbidities: HTN, DM2, acute CVA, depression, Huntington's like disease for ~7 years  are also affecting patient's functional outcome.    REHAB POTENTIAL: [place] personal factors and PMH   CLINICAL DECISION MAKING: Evolving/moderate complexity   EVALUATION COMPLEXITY: Moderate   PLAN: PT FREQUENCY: 2x/week   PT DURATION: 8 weeks   PLANNED INTERVENTIONS: Therapeutic exercises, Therapeutic activity, Neuromuscular re-education, Balance training, Gait training, Patient/Family education, Self Care, Stair training, Vestibular training, DME instructions, Manual therapy, and Re-evaluation   PLAN FOR NEXT SESSION: Modify HEP for posture in standing/LE strength/dynamic balance, gait training w/ RW, SciFit for endurance/strengthening, hip/quad strength, coordination tasks   Bary Richard, PT, DPT 01/03/2022, 10:33 AM

## 2022-01-03 NOTE — Therapy (Signed)
OUTPATIENT SPEECH LANGUAGE PATHOLOGY TREATMENT NOTE   Patient Name: Hayley Bryant MRN: [id number] DOB:1970/10/07, 51 y.o., female Today's Date: 01/03/2022  PCP: Barrie Lyme  REFERRING PROVIDER: Cathlyn Parsons, PA-C   END OF SESSION:   End of Session - 01/03/22 0750     Visit Number 2    Number of Visits 17    Date for SLP Re-Evaluation 02/14/22    Authorization Type [employer]    SLP Start Time 0800    SLP Stop Time  0845    SLP Time Calculation (min) 45 min    Activity Tolerance Patient tolerated treatment well             Past Medical History:  Diagnosis Date   Anxiety    Depression    Diabetes mellitus    Fibroids    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   Uncontrolled type 2 diabetes mellitus with hyperglycemia ([place]) 05/07/2017   Overweight (BMI 25.0-29.9) 09/20/2013   Difficulty sleeping 09/20/2013   Vitamin D deficiency 06/21/2013   Suicidal overdose ([place]) 12/06/2012   HTN (hypertension) 12/06/2012   Type 2 diabetes mellitus ([place]) 12/06/2012   Depression 07/13/2012   Anxiety 07/13/2012    ONSET DATE:  12/04/2021  REFERRING DIAG: I63.9 (ICD-10-CM) - Cerebral infarction, unspecified   THERAPY DIAG:  Aphasia  Verbal apraxia  Dysarthria and anarthria  Cognitive communication deficit  Rationale for Evaluation and Treatment Rehabilitation  SUBJECTIVE: "I fell two days ago"  PAIN:  Are you having pain? Yes NPRS scale: 6/10 Pain location: bilateral arm   OBJECTIVE:  TODAY'S TREATMENT:  01-03-22: Pt accompanied by eldest son  today. Son endorsed her voice has not been as faint or weak in last several days. 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 Pt is experiencing ongoing dysnomia and anomia. SLP educated and instructed dysarthria compensations (slow rate, over-articulation, and volume), in which pt able to demonstrate at word and sentence level with usual fading to occasional min A. Demonstrated improving awareness of reduced volume and increased rate during structured tasks. Educated anomia compensations and targeted use of description strategy today via Chartered loss adjuster. Pt benefited from usual fading to occasional min prompting to verbalize particular components. SLP educated son on cuing hierarchy and allowing patient to have additional processing time, especially when word finding occurs.   12-19-21: Educated patient and children on clinical observations and recommendations for initiate ST POC to address aphasia, apraxia, dysarthria, and cognition. All verbalized understanding and agreement.      PATIENT EDUCATION: Education details: see above Person educated: Patient and Child(ren) Education method: Customer service manager Education comprehension: verbalized understanding, returned demonstration, and needs further education     GOALS: Goals reviewed with patient? Yes   SHORT TERM GOALS: Target date: 01/16/2022     Pt will ID and attempt to correct speech errors (apraxic errors/dysnomia) with 80% accuracy on structured tasks given occasional mod A over sessions  Baseline: Goal status: ongoing   2.  Pt will implement dysarthria compensations  to be 90+% intelligible in 5-10 minute conversations given occasional mod A over 2 sessions Baseline:  Goal status: ongoing   3.   Pt will utilize word retrieval strategies on structured speech tasks with 80% accuracy given occasional mod A over 2 sessions  Baseline:  Goal status: ongoing   4.  Pt will use  cognitive compensations to aid recall of functional information given occasional mod A over 2 sessions  Baseline:  Goal status: ongoing   5.  Family will provide appropriate cues to support cognitive communication skills as demonstrated in [place] sessions given occasional min A over 2 sessions Baseline:  Goal status: ongoing       LONG TERM GOALS: Target date: 02/14/2022    Pt will ID and attempt to correct speech errors (apraxic errors/dysnomia) with 90% accuracy in conversation given occasional min A over 2 sessions  Baseline:  Goal status: ongoing   2.  Pt will use trained dysarthria compensations to be 95+% intelligible in 15+ minute conversations given occasional min A over 2 sessions Baseline:  Goal status: ongoing   3.  Pt will utilize word retrieval strategies to optimize ability to functionally communicate in personally relevant conversations given occasional min A over 2 sessions Baseline:  Goal status: ongoing   4.  Pt will use cognitive compensations to aid recall of functional information given occasional min A over 2 sessions Baseline:  Goal status: ongoing   5.  Pt will report improved communication effectiveness via PROM by 2 points at last ST session  Baseline: CPIB=15 Goal status: ongoing     ASSESSMENT:   CLINICAL IMPRESSION: Patient is a 51 y.o. female who was seen today for CVA in June 2023. PMHX significant for anxiety, depression, and Huntington like disease. Prior difficulty with talking, memory, and walking reported. Endorsed recent communication changes including "stumbling" with words and difficulty with word retrieval. Pt presents with intermittent unintelligible responses related to apraxia and dysarthria, semantic paraphasias, use of vague language, and reduced conversational volume. Pt now requires increased repetition to aid recall of novel information. Initiated education and instruction of aphasia and dysarthria compensations, in which pt able to  demonstrate with usual fading to occasional min A on structured speech tasks today. Pt would benefit from skilled ST intervention to address speech, language, and cognitive changes to optimize communication effectiveness and functional independence.    OBJECTIVE IMPAIRMENTS include memory, aphasia, apraxia, and dysarthria. These impairments are limiting patient from household responsibilities and effectively communicating at home and in community. Factors affecting potential to achieve goals and functional outcome are medical prognosis and previous level of function. Patient will benefit from skilled SLP services to address above impairments and improve overall function.   REHAB POTENTIAL: Good   PLAN: SLP FREQUENCY: 2x/week   SLP DURATION: 8 weeks   PLANNED INTERVENTIONS: Language facilitation, Environmental controls, Cueing hierachy, Cognitive reorganization, Internal/external aids, Functional tasks, Multimodal communication approach, SLP instruction and feedback, Compensatory    Marzetta Board, CCC-SLP 01/03/2022, 10:28 AM

## 2022-01-06 ENCOUNTER — Encounter: Payer: Self-pay | Admitting: Physical Medicine & Rehabilitation

## 2022-01-07 ENCOUNTER — Encounter: Payer: Self-pay | Admitting: Physical Therapy

## 2022-01-07 ENCOUNTER — Ambulatory Visit: Payer: Managed Care, Other (non HMO)

## 2022-01-07 ENCOUNTER — Ambulatory Visit: Payer: Managed Care, Other (non HMO) | Admitting: Physical Therapy

## 2022-01-07 DIAGNOSIS — R4701 Aphasia: Secondary | ICD-10-CM

## 2022-01-07 DIAGNOSIS — I69351 Hemiplegia and hemiparesis following cerebral infarction affecting right dominant side: Secondary | ICD-10-CM | POA: Diagnosis not present

## 2022-01-07 DIAGNOSIS — R41841 Cognitive communication deficit: Secondary | ICD-10-CM

## 2022-01-07 DIAGNOSIS — R471 Dysarthria and anarthria: Secondary | ICD-10-CM

## 2022-01-07 DIAGNOSIS — R2689 Other abnormalities of gait and mobility: Secondary | ICD-10-CM

## 2022-01-07 DIAGNOSIS — M6281 Muscle weakness (generalized): Secondary | ICD-10-CM

## 2022-01-07 DIAGNOSIS — R278 Other lack of coordination: Secondary | ICD-10-CM

## 2022-01-07 DIAGNOSIS — R2681 Unsteadiness on feet: Secondary | ICD-10-CM

## 2022-01-07 NOTE — Therapy (Signed)
OUTPATIENT SPEECH LANGUAGE PATHOLOGY TREATMENT NOTE   Patient Name: Hayley Bryant MRN: [id number] DOB:[birth year], 51 y.o., female Today's Date: 01/07/2022  PCP: Barrie Lyme  REFERRING PROVIDER: Cathlyn Parsons, PA-C   END OF SESSION:   End of Session - 01/07/22 0844     Visit Number 3    Number of Visits 17    Date for SLP Re-Evaluation 02/14/22    Authorization Type [employer]    SLP Start Time 0845    SLP Stop Time  0928    SLP Time Calculation (min) 43 min    Activity Tolerance Patient tolerated treatment well              Past Medical History:  Diagnosis Date   Anxiety    Depression    Diabetes mellitus    Fibroids    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[place])    Hypertension    Stroke [facility])    Past Surgical History:  Procedure Laterality Date   CHOLECYSTECTOMY     LYMPH NODE DISSECTION     Patient Active Problem List   Diagnosis Date Noted   Acute ischemic stroke ([place]) 11/22/2021   Acute CVA (cerebrovascular accident) ([place]) 11/15/2021   Abnormal LFTs 11/15/2021   Hyperlipidemia associated with type 2 diabetes mellitus ([place]) 09/07/2017   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   Uncontrolled type 2 diabetes mellitus with hyperglycemia ([place]) 05/07/2017   Overweight (BMI 25.0-29.9) 09/20/2013   Difficulty sleeping 09/20/2013   Vitamin D deficiency 06/21/2013   Suicidal overdose ([place]) 12/06/2012   HTN (hypertension) 12/06/2012   Type 2 diabetes mellitus ([place]) 12/06/2012   Depression 07/13/2012   Anxiety 07/13/2012    ONSET DATE:  12/04/2021  REFERRING DIAG: I63.9 (ICD-10-CM) - Cerebral infarction, unspecified   THERAPY DIAG:  Dysarthria and anarthria  Aphasia  Cognitive communication deficit  Rationale for Evaluation and Treatment Rehabilitation  SUBJECTIVE: "Good"  PAIN:  Are you having pain? No  OBJECTIVE:  TODAY'S TREATMENT:  01-07-22: Pt reported practicing dysarthria strategies for HEP and recalled 2/3 targeted strategies independently.  Targeted use of dysarthria strategies (loud, slow rate, and over-articulation) at paragraph level today. Reduced awareness and recognition of decline in volume, increased rate, and reduced articulatory precision exhibited today. SLP engaged family members in building patient awareness to target slowing rate and increasing loudness. Trialed varied visual aids to slow rate (tapping each word was most effective but inconsistent - benefited from SLP assistance to focus on each and every word). Loudness improved from mid 60s to low 70s dB during first 1-2 sentences and decayed as paragraph progressed. Verbal cues increased decaying volume to upper 60s dB. Oral motor fatigue reported during structured practice. Recommended patient practice reading aloud with focus on strategies at least 10 minutes/twice a day to build patient endurance. Also re-educated use of description strategy to aid word retrieval at home with family prompting patient to describe or providing descriptions to aid anomia. All verbalized understanding and agreement with SLP recommendations.   01-03-22: Pt accompanied by eldest son today. Son endorsed her voice has not been as faint or weak in last several days. 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 Pt is experiencing ongoing dysnomia and anomia. SLP educated and instructed dysarthria compensations (slow rate, over-articulation, and volume), in which pt able to demonstrate at word and sentence level with usual fading to occasional min A. Demonstrated improving awareness of reduced volume and increased rate during structured tasks. Educated anomia compensations and targeted  use of description strategy today via Chartered loss adjuster. Pt benefited from usual fading to occasional min prompting to verbalize particular components. SLP educated son on cuing hierarchy and allowing patient to have additional processing time, especially when word finding occurs.    12-19-21: Educated patient and children on clinical observations and recommendations for initiate ST POC to address aphasia, apraxia, dysarthria, and cognition. All verbalized understanding and agreement.      PATIENT EDUCATION: Education details: see above Person educated: Patient and Child(ren) Education method: Customer service manager Education comprehension: verbalized understanding, returned demonstration, and needs further education     GOALS: Goals reviewed with patient? Yes   SHORT TERM GOALS: Target date: 01/16/2022     Pt will ID and attempt to correct speech errors (apraxic errors/dysnomia) with 80% accuracy on structured tasks given occasional mod A over sessions  Baseline: Goal status: ongoing   2.  Pt will implement dysarthria compensations to be 90+% intelligible in 5-10 minute conversations given occasional mod A over 2 sessions Baseline:  Goal status: ongoing   3.   Pt will utilize word retrieval strategies on structured speech tasks with 80% accuracy given occasional mod A over 2 sessions  Baseline: 01-07-22 Goal status: ongoing   4.  Pt will use cognitive compensations to aid recall of functional information given occasional mod A over 2 sessions  Baseline:  Goal status: ongoing   5.  Family will provide appropriate cues to support cognitive communication skills as demonstrated in [place] sessions given occasional min A over 2 sessions Baseline: 01-07-22 Goal status: ongoing       LONG TERM GOALS: Target date: 02/14/2022    Pt will ID and attempt to correct speech errors (apraxic errors/dysnomia) with 90% accuracy in conversation given occasional min A over 2 sessions  Baseline:  Goal status: ongoing   2.  Pt will use trained dysarthria compensations to be 95+% intelligible in 15+ minute conversations given occasional min A over 2 sessions Baseline:  Goal status: ongoing   3.  Pt will utilize word retrieval strategies to optimize ability to functionally  communicate in personally relevant conversations given occasional min A over 2 sessions Baseline:  Goal status: ongoing   4.  Pt will use cognitive compensations to aid recall of functional information given occasional min A over 2 sessions Baseline:  Goal status: ongoing   5.  Pt will report improved communication effectiveness via PROM by 2 points at last ST session  Baseline: CPIB=15 Goal status: ongoing     ASSESSMENT:   CLINICAL IMPRESSION: Patient is a 51 y.o. female who was seen today for CVA in June 2023. PMHX significant for anxiety, depression, and Huntington like disease. Prior difficulty with talking, memory, and walking reported. Endorsed recent communication changes including "stumbling" with words and difficulty with word retrieval. Pt presents with intermittent unintelligible responses related to apraxia and dysarthria, semantic paraphasias, use of vague language, and reduced conversational volume. Pt now requires increased repetition to aid recall of novel information. Conducted ongoing education and instruction of aphasia and dysarthria compensations, in which pt able to demonstrate with usual min to mod A on structured speech tasks today. Pt would benefit from skilled ST intervention to address speech, language, and cognitive changes to optimize communication effectiveness and functional independence.    OBJECTIVE IMPAIRMENTS include memory, aphasia, apraxia, and dysarthria. These impairments are limiting patient from household responsibilities and effectively communicating at home and in community. Factors affecting potential to achieve goals and functional outcome are  medical prognosis and previous level of function. Patient will benefit from skilled SLP services to address above impairments and improve overall function.   REHAB POTENTIAL: Good   PLAN: SLP FREQUENCY: 2x/week   SLP DURATION: 8 weeks   PLANNED INTERVENTIONS: Language facilitation, Environmental  controls, Cueing hierachy, Cognitive reorganization, Internal/external aids, Functional tasks, Multimodal communication approach, SLP instruction and feedback, Compensatory    Marzetta Board, CCC-SLP 01/07/2022, 11:18 AM

## 2022-01-07 NOTE — Therapy (Addendum)
OUTPATIENT PHYSICAL THERAPY TREATMENT NOTE   Patient Name: Hayley Bryant MRN: [id number] DOB:03-08-1971, 51 y.o., female Today's Date: 01/07/2022  PCP: Barrie Lyme, FNP REFERRING PROVIDER: Cathlyn Parsons, PA-C   END OF SESSION:   PT End of Session - 01/07/22 0932     Visit Number 3    Number of Visits 17   16+eval   Date for PT Re-Evaluation 03/07/22   pushed out due to patient preference for same clinician and delay with scheduling.   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   Uncontrolled type 2 diabetes mellitus with hyperglycemia ([place]) 05/07/2017   Overweight (BMI 25.0-29.9) 09/20/2013   Difficulty sleeping 09/20/2013   Vitamin D deficiency 06/21/2013   Suicidal overdose ([place]) 12/06/2012   HTN (hypertension) 12/06/2012   Type 2 diabetes mellitus ([place]) 12/06/2012   Depression 07/13/2012   Anxiety 07/13/2012    REFERRING DIAG: I63.9 (ICD-10-CM) - Cerebral infarction, unspecified   THERAPY DIAG:  Hemiplegia and hemiparesis following cerebral infarction affecting right dominant side (HCC)  Other abnormalities of gait and mobility  Muscle weakness  (generalized)  Unsteadiness on feet  Other lack of coordination  Rationale for Evaluation and Treatment Rehabilitation  PERTINENT HISTORY: Presented to [facility] long emergency department on 11/15/2021 with slurred speech and confusion.  CT head and CTA of the head performed without acute findings or LVO. MRI found left basal ganglia infarct.  Admitted to rehab 11/22/2021 until 12/06/2021.   PMH:  Suicidal overdose, HTN, DM2, Acute CVA, depression, Huntington's like disease, hyperlipidemia, anxiety  PRECAUTIONS: Fall  SUBJECTIVE:  Pt reports no acute changes or recent falls.  She states she has done 2 of 3 exercises, not done the standing march due to not having supervision from family.  Pt accompanied by: family member-daughter and daughter's fiance  PAIN:  Are you having pain? No   OBJECTIVE: (objective measures completed at initial evaluation unless otherwise dated)   TODAY'S TREATMENT:   -STS x10, see edu for discussion. -Reviewed standing march as pt has not practiced this at home x20  -SLS w/ BUE support on counter -Backwards walking at counter 6x8' w/ unilateral UE support and CGA -6' step ups w/ Bil hand rails 2x12 alt LE  GAIT: Gait pattern: step to pattern, step through pattern, decreased step length- Left, decreased stance time- Right, decreased stride length, and narrow BOS Distance walked: 460' Assistive device utilized: Environmental consultant - 2 wheeled Level of assistance: SBA and CGA Comments: Pt demos lifting RW from ground frequently to reposition despite cuing for fluidity  of management.  Cued for appropriate approximation to AD especially during turning, and cues to promote widened BOS.  Pt frequently looks at feet during ambulation making motor planning and safety difficult.    PATIENT EDUCATION: Education details: Discussed practicing STS from varying height surfaces in home with close supervision and guarding by blocking knee if necessary with emphasis on patient scooting to  edge of surface to stand.  Discussed using hands to appropriately find the edge of surfaces without sliding off and practicing sitting safely on edge of high bed at home. Person educated: Patient and Daughter and Daughter's fiance. Education method: Explanation Education comprehension: verbalized understanding and needs further education     HOME EXERCISE PROGRAM: Access Code: [email or web address]    GOALS: Goals reviewed with patient? Yes   SHORT TERM GOALS: Target date: 01/24/2022   Pt will perform initial strength and balance HEP with supervision from family. Baseline:   To be established. Goal status: INITIAL   2.  Pt will decrease 5xSTS to </= 20 seconds w/ UE support in order to demonstrate decreased risk for falls and improved functional bilateral LE strength and power. Baseline: 24.00 sec w/ BUE Goal status: INITIAL   3.  Pt will demonstrate TUG of </=13 seconds safely in order to decrease risk of falls and improve functional mobility using LRAD. Baseline: 15.60 sec w/ RW, impulsive Goal status: INITIAL   4.  Pt will demonstrate a gait speed of >/=2.0 feet/sec w/ LRAD in order to decrease risk for falls. Baseline: 1.73 ft/sec w/ RW Goal status: INITIAL   5.  Pt will increase BERG balance score to >/=40/56 to demonstrate improved static balance. Baseline: 34/56 Goal status: INITIAL   LONG TERM GOALS: Target date: 02/21/2022   Pt will be compliant to structured walking program at least 3 days per week with supervision from sons or husband to promote maintained aerobic gains. Baseline: To be established. Goal status: INITIAL   2.  Pt will decrease 5xSTS to </=16 seconds w/ or w/o UE support in order to demonstrate decreased risk for falls and improved functional bilateral LE strength and power. Baseline: 24.00 sec w/ BUE support Goal status: INITIAL   3.  Pt will demonstrate a gait speed of >/=2.3 feet/sec w/ LRAD in order to decrease risk for falls. Baseline: 1.73 ft/sec w/  RW Goal status: INITIAL   4.  Pt will ambulate >/= 500 feet on level surfaces with LRAD and supervision level of assist to promote household and community access. Baseline: 115' SBA/CGA w/ RW Goal status: INITIAL   5.  Pt will increase FOTO score to 71% in order to demonstrate subjective functional improvement. Baseline: 58% Goal status: INITIAL   6.  Pt will increase BERG balance score to >/= 45/56 to demonstrate improved static balance. Baseline: 34/56 Goal status: INITIAL   ASSESSMENT:   CLINICAL IMPRESSION: Made additions to existing HEP with instructions and demonstration to daughter and daughter's fiance for safety and supervision of patient.  Pt remains challenged by RW management during ambulation and difficulty maintaining appropriate width BOS.  She tolerates functional strengthening well this session demonstrating increased fatigue in the right hip musculature during step ups.  Will continue to address deficits as noted with skilled intervention as outlined in POC as pt continues to progress towards LTGs.   OBJECTIVE IMPAIRMENTS Abnormal gait, decreased activity tolerance, decreased balance, decreased cognition, decreased coordination, decreased endurance, decreased knowledge of condition, decreased knowledge of use of DME, decreased mobility, difficulty walking, decreased strength, decreased  safety awareness, impaired UE functional use, improper body mechanics, and postural dysfunction.    ACTIVITY LIMITATIONS carrying, lifting, bending, sitting, standing, squatting, stairs, transfers, bed mobility, bathing, dressing, self feeding, hygiene/grooming, and locomotion level   PARTICIPATION LIMITATIONS: meal prep, cleaning, laundry, medication management, personal finances, interpersonal relationship, driving, shopping, community activity, and occupation   PERSONAL FACTORS Age, Behavior pattern, Fitness, Past/current experiences, Time since onset of injury/illness/exacerbation,  Transportation, and 3+ comorbidities: HTN, DM2, acute CVA, depression, Huntington's like disease for ~7 years  are also affecting patient's functional outcome.    REHAB POTENTIAL: Fair See personal factors and PMH   CLINICAL DECISION MAKING: Evolving/moderate complexity   EVALUATION COMPLEXITY: Moderate   PLAN: PT FREQUENCY: 2x/week   PT DURATION: 8 weeks   PLANNED INTERVENTIONS: Therapeutic exercises, Therapeutic activity, Neuromuscular re-education, Balance training, Gait training, Patient/Family education, Self Care, Stair training, Vestibular training, DME instructions, Manual therapy, and Re-evaluation   PLAN FOR NEXT SESSION: Modify HEP for posture in standing/LE strength/dynamic balance, gait training w/ RW, SciFit for endurance/strengthening, hip/quad strength, coordination tasks, RW management w/ turns/inclines/obstacles/approximation to AD and contact with ground, static balance w/o UE support   Bary Richard, PT, DPT 01/07/2022, 11:27 AM

## 2022-01-10 ENCOUNTER — Encounter: Payer: Self-pay | Admitting: Physical Therapy

## 2022-01-10 ENCOUNTER — Ambulatory Visit: Payer: Managed Care, Other (non HMO)

## 2022-01-10 ENCOUNTER — Other Ambulatory Visit: Payer: Self-pay | Admitting: Physician Assistant

## 2022-01-10 ENCOUNTER — Ambulatory Visit: Payer: Managed Care, Other (non HMO) | Admitting: Physical Therapy

## 2022-01-10 DIAGNOSIS — I69351 Hemiplegia and hemiparesis following cerebral infarction affecting right dominant side: Secondary | ICD-10-CM | POA: Diagnosis not present

## 2022-01-10 DIAGNOSIS — R2689 Other abnormalities of gait and mobility: Secondary | ICD-10-CM

## 2022-01-10 DIAGNOSIS — R278 Other lack of coordination: Secondary | ICD-10-CM

## 2022-01-10 DIAGNOSIS — R2681 Unsteadiness on feet: Secondary | ICD-10-CM

## 2022-01-10 DIAGNOSIS — R4701 Aphasia: Secondary | ICD-10-CM

## 2022-01-10 DIAGNOSIS — M6281 Muscle weakness (generalized): Secondary | ICD-10-CM

## 2022-01-10 DIAGNOSIS — R471 Dysarthria and anarthria: Secondary | ICD-10-CM

## 2022-01-10 DIAGNOSIS — R482 Apraxia: Secondary | ICD-10-CM

## 2022-01-10 DIAGNOSIS — R41841 Cognitive communication deficit: Secondary | ICD-10-CM

## 2022-01-10 NOTE — Therapy (Signed)
OUTPATIENT PHYSICAL THERAPY TREATMENT NOTE   Patient Name: KELEIGH KAZEE MRN: [id number] DOB:03-28-1971, 51 y.o., female Today's Date: 01/10/2022  PCP: Barrie Lyme, FNP REFERRING PROVIDER: Cathlyn Parsons, PA-C   END OF SESSION:   PT End of Session - 01/10/22 0935     Visit Number 4    Number of Visits 17   16+eval   Date for PT Re-Evaluation 03/07/22   pushed out due to patient preference for same clinician and delay with scheduling.   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[place])    Hypertension    Stroke [facility])    Past Surgical History:  Procedure Laterality Date   CHOLECYSTECTOMY     LYMPH NODE DISSECTION     Patient Active Problem List   Diagnosis Date Noted   Acute ischemic stroke ([place]) 11/22/2021   Acute CVA (cerebrovascular accident) ([place]) 11/15/2021   Abnormal LFTs 11/15/2021   Hyperlipidemia associated with type 2 diabetes mellitus ([place]) 09/07/2017   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   Uncontrolled type 2 diabetes mellitus with hyperglycemia ([place]) 05/07/2017   Overweight (BMI 25.0-29.9) 09/20/2013   Difficulty sleeping 09/20/2013   Vitamin D deficiency 06/21/2013   Suicidal overdose ([place]) 12/06/2012   HTN (hypertension) 12/06/2012   Type 2 diabetes mellitus ([place]) 12/06/2012   Depression 07/13/2012   Anxiety 07/13/2012    REFERRING DIAG: I63.9 (ICD-10-CM) - Cerebral infarction, unspecified   THERAPY DIAG:  Hemiplegia and hemiparesis following cerebral infarction affecting right dominant side (HCC)  Other abnormalities of gait and mobility  Muscle weakness  (generalized)  Unsteadiness on feet  Other lack of coordination  Rationale for Evaluation and Treatment Rehabilitation  PERTINENT HISTORY: Presented to [facility] long emergency department on 11/15/2021 with slurred speech and confusion.  CT head and CTA of the head performed without acute findings or LVO. MRI found left basal ganglia infarct.  Admitted to rehab 11/22/2021 until 12/06/2021.   PMH:  Suicidal overdose, HTN, DM2, Acute CVA, depression, Huntington's like disease, hyperlipidemia, anxiety  PRECAUTIONS: Fall  SUBJECTIVE:  Pt denies falls and states she has been walking backwards and using the green band with her exercises.  Pt accompanied by: family member-daughter  PAIN:  Are you having pain? No   OBJECTIVE: (objective measures completed at initial evaluation unless otherwise dated)   TODAY'S TREATMENT:  GAIT: Gait pattern: step through pattern, decreased stride length, and narrow BOS Distance walked: 460' Assistive device utilized: Environmental consultant - 2 wheeled Level of assistance: SBA and CGA Comments: Pt demonstrates inc UE reliance on RW resulting in difficulty maintaining contact with floor when adjusting RW and turning.  Pt lifts and places RW.  Cued for motor planning to promote improved management of RW and approximation to AD as pt fatigues and shortens stride and increased distance between RW and body.  [facility] navigation for RW approximation and turning radius, spatial awareness and motor planning.  Pt requires mod multimodal cuing to improve navigation especially with narrowed spaces.  Countertop alt lunges 2x6 each LE  RAMP:  Level of Assistance: SBA Assistive device utilized: Walker - 2 wheeled Ramp Comments: Pt states she feels she is "going fast" on decline.  Min cues for quad engagement during descent and RW management over curb lip onto ramp and turning 360 degrees on platform.  When repeated x3 pt requires distant supervision and no cuing.  Pt stands on incline no UE  support EO 2x30sec > EC 2x30sec  4" step taps no UE support CGA alt LE x16 prior to fatigue, provided prolonged seated rest due to noted quad fatigue.  PATIENT EDUCATION: Education details:  Continue HEP w/ family supervision. Person educated: Patient and Daughter and Daughter's fiance. Education method: Explanation Education comprehension: verbalized understanding and needs further education     HOME EXERCISE PROGRAM: Access Code: [email or web address]    GOALS: Goals reviewed with patient? Yes   SHORT TERM GOALS: Target date: 01/24/2022   Pt will perform initial strength and balance HEP with supervision from family. Baseline:   To be established. Goal status: INITIAL   2.  Pt will decrease 5xSTS to </= 20 seconds w/ UE support in order to demonstrate decreased risk for falls and improved functional bilateral LE strength and power. Baseline: 24.00 sec w/ BUE Goal status: INITIAL   3.  Pt will demonstrate TUG of </=13 seconds safely in order to decrease risk of falls and improve functional mobility using LRAD. Baseline: 15.60 sec w/ RW, impulsive Goal status: INITIAL   4.  Pt will demonstrate a gait speed of >/=2.0 feet/sec w/ LRAD in order to decrease risk for falls. Baseline: 1.73 ft/sec w/ RW Goal status: INITIAL   5.  Pt will increase BERG balance score to >/=40/56 to demonstrate improved static balance. Baseline: 34/56 Goal status: INITIAL   LONG TERM GOALS: Target date: 02/21/2022   Pt will be compliant to structured walking program at least 3 days per week with supervision from sons or husband to promote maintained aerobic gains. Baseline: To be established. Goal status: INITIAL   2.  Pt will decrease 5xSTS to </=16 seconds w/ or w/o UE support in order to demonstrate decreased risk for falls and improved functional bilateral LE strength and power. Baseline: 24.00 sec w/ BUE support Goal status: INITIAL   3.  Pt will demonstrate a gait speed of >/=2.3 feet/sec w/ LRAD in  order to decrease risk for falls. Baseline: 1.73 ft/sec w/ RW Goal status: INITIAL   4.  Pt will ambulate >/= 500 feet on level surfaces with LRAD and supervision level of assist to promote household and community access. Baseline: 115' SBA/CGA w/ RW Goal status: INITIAL   5.  Pt will increase FOTO score to 71% in order to demonstrate subjective functional improvement. Baseline: 58% Goal status: INITIAL   6.  Pt will increase BERG balance score to >/= 45/56 to demonstrate improved static balance. Baseline: 34/56 Goal status: INITIAL   ASSESSMENT:   CLINICAL IMPRESSION: Focus of skilled session on addressing RW management and spatial awareness.  Pt continues to pick RW up to navigate with some noted left drift during ambulation.  She demonstrates carryover of instruction throughout session with increased awareness of safety with AD use on flat and incline surfaces.  Will continue to address deficits outlined in ongoing POC.   OBJECTIVE IMPAIRMENTS Abnormal gait, decreased activity tolerance, decreased balance, decreased cognition, decreased coordination, decreased endurance, decreased knowledge of condition, decreased knowledge of use of DME, decreased mobility, difficulty walking, decreased strength, decreased safety awareness, impaired UE functional use, improper  body mechanics, and postural dysfunction.    ACTIVITY LIMITATIONS carrying, lifting, bending, sitting, standing, squatting, stairs, transfers, bed mobility, bathing, dressing, self feeding, hygiene/grooming, and locomotion level   PARTICIPATION LIMITATIONS: meal prep, cleaning, laundry, medication management, personal finances, interpersonal relationship, driving, shopping, community activity, and occupation   PERSONAL FACTORS Age, Behavior pattern, Fitness, Past/current experiences, Time since onset of injury/illness/exacerbation, Transportation, and 3+ comorbidities: HTN, DM2, acute CVA, depression, Huntington's like disease  for ~7 years  are also affecting patient's functional outcome.    REHAB POTENTIAL: [place] personal factors and PMH   CLINICAL DECISION MAKING: Evolving/moderate complexity   EVALUATION COMPLEXITY: Moderate   PLAN: PT FREQUENCY: 2x/week   PT DURATION: 8 weeks   PLANNED INTERVENTIONS: Therapeutic exercises, Therapeutic activity, Neuromuscular re-education, Balance training, Gait training, Patient/Family education, Self Care, Stair training, Vestibular training, DME instructions, Manual therapy, and Re-evaluation   PLAN FOR NEXT SESSION: Modify HEP for posture in standing/LE strength/dynamic balance, gait training w/ RW, SciFit for endurance/strengthening, hip/quad strength, coordination tasks, RW management w/ turns/inclines/obstacles/approximation to AD and contact with ground, static balance w/o UE support, hurdles, step ups, tilt board   Bary Richard, PT, DPT 01/10/2022, 10:19 AM

## 2022-01-10 NOTE — Therapy (Signed)
OUTPATIENT SPEECH LANGUAGE PATHOLOGY TREATMENT NOTE   Patient Name: Hayley Bryant MRN: [id number] DOB:14-Apr-1971, 51 y.o., female Today's Date: 01/10/2022  PCP: Barrie Lyme  REFERRING PROVIDER: Cathlyn Parsons, PA-C   END OF SESSION:   End of Session - 01/10/22 0843     Visit Number 4    Number of Visits 17    Date for SLP Re-Evaluation 02/14/22    Authorization Type [employer]    SLP Start Time 0845    SLP Stop Time  0930    SLP Time Calculation (min) 45 min    Activity Tolerance Patient tolerated treatment well               Past Medical History:  Diagnosis Date   Anxiety    Depression    Diabetes mellitus    Fibroids    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[place])    Hypertension    Stroke [facility])    Past Surgical History:  Procedure Laterality Date   CHOLECYSTECTOMY     LYMPH NODE DISSECTION     Patient Active Problem List   Diagnosis Date Noted   Acute ischemic stroke ([place]) 11/22/2021   Acute CVA (cerebrovascular accident) ([place]) 11/15/2021   Abnormal LFTs 11/15/2021   Hyperlipidemia associated with type 2 diabetes mellitus ([place]) 09/07/2017   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   Uncontrolled type 2 diabetes mellitus with hyperglycemia ([place]) 05/07/2017   Overweight (BMI 25.0-29.9) 09/20/2013   Difficulty sleeping 09/20/2013   Vitamin D deficiency 06/21/2013   Suicidal overdose ([place]) 12/06/2012   HTN (hypertension) 12/06/2012   Type 2 diabetes mellitus ([place]) 12/06/2012   Depression 07/13/2012   Anxiety 07/13/2012    ONSET DATE:  12/04/2021  REFERRING DIAG: I63.9 (ICD-10-CM) - Cerebral infarction, unspecified   THERAPY DIAG:  Dysarthria and anarthria  Aphasia  Verbal apraxia  Cognitive communication deficit  Rationale for Evaluation and Treatment Rehabilitation  SUBJECTIVE: "I read the stories twice"  PAIN:  Are you having pain? No  OBJECTIVE:  TODAY'S TREATMENT:  01-10-22: Completed HEP to read paragraphs aloud to practice dysarthria  strategies (big, slow, and loud). Pt was approximately ~90-95% intelligible in initial 10 minute conversation with good carryover of trained dysarthria strategies. Educated and trained memory strategies today, as pt endorsed some reduced recall. Usual prompting and rephrasing required to ID functional memory deficits due to inconsistent responses possibly related to recall or confabulation? No family present to confirm today. Generated possible strategy for patient to trial pill box to aid more independent medication administration and requesting family to write down information to aid recall as needed. Pt endorsed desire to use pill box and phone calendar; however, overt fine motor difficulty exhibited indicating possible reduced insight into deficits. Reduced problem solving also noted, in which pt attempted to abandon tasks if initial difficulty exhibited (opening bottom tabs of pill box; stuck on specific phone screen). Reviewed anomia strategies, as pt exhibited difficulty with description in demo of mental picture strategy. Pt exhibited dysnomia (ex: crack in driveway, instead of garage door) but self-identified wrong word with extra time. SLP utilized moderate prompting to utilize description strategy to aid correction, in which pt able to ID targeted word with first letter prompt after describing.   01-07-22: Pt reported practicing dysarthria strategies for HEP and recalled 2/3 targeted strategies independently. Targeted use of dysarthria strategies (loud, slow rate, and over-articulation) at paragraph level today. Reduced awareness and recognition of decline in volume, increased rate, and reduced articulatory precision exhibited today. SLP engaged family  members in building patient awareness to target slowing rate and increasing loudness. Trialed varied visual aids to slow rate (tapping each word was most effective but inconsistent - benefited from SLP assistance to focus on each and every word). Loudness  improved from mid 60s to low 70s dB during first 1-2 sentences and decayed as paragraph progressed. Verbal cues increased decaying volume to upper 60s dB. Oral motor fatigue reported during structured practice. Recommended patient practice reading aloud with focus on strategies at least 10 minutes/twice a day to build patient endurance. Also re-educated use of description strategy to aid word retrieval at home with family prompting patient to describe or providing descriptions to aid anomia. All verbalized understanding and agreement with SLP recommendations.   01-03-22: Pt accompanied by eldest son today. Son endorsed her voice has not been as faint or weak in last several days. 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 Pt is experiencing ongoing dysnomia and anomia. SLP educated and instructed dysarthria compensations (slow rate, over-articulation, and volume), in which pt able to demonstrate at word and sentence level with usual fading to occasional min A. Demonstrated improving awareness of reduced volume and increased rate during structured tasks. Educated anomia compensations and targeted use of description strategy today via Chartered loss adjuster. Pt benefited from usual fading to occasional min prompting to verbalize particular components. SLP educated son on cuing hierarchy and allowing patient to have additional processing time, especially when word finding occurs.      PATIENT EDUCATION: Education details: see above Person educated: Patient and Child(ren) Education method: Customer service manager Education comprehension: verbalized understanding, returned demonstration, and needs further education     GOALS: Goals reviewed with patient? Yes   SHORT TERM GOALS: Target date: 01/16/2022     Pt will ID and attempt to correct speech errors (apraxic errors/dysnomia) with 80% accuracy on structured tasks given occasional mod A over sessions   Baseline: 01-10-22 Goal status: ongoing   2.  Pt will implement dysarthria compensations to be 90+% intelligible in 5-10 minute conversations given occasional mod A over 2 sessions Baseline: 01-10-22 Goal status: ongoing   3.   Pt will utilize word retrieval strategies on structured speech tasks with 80% accuracy given occasional mod A over 2 sessions  Baseline: 01-07-22 Goal status: ongoing   4.  Pt will use cognitive compensations to aid recall of functional information given occasional mod A over 2 sessions  Baseline:  Goal status: ongoing   5.  Family will provide appropriate cues to support cognitive communication skills as demonstrated in [place] sessions given occasional min A over 2 sessions Baseline: 01-07-22 Goal status: ongoing       LONG TERM GOALS: Target date: 02/14/2022    Pt will ID and attempt to correct speech errors (apraxic errors/dysnomia) with 90% accuracy in conversation given occasional min A over 2 sessions  Baseline:  Goal status: ongoing   2.  Pt will use trained dysarthria compensations to be 95+% intelligible in 15+ minute conversations given occasional min A over 2 sessions Baseline:  Goal status: ongoing   3.  Pt will utilize word retrieval strategies to optimize ability to functionally communicate in personally relevant conversations given occasional min A over 2 sessions Baseline:  Goal status: ongoing   4.  Pt will use cognitive compensations to aid recall of functional information given occasional min A over 2 sessions Baseline:  Goal status: ongoing   5.  Pt will report improved communication  effectiveness via PROM by 2 points at last ST session  Baseline: CPIB=15 Goal status: ongoing     ASSESSMENT:   CLINICAL IMPRESSION: Patient is a 51 y.o. female who was seen today for CVA in June 2023. PMHX significant for anxiety, depression, and Huntington like disease. Prior difficulty with talking, memory, and walking reported. Endorsed recent  communication changes including "stumbling" with words and difficulty with word retrieval. Pt presents with improving speech intelligibility with use of trained dysarthria strategies given intermittent min to mod cues. Occasional semantic paraphasias and use of vague language noted, which improved with cued use of extra time and use of description strategy. Conducted ongoing education and instruction of aphasia, dysarthria, and memory compensations, in which pt able to demonstrate with usual min to mod A on structured speech tasks today. Pt would benefit from skilled ST intervention to address speech, language, and cognitive changes to optimize communication effectiveness and functional independence.    OBJECTIVE IMPAIRMENTS include memory, aphasia, apraxia, and dysarthria. These impairments are limiting patient from household responsibilities and effectively communicating at home and in community. Factors affecting potential to achieve goals and functional outcome are medical prognosis and previous level of function. Patient will benefit from skilled SLP services to address above impairments and improve overall function.   REHAB POTENTIAL: Good   PLAN: SLP FREQUENCY: 2x/week   SLP DURATION: 8 weeks   PLANNED INTERVENTIONS: Language facilitation, Environmental controls, Cueing hierachy, Cognitive reorganization, Internal/external aids, Functional tasks, Multimodal communication approach, SLP instruction and feedback, Compensatory    Marzetta Board, CCC-SLP 01/10/2022, 8:44 AM

## 2022-01-10 NOTE — Patient Instructions (Signed)

## 2022-01-13 ENCOUNTER — Encounter: Payer: Self-pay | Admitting: Physical Therapy

## 2022-01-13 ENCOUNTER — Ambulatory Visit: Payer: Managed Care, Other (non HMO) | Admitting: Speech Pathology

## 2022-01-13 ENCOUNTER — Encounter: Payer: Self-pay | Admitting: Speech Pathology

## 2022-01-13 ENCOUNTER — Ambulatory Visit: Payer: Managed Care, Other (non HMO) | Admitting: Physical Therapy

## 2022-01-13 DIAGNOSIS — M6281 Muscle weakness (generalized): Secondary | ICD-10-CM

## 2022-01-13 DIAGNOSIS — I69351 Hemiplegia and hemiparesis following cerebral infarction affecting right dominant side: Secondary | ICD-10-CM | POA: Diagnosis not present

## 2022-01-13 DIAGNOSIS — R2681 Unsteadiness on feet: Secondary | ICD-10-CM

## 2022-01-13 DIAGNOSIS — R41841 Cognitive communication deficit: Secondary | ICD-10-CM

## 2022-01-13 DIAGNOSIS — R4701 Aphasia: Secondary | ICD-10-CM

## 2022-01-13 DIAGNOSIS — R482 Apraxia: Secondary | ICD-10-CM

## 2022-01-13 DIAGNOSIS — R278 Other lack of coordination: Secondary | ICD-10-CM

## 2022-01-13 DIAGNOSIS — R2689 Other abnormalities of gait and mobility: Secondary | ICD-10-CM

## 2022-01-13 NOTE — Patient Instructions (Signed)
  We are working toward getting Elisse more independent in managing and remembering her meds  Look on [employer] for an easy open pill organizer that she can use (with supervision)  Family will need to help her sort the pills into the organizer.   Bring in her memory notebook next session - she says she is having family write in it to help her remember   If she asks the same question or is not remembering something important, use the note book and cue her to look in her notebook to find the information

## 2022-01-13 NOTE — Therapy (Signed)
OUTPATIENT SPEECH LANGUAGE PATHOLOGY TREATMENT NOTE   Patient Name: Hayley Bryant MRN: [id number] DOB:[birth year], 51 y.o., female Today's Date: 01/13/2022  PCP: Barrie Lyme  REFERRING PROVIDER: Cathlyn Parsons, PA-C   END OF SESSION:   End of Session - 01/13/22 1019     Visit Number 5    Number of Visits 17    Date for SLP Re-Evaluation 02/14/22    Authorization Type [employer]    SLP Start Time 70    SLP Stop Time  1100    SLP Time Calculation (min) 40 min    Activity Tolerance Patient tolerated treatment well               Past Medical History:  Diagnosis Date   Anxiety    Depression    Diabetes mellitus    Fibroids    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[place])    Hypertension    Stroke [facility])    Past Surgical History:  Procedure Laterality Date   CHOLECYSTECTOMY     LYMPH NODE DISSECTION     Patient Active Problem List   Diagnosis Date Noted   Acute ischemic stroke ([place]) 11/22/2021   Acute CVA (cerebrovascular accident) ([place]) 11/15/2021   Abnormal LFTs 11/15/2021   Hyperlipidemia associated with type 2 diabetes mellitus ([place]) 09/07/2017   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   Uncontrolled type 2 diabetes mellitus with hyperglycemia ([place]) 05/07/2017   Overweight (BMI 25.0-29.9) 09/20/2013   Difficulty sleeping 09/20/2013   Vitamin D deficiency 06/21/2013   Suicidal overdose ([place]) 12/06/2012   HTN (hypertension) 12/06/2012   Type 2 diabetes mellitus ([place]) 12/06/2012   Depression 07/13/2012   Anxiety 07/13/2012    ONSET DATE:  12/04/2021  REFERRING DIAG: I63.9 (ICD-10-CM) - Cerebral infarction, unspecified   THERAPY DIAG:  Aphasia  Verbal apraxia  Cognitive communication deficit  Rationale for Evaluation and Treatment Rehabilitation  SUBJECTIVE: "I read the stories twice"  PAIN:  Are you having pain? No  OBJECTIVE:  TODAY'S TREATMENT:   01-13-22: HEP for dysarthria completed with rare min A for slow rate and over articulation, to be 95%  intelligible in complex paragraph reading. Targeted verbal compensations for aphasia in structured task, Hayley Bryant generated 8 salient descriptions with usual mod A. She has not implemented a pill box yet, sent note home to obtain an easy to open pill box. She endorses writing in a notebook to help her memory. When asked if she can write, she said no, her family writes for her and she is not sure she can find her notebook, indicating reduced problem solving and awareness. Written instructions to bring in her memory notebook. In conversation, Hayley Bryant maintained 95% intelligibility with rare min A  01-10-22: Completed HEP to read paragraphs aloud to practice dysarthria strategies (big, slow, and loud). Pt was approximately ~90-95% intelligible in initial 10 minute conversation with good carryover of trained dysarthria strategies. Educated and trained memory strategies today, as pt endorsed some reduced recall. Usual prompting and rephrasing required to ID functional memory deficits due to inconsistent responses possibly related to recall or confabulation? No family present to confirm today. Generated possible strategy for patient to trial pill box to aid more independent medication administration and requesting family to write down information to aid recall as needed. Pt endorsed desire to use pill box and phone calendar; however, overt fine motor difficulty exhibited indicating possible reduced insight into deficits. Reduced problem solving also noted, in which pt attempted to abandon tasks if initial difficulty exhibited (opening bottom tabs  of pill box; stuck on specific phone screen). Reviewed anomia strategies, as pt exhibited difficulty with description in demo of mental picture strategy. Pt exhibited dysnomia (ex: crack in driveway, instead of garage door) but self-identified wrong word with extra time. SLP utilized moderate prompting to utilize description strategy to aid correction, in which pt able to ID  targeted word with first letter prompt after describing.   01-07-22: Pt reported practicing dysarthria strategies for HEP and recalled 2/3 targeted strategies independently. Targeted use of dysarthria strategies (loud, slow rate, and over-articulation) at paragraph level today. Reduced awareness and recognition of decline in volume, increased rate, and reduced articulatory precision exhibited today. SLP engaged family members in building patient awareness to target slowing rate and increasing loudness. Trialed varied visual aids to slow rate (tapping each word was most effective but inconsistent - benefited from SLP assistance to focus on each and every word). Loudness improved from mid 60s to low 70s dB during first 1-2 sentences and decayed as paragraph progressed. Verbal cues increased decaying volume to upper 60s dB. Oral motor fatigue reported during structured practice. Recommended patient practice reading aloud with focus on strategies at least 10 minutes/twice a day to build patient endurance. Also re-educated use of description strategy to aid word retrieval at home with family prompting patient to describe or providing descriptions to aid anomia. All verbalized understanding and agreement with SLP recommendations.   01-03-22: Pt accompanied by eldest son today. Son endorsed her voice has not been as faint or weak in last several days. 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 Pt is experiencing ongoing dysnomia and anomia. SLP educated and instructed dysarthria compensations (slow rate, over-articulation, and volume), in which pt able to demonstrate at word and sentence level with usual fading to occasional min A. Demonstrated improving awareness of reduced volume and increased rate during structured tasks. Educated anomia compensations and targeted use of description strategy today via Chartered loss adjuster. Pt benefited from usual fading to occasional min  prompting to verbalize particular components. SLP educated son on cuing hierarchy and allowing patient to have additional processing time, especially when word finding occurs.      PATIENT EDUCATION: Education details: see above Person educated: Patient and Child(ren) Education method: Customer service manager Education comprehension: verbalized understanding, returned demonstration, and needs further education     GOALS: Goals reviewed with patient? Yes   SHORT TERM GOALS: Target date: 01/16/2022     Pt will ID and attempt to correct speech errors (apraxic errors/dysnomia) with 80% accuracy on structured tasks given occasional mod A over sessions  Baseline: 01-10-22 Goal status: ongoing   2.  Pt will implement dysarthria compensations to be 90+% intelligible in 5-10 minute conversations given occasional mod A over 2 sessions Baseline: 01-10-22 Goal status: ongoing   3.   Pt will utilize word retrieval strategies on structured speech tasks with 80% accuracy given occasional mod A over 2 sessions  Baseline: 01-07-22 Goal status: ongoing   4.  Pt will use cognitive compensations to aid recall of functional information given occasional mod A over 2 sessions  Baseline:  Goal status: ongoing   5.  Family will provide appropriate cues to support cognitive communication skills as demonstrated in [place] sessions given occasional min A over 2 sessions Baseline: 01-07-22 Goal status: ongoing       LONG TERM GOALS: Target date: 02/14/2022    Pt will ID and attempt to correct speech errors (apraxic errors/dysnomia) with  90% accuracy in conversation given occasional min A over 2 sessions  Baseline:  Goal status: ongoing   2.  Pt will use trained dysarthria compensations to be 95+% intelligible in 15+ minute conversations given occasional min A over 2 sessions Baseline:  Goal status: ongoing   3.  Pt will utilize word retrieval strategies to optimize ability to functionally communicate in  personally relevant conversations given occasional min A over 2 sessions Baseline:  Goal status: ongoing   4.  Pt will use cognitive compensations to aid recall of functional information given occasional min A over 2 sessions Baseline:  Goal status: ongoing   5.  Pt will report improved communication effectiveness via PROM by 2 points at last ST session  Baseline: CPIB=15 Goal status: ongoing     ASSESSMENT:   CLINICAL IMPRESSION: Patient is a 51 y.o. female who was seen today for CVA in June 2023. PMHX significant for anxiety, depression, and Huntington like disease. Prior difficulty with talking, memory, and walking reported. Endorsed recent communication changes including "stumbling" with words and difficulty with word retrieval. Pt presents with improving speech intelligibility with use of trained dysarthria strategies given intermittent min to mod cues. Occasional semantic paraphasias and use of vague language noted, which improved with cued use of extra time and use of description strategy. Conducted ongoing education and instruction of aphasia, dysarthria, and memory compensations, in which pt able to demonstrate with usual min to mod A on structured speech tasks today. Pt would benefit from skilled ST intervention to address speech, language, and cognitive changes to optimize communication effectiveness and functional independence.    OBJECTIVE IMPAIRMENTS include memory, aphasia, apraxia, and dysarthria. These impairments are limiting patient from household responsibilities and effectively communicating at home and in community. Factors affecting potential to achieve goals and functional outcome are medical prognosis and previous level of function. Patient will benefit from skilled SLP services to address above impairments and improve overall function.   REHAB POTENTIAL: Good   PLAN: SLP FREQUENCY: 2x/week   SLP DURATION: 8 weeks   PLANNED INTERVENTIONS: Language facilitation,  Environmental controls, Cueing hierachy, Cognitive reorganization, Internal/external aids, Functional tasks, Multimodal communication approach, SLP instruction and feedback, Compensatory    Indalecio Malmstrom, Annye Rusk, CCC-SLP 01/13/2022, 12:01 PM

## 2022-01-13 NOTE — Therapy (Signed)
OUTPATIENT PHYSICAL THERAPY TREATMENT NOTE   Patient Name: Hayley Bryant MRN: [id number] DOB:18-Dec-1970, 51 y.o., female Today's Date: 01/13/2022  PCP: Barrie Lyme, FNP REFERRING PROVIDER: Cathlyn Parsons, PA-C   END OF SESSION:   PT End of Session - 01/13/22 5400     Visit Number 5    Number of Visits 17   16+eval   Date for PT Re-Evaluation 03/07/22   pushed out due to patient preference for same clinician and delay with scheduling.   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[place])    Hypertension    Stroke [facility])    Past Surgical History:  Procedure Laterality Date   CHOLECYSTECTOMY     LYMPH NODE DISSECTION     Patient Active Problem List   Diagnosis Date Noted   Acute ischemic stroke ([place]) 11/22/2021   Acute CVA (cerebrovascular accident) ([place]) 11/15/2021   Abnormal LFTs 11/15/2021   Hyperlipidemia associated with type 2 diabetes mellitus ([place]) 09/07/2017   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   Uncontrolled type 2 diabetes mellitus with hyperglycemia ([place]) 05/07/2017   Overweight (BMI 25.0-29.9) 09/20/2013   Difficulty sleeping 09/20/2013   Vitamin D deficiency 06/21/2013   Suicidal overdose ([place]) 12/06/2012   HTN (hypertension) 12/06/2012   Type 2 diabetes mellitus ([place]) 12/06/2012   Depression 07/13/2012   Anxiety 07/13/2012    REFERRING DIAG: I63.9 (ICD-10-CM) - Cerebral infarction, unspecified   THERAPY DIAG:  Hemiplegia and hemiparesis following cerebral infarction affecting right dominant side (HCC)  Other abnormalities of gait and mobility  Muscle weakness (generalized)  Unsteadiness on  feet  Other lack of coordination  Rationale for Evaluation and Treatment Rehabilitation  PERTINENT HISTORY: Presented to [facility] long emergency department on 11/15/2021 with slurred speech and confusion.  CT head and CTA of the head performed without acute findings or LVO. MRI found left basal ganglia infarct.  Admitted to rehab 11/22/2021 until 12/06/2021.   PMH:  Suicidal overdose, HTN, DM2, Acute CVA, depression, Huntington's like disease, hyperlipidemia, anxiety  PRECAUTIONS: Fall  SUBJECTIVE:  Pt states she only got out of bed for a few things this weekend, she went to her daughters' house for dinner last night.  She did not do her exercises this weekend.  She denies falls.  Pt accompanied by: family member-daughter  PAIN:  Are you having pain? No   OBJECTIVE: (objective measures completed at initial evaluation unless otherwise dated)   TODAY'S TREATMENT:  NMR: -standing in // bars:  on firm FT EO x30sec > FT EC x30 sec > alt. semi-tandem EO x30sec > repeated on airex w/ significant sway noted -laterally oriented tilt board with pt holding level > lateral taps > holding level w/ perturbations for ankle and hip strategy -anteriorly oriented tilt board holding level > forward and backward rocking; pt requires BUE support entire task d/t instability noted -4" step ups w/ progressively dec UE support 2x10 alt LE, cued for upright posture and sequencing to initiate task as well as anterior weight shift onto  and off step  GAIT: Gait pattern: step through pattern, decreased stride length, and narrow BOS Distance walked: 264' + 90' + 45' Assistive device utilized: Environmental consultant - 2 wheeled Level of assistance: SBA Comments: Pt verbalizes recall of RW management from session prior and demonstrates good management of RW on flat surface in clear pathway.  She initially corrects stride when cued for increased steps without ability to maintain throughout gait.  Ambulated pt to the restroom then to ST  at end of session due to fatigue and pt requires second person to manage doors and handbag for safety.  PATIENT EDUCATION: Education details:  Benefits of being up out of bed and doing exercises 4-5 days a week to prevent further weakness vs staying in bed. Person educated: Patient and Daughter and Daughter's fiance. Education method: Explanation Education comprehension: verbalized understanding and needs further education     HOME EXERCISE PROGRAM: Access Code: [email or web address]    GOALS: Goals reviewed with patient? Yes   SHORT TERM GOALS: Target date: 01/24/2022   Pt will perform initial strength and balance HEP with supervision from family. Baseline:   To be established. Goal status: INITIAL   2.  Pt will decrease 5xSTS to </= 20 seconds w/ UE support in order to demonstrate decreased risk for falls and improved functional bilateral LE strength and power. Baseline: 24.00 sec w/ BUE Goal status: INITIAL   3.  Pt will demonstrate TUG of </=13 seconds safely in order to decrease risk of falls and improve functional mobility using LRAD. Baseline: 15.60 sec w/ RW, impulsive Goal status: INITIAL   4.  Pt will demonstrate a gait speed of >/=2.0 feet/sec w/ LRAD in order to decrease risk for falls. Baseline: 1.73 ft/sec w/ RW Goal status: INITIAL   5.  Pt will increase BERG balance score to >/=40/56 to demonstrate improved static balance. Baseline: 34/56 Goal status: INITIAL   LONG TERM GOALS: Target date: 02/21/2022   Pt will be compliant to structured walking program at least 3 days per week with supervision from sons or husband to promote maintained aerobic gains. Baseline: To be established. Goal status: INITIAL   2.  Pt will decrease 5xSTS to </=16 seconds w/ or w/o UE support in order to demonstrate decreased risk for falls and improved functional bilateral LE strength and power. Baseline: 24.00 sec w/ BUE support Goal status: INITIAL   3.  Pt will demonstrate a gait speed of  >/=2.3 feet/sec w/ LRAD in order to decrease risk for falls. Baseline: 1.73 ft/sec w/ RW Goal status: INITIAL   4.  Pt will ambulate >/= 500 feet on level surfaces with LRAD and supervision level of assist to promote household and community access. Baseline: 115' SBA/CGA w/ RW Goal status: INITIAL   5.  Pt will increase FOTO score to 71% in order to demonstrate subjective functional improvement. Baseline: 58% Goal status: INITIAL   6.  Pt will increase BERG balance score to >/= 45/56 to demonstrate improved static balance. Baseline: 34/56 Goal status: INITIAL   ASSESSMENT:   CLINICAL IMPRESSION: Focus of skilled session today on further addressing static balance with compliant surfaces and visual limitations.  Pt demonstrates increased sway in these scenarios, but is progressing in use of weight shift to correct more adequately.  She is still challenged by posterior preference in weight shifting during more dynamic tasks.  Will continue to address impairments as outlined in ongoing PT POC.   OBJECTIVE IMPAIRMENTS Abnormal gait, decreased activity tolerance, decreased balance, decreased cognition,  decreased coordination, decreased endurance, decreased knowledge of condition, decreased knowledge of use of DME, decreased mobility, difficulty walking, decreased strength, decreased safety awareness, impaired UE functional use, improper body mechanics, and postural dysfunction.    ACTIVITY LIMITATIONS carrying, lifting, bending, sitting, standing, squatting, stairs, transfers, bed mobility, bathing, dressing, self feeding, hygiene/grooming, and locomotion level   PARTICIPATION LIMITATIONS: meal prep, cleaning, laundry, medication management, personal finances, interpersonal relationship, driving, shopping, community activity, and occupation   PERSONAL FACTORS Age, Behavior pattern, Fitness, Past/current experiences, Time since onset of injury/illness/exacerbation, Transportation, and 3+  comorbidities: HTN, DM2, acute CVA, depression, Huntington's like disease for ~7 years  are also affecting patient's functional outcome.    REHAB POTENTIAL: [place] personal factors and PMH   CLINICAL DECISION MAKING: Evolving/moderate complexity   EVALUATION COMPLEXITY: Moderate   PLAN: PT FREQUENCY: 2x/week   PT DURATION: 8 weeks   PLANNED INTERVENTIONS: Therapeutic exercises, Therapeutic activity, Neuromuscular re-education, Balance training, Gait training, Patient/Family education, Self Care, Stair training, Vestibular training, DME instructions, Manual therapy, and Re-evaluation   PLAN FOR NEXT SESSION: Modify HEP for posture in standing/LE strength/dynamic balance, gait training w/ RW, Sci[employer] for endurance/strengthening, hip/quad strength, coordination tasks, static balance w/o UE support-EC, head movements, hurdles, step ups w/o UE support >6", tilt board anteriorly oriented   Bary Richard, PT, DPT 01/13/2022, 10:22 AM

## 2022-01-14 ENCOUNTER — Other Ambulatory Visit: Payer: Self-pay | Admitting: Physician Assistant

## 2022-01-15 ENCOUNTER — Encounter: Payer: Self-pay | Admitting: Physical Therapy

## 2022-01-15 ENCOUNTER — Encounter: Payer: Self-pay | Admitting: Speech Pathology

## 2022-01-15 ENCOUNTER — Ambulatory Visit: Payer: Managed Care, Other (non HMO) | Admitting: Speech Pathology

## 2022-01-15 ENCOUNTER — Ambulatory Visit: Payer: Managed Care, Other (non HMO) | Admitting: Physical Therapy

## 2022-01-15 DIAGNOSIS — M6281 Muscle weakness (generalized): Secondary | ICD-10-CM

## 2022-01-15 DIAGNOSIS — R4701 Aphasia: Secondary | ICD-10-CM

## 2022-01-15 DIAGNOSIS — R2681 Unsteadiness on feet: Secondary | ICD-10-CM

## 2022-01-15 DIAGNOSIS — I69351 Hemiplegia and hemiparesis following cerebral infarction affecting right dominant side: Secondary | ICD-10-CM

## 2022-01-15 DIAGNOSIS — R2689 Other abnormalities of gait and mobility: Secondary | ICD-10-CM

## 2022-01-15 DIAGNOSIS — R41841 Cognitive communication deficit: Secondary | ICD-10-CM

## 2022-01-15 DIAGNOSIS — R278 Other lack of coordination: Secondary | ICD-10-CM

## 2022-01-15 DIAGNOSIS — R482 Apraxia: Secondary | ICD-10-CM

## 2022-01-15 NOTE — Therapy (Signed)
OUTPATIENT SPEECH LANGUAGE PATHOLOGY TREATMENT NOTE   Patient Name: Hayley Bryant MRN: [id number] DOB:Mar 28, 1971, 51 y.o., female Today's Date: 01/15/2022  PCP: Barrie Lyme  REFERRING PROVIDER: Cathlyn Parsons, PA-C   END OF SESSION:   End of Session - 01/15/22 0851     Visit Number 6    Number of Visits 17    Date for SLP Re-Evaluation 02/14/22    Authorization Type [employer]    SLP Start Time 0845    SLP Stop Time  0930    SLP Time Calculation (min) 45 min    Activity Tolerance Patient tolerated treatment well               Past Medical History:  Diagnosis Date   Anxiety    Depression    Diabetes mellitus    Fibroids    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[place])    Hypertension    Stroke [facility])    Past Surgical History:  Procedure Laterality Date   CHOLECYSTECTOMY     LYMPH NODE DISSECTION     Patient Active Problem List   Diagnosis Date Noted   Acute ischemic stroke ([place]) 11/22/2021   Acute CVA (cerebrovascular accident) ([place]) 11/15/2021   Abnormal LFTs 11/15/2021   Hyperlipidemia associated with type 2 diabetes mellitus ([place]) 09/07/2017   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   Uncontrolled type 2 diabetes mellitus with hyperglycemia ([place]) 05/07/2017   Overweight (BMI 25.0-29.9) 09/20/2013   Difficulty sleeping 09/20/2013   Vitamin D deficiency 06/21/2013   Suicidal overdose ([place]) 12/06/2012   HTN (hypertension) 12/06/2012   Type 2 diabetes mellitus ([place]) 12/06/2012   Depression 07/13/2012   Anxiety 07/13/2012    ONSET DATE:  12/04/2021  REFERRING DIAG: I63.9 (ICD-10-CM) - Cerebral infarction, unspecified   THERAPY DIAG:  Aphasia  Verbal apraxia  Cognitive communication deficit  Rationale for Evaluation and Treatment Rehabilitation  SUBJECTIVE: "I didn't read - I watched [place]"  PAIN:  Are you having pain? No  OBJECTIVE:  TODAY'S TREATMENT:   01-15-22: HEP not completed, Family not present again - will send note to request their  presence. Hayley Bryant endorsed that she gave her son the note about the easy open pill box. She reports she is watching TV most of the day. Educated her to start some easy household chores that she can physically do to improve cognition. HEP for dysarthria with occasional min A for volume and breath support and slow rate. She required 3 verbal cues to ID episodes of reduced intelligibility. In structured tasks generating sentences with tri-syllabic words- She generated 10 sentences with 5 cues for volume and reduced rate. Occasional mod A to ID and self correct errors.  01-13-22: HEP for dysarthria completed with rare min A for slow rate and over articulation, to be 95% intelligible in complex paragraph reading. Targeted verbal compensations for aphasia in structured task, Hayley Bryant generated 8 salient descriptions with usual mod A. She has not implemented a pill box yet, sent note home to obtain an easy to open pill box. She endorses writing in a notebook to help her memory. When asked if she can write, she said no, her family writes for her and she is not sure she can find her notebook, indicating reduced problem solving and awareness. Written instructions to bring in her memory notebook. In conversation, Hayley Bryant maintained 95% intelligibility with rare min A  01-10-22: Completed HEP to read paragraphs aloud to practice dysarthria strategies (big, slow, and loud). Pt was approximately ~90-95% intelligible in initial 10  minute conversation with good carryover of trained dysarthria strategies. Educated and trained memory strategies today, as pt endorsed some reduced recall. Usual prompting and rephrasing required to ID functional memory deficits due to inconsistent responses possibly related to recall or confabulation? No family present to confirm today. Generated possible strategy for patient to trial pill box to aid more independent medication administration and requesting family to write down information to aid  recall as needed. Pt endorsed desire to use pill box and phone calendar; however, overt fine motor difficulty exhibited indicating possible reduced insight into deficits. Reduced problem solving also noted, in which pt attempted to abandon tasks if initial difficulty exhibited (opening bottom tabs of pill box; stuck on specific phone screen). Reviewed anomia strategies, as pt exhibited difficulty with description in demo of mental picture strategy. Pt exhibited dysnomia (ex: crack in driveway, instead of garage door) but self-identified wrong word with extra time. SLP utilized moderate prompting to utilize description strategy to aid correction, in which pt able to ID targeted word with first letter prompt after describing.   01-07-22: Pt reported practicing dysarthria strategies for HEP and recalled 2/3 targeted strategies independently. Targeted use of dysarthria strategies (loud, slow rate, and over-articulation) at paragraph level today. Reduced awareness and recognition of decline in volume, increased rate, and reduced articulatory precision exhibited today. SLP engaged family members in building patient awareness to target slowing rate and increasing loudness. Trialed varied visual aids to slow rate (tapping each word was most effective but inconsistent - benefited from SLP assistance to focus on each and every word). Loudness improved from mid 60s to low 70s dB during first 1-2 sentences and decayed as paragraph progressed. Verbal cues increased decaying volume to upper 60s dB. Oral motor fatigue reported during structured practice. Recommended patient practice reading aloud with focus on strategies at least 10 minutes/twice a day to build patient endurance. Also re-educated use of description strategy to aid word retrieval at home with family prompting patient to describe or providing descriptions to aid anomia. All verbalized understanding and agreement with SLP recommendations.   01-03-22: Pt accompanied  by eldest son today. Son endorsed her voice has not been as faint or weak in last several days. 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 Pt is experiencing ongoing dysnomia and anomia. SLP educated and instructed dysarthria compensations (slow rate, over-articulation, and volume), in which pt able to demonstrate at word and sentence level with usual fading to occasional min A. Demonstrated improving awareness of reduced volume and increased rate during structured tasks. Educated anomia compensations and targeted use of description strategy today via Chartered loss adjuster. Pt benefited from usual fading to occasional min prompting to verbalize particular components. SLP educated son on cuing hierarchy and allowing patient to have additional processing time, especially when word finding occurs.      PATIENT EDUCATION: Education details: see above Person educated: Patient and Child(ren) Education method: Customer service manager Education comprehension: verbalized understanding, returned demonstration, and needs further education     GOALS: Goals reviewed with patient? Yes   SHORT TERM GOALS: Target date: 01/16/2022     Pt will ID and attempt to correct speech errors (apraxic errors/dysnomia) with 80% accuracy on structured tasks given occasional mod A over sessions  Baseline: 01-10-22 Goal status: Achieved   2.  Pt will implement dysarthria compensations to be 90+% intelligible in 5-10 minute conversations given occasional mod A over 2 sessions Baseline: 01-10-22 Goal status: Achieved  3.   Pt will utilize word retrieval strategies on structured speech tasks with 80% accuracy given occasional mod A over 2 sessions  Baseline: 01-07-22 Goal status: Achieved   4.  Pt will use cognitive compensations to aid recall of functional information given occasional mod A over 2 sessions  Baseline:  Goal status: Not ,met   5.  Family will provide  appropriate cues to support cognitive communication skills as demonstrated in [place] sessions given occasional min A over 2 sessions Baseline: 01-07-22 Goal status: Not met       LONG TERM GOALS: Target date: 02/14/2022    Pt will ID and attempt to correct speech errors (apraxic errors/dysnomia) with 90% accuracy in conversation given occasional min A over 2 sessions  Baseline:  Goal status: ongoing   2.  Pt will use trained dysarthria compensations to be 95+% intelligible in 15+ minute conversations given occasional min A over 2 sessions Baseline:  Goal status: ongoing   3.  Pt will utilize word retrieval strategies to optimize ability to functionally communicate in personally relevant conversations given occasional min A over 2 sessions Baseline:  Goal status: ongoing   4.  Pt will use cognitive compensations to aid recall of functional information given occasional min A over 2 sessions Baseline:  Goal status: ongoing   5.  Pt will report improved communication effectiveness via PROM by 2 points at last ST session  Baseline: CPIB=15 Goal status: ongoing     ASSESSMENT:   CLINICAL IMPRESSION: Patient is a 51 y.o. female who was seen today for CVA in June 2023. PMHX significant for anxiety, depression, and Huntington like disease.Overall improved intelligibility to 90-95% in conversation in quiet environment. Word finding has improved as well, and in structured tasks she can use verbal compensations for word finding - ongoing training for carryover of compensations in conversation. Memory and attention continue to be moderately impaired. Family needs to attend some sessions to [facility] [place] in carryover of HEP and strategies to support her cognition and intelligibility. Pt would benefit from skilled ST intervention to address speech, language, and cognitive changes to optimize communication effectiveness and functional independence.    OBJECTIVE IMPAIRMENTS include memory, aphasia, apraxia,  and dysarthria. These impairments are limiting patient from household responsibilities and effectively communicating at home and in community. Factors affecting potential to achieve goals and functional outcome are medical prognosis and previous level of function. Patient will benefit from skilled SLP services to address above impairments and improve overall function.   REHAB POTENTIAL: Good   PLAN: SLP FREQUENCY: 2x/week   SLP DURATION: 8 weeks   PLANNED INTERVENTIONS: Language facilitation, Environmental controls, Cueing hierachy, Cognitive reorganization, Internal/external aids, Functional tasks, Multimodal communication approach, SLP instruction and feedback, Compensatory    Hayley Bryant, Annye Rusk, CCC-SLP 01/15/2022, 10:34 AM

## 2022-01-15 NOTE — Therapy (Signed)
OUTPATIENT PHYSICAL THERAPY TREATMENT NOTE   Patient Name: Hayley Bryant MRN: [id number] DOB:02-25-1971, 51 y.o., female Today's Date: 01/15/2022  PCP: Barrie Lyme, FNP REFERRING PROVIDER: Cathlyn Parsons, PA-C   END OF SESSION:   PT End of Session - 01/15/22 0935     Visit Number 6    Number of Visits 17   16+eval   Date for PT Re-Evaluation 03/07/22   pushed out due to patient preference for same clinician and delay with scheduling.   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[place])    Hypertension    Stroke [facility])    Past Surgical History:  Procedure Laterality Date   CHOLECYSTECTOMY     LYMPH NODE DISSECTION     Patient Active Problem List   Diagnosis Date Noted   Acute ischemic stroke ([place]) 11/22/2021   Acute CVA (cerebrovascular accident) ([place]) 11/15/2021   Abnormal LFTs 11/15/2021   Hyperlipidemia associated with type 2 diabetes mellitus ([place]) 09/07/2017   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   Uncontrolled type 2 diabetes mellitus with hyperglycemia ([place]) 05/07/2017   Overweight (BMI 25.0-29.9) 09/20/2013   Difficulty sleeping 09/20/2013   Vitamin D deficiency 06/21/2013   Suicidal overdose ([place]) 12/06/2012   HTN (hypertension) 12/06/2012   Type 2 diabetes mellitus ([place]) 12/06/2012   Depression 07/13/2012   Anxiety 07/13/2012    REFERRING DIAG: I63.9 (ICD-10-CM) - Cerebral infarction, unspecified   THERAPY DIAG:  Hemiplegia and hemiparesis following cerebral infarction affecting right dominant side (HCC)  Other abnormalities of gait and mobility  Muscle weakness  (generalized)  Unsteadiness on feet  Other lack of coordination  Rationale for Evaluation and Treatment Rehabilitation  PERTINENT HISTORY: Presented to [facility] long emergency department on 11/15/2021 with slurred speech and confusion.  CT head and CTA of the head performed without acute findings or LVO. MRI found left basal ganglia infarct.  Admitted to rehab 11/22/2021 until 12/06/2021.   PMH:  Suicidal overdose, HTN, DM2, Acute CVA, depression, Huntington's like disease, hyperlipidemia, anxiety  PRECAUTIONS: Fall  SUBJECTIVE:  Pt denies falls or other acute changes.  Pt accompanied by: self  PAIN:  Are you having pain? No   OBJECTIVE: (objective measures completed at initial evaluation unless otherwise dated)   TODAY'S TREATMENT:  NMR: -In // bars:  4" hurdles fwd w/ BUE progressed to RUE support > 4" hurdles laterally w/ BUE support and cuing to inc step size and decreased pelvis rotation to right when stepping to the right -4" step downs in // bars w/ BUE support, pt cued to improve foot placement for safety -Backwards ambulation progressed from BUE support to none w/ min cues for step width for safety 6x8' -Tandem walking w/ BUE support 4x8' -Ladder walking w/ CGA and hand held assist for improved step size and dynamic balance, min cues for corrected foot alignment following limb advancement  THEREX: -SciFit in hill mode L5 x8 mins using BUE/BLE for reciprocal movement, aerobic endurance, and inc amplitude of movement on  RLE with cuing required.  60 steps/min goal, achieved 703 Total steps.  PATIENT EDUCATION: Education details:  Continue HEP and walk with family as able using RW. Person educated: Patient and Daughter and Daughter's fiance. Education method: Explanation Education comprehension: verbalized understanding and needs further education     HOME EXERCISE PROGRAM: Access Code: [email or web address]    GOALS: Goals reviewed with patient? Yes   SHORT TERM GOALS: Target  date: 01/24/2022   Pt will perform initial strength and balance HEP with supervision from family. Baseline:   To be established. Goal status: INITIAL   2.  Pt will decrease 5xSTS to </= 20 seconds w/ UE support in order to demonstrate decreased risk for falls and improved functional bilateral LE strength and power. Baseline: 24.00 sec w/ BUE Goal status: INITIAL   3.  Pt will demonstrate TUG of </=13 seconds safely in order to decrease risk of falls and improve functional mobility using LRAD. Baseline: 15.60 sec w/ RW, impulsive Goal status: INITIAL   4.  Pt will demonstrate a gait speed of >/=2.0 feet/sec w/ LRAD in order to decrease risk for falls. Baseline: 1.73 ft/sec w/ RW Goal status: INITIAL   5.  Pt will increase BERG balance score to >/=40/56 to demonstrate improved static balance. Baseline: 34/56 Goal status: INITIAL   LONG TERM GOALS: Target date: 02/21/2022   Pt will be compliant to structured walking program at least 3 days per week with supervision from sons or husband to promote maintained aerobic gains. Baseline: To be established. Goal status: INITIAL   2.  Pt will decrease 5xSTS to </=16 seconds w/ or w/o UE support in order to demonstrate decreased risk for falls and improved functional bilateral LE strength and power. Baseline: 24.00 sec w/ BUE support Goal status: INITIAL   3.  Pt will demonstrate a gait speed of >/=2.3 feet/sec w/ LRAD in order to decrease risk for falls. Baseline: 1.73 ft/sec w/ RW Goal status: INITIAL   4.  Pt will ambulate >/= 500 feet on level surfaces with LRAD and supervision level of assist to promote household and community access. Baseline: 115' SBA/CGA w/ RW Goal status: INITIAL   5.  Pt will increase FOTO score to 71% in order to demonstrate subjective functional improvement. Baseline: 58% Goal status: INITIAL   6.  Pt will increase BERG balance score to >/= 45/56 to demonstrate improved static balance. Baseline: 34/56 Goal  status: INITIAL   ASSESSMENT:   CLINICAL IMPRESSION: Pt tolerates progression of dynamic balance to no UE support well this session w/ some inc need for guarding when performing ladder activity due to noted fear of falling.  Session focused on addressing motor planning and mechanics of stepping over flat ground and raised obstacles.  Pt continuing to make progress towards LTGs and is anticipated to continue to benefit from ongoing skilled PT POC.   OBJECTIVE IMPAIRMENTS Abnormal gait, decreased activity tolerance, decreased balance, decreased cognition, decreased coordination, decreased endurance, decreased knowledge of condition, decreased knowledge of use of DME, decreased mobility, difficulty walking, decreased strength, decreased safety awareness, impaired UE functional use, improper body mechanics, and postural dysfunction.    ACTIVITY LIMITATIONS carrying, lifting, bending, sitting, standing, squatting, stairs, transfers, bed mobility, bathing, dressing, self feeding, hygiene/grooming, and locomotion level   PARTICIPATION LIMITATIONS: meal prep, cleaning, laundry, medication management, personal finances, interpersonal relationship, driving, shopping, community activity, and occupation   PERSONAL FACTORS Age, Behavior pattern, Fitness, Past/current experiences, Time since onset of injury/illness/exacerbation, Transportation, and 3+ comorbidities: HTN, DM2,  acute CVA, depression, Huntington's like disease for ~7 years  are also affecting patient's functional outcome.    REHAB POTENTIAL: [place] personal factors and PMH   CLINICAL DECISION MAKING: Evolving/moderate complexity   EVALUATION COMPLEXITY: Moderate   PLAN: PT FREQUENCY: 2x/week   PT DURATION: 8 weeks   PLANNED INTERVENTIONS: Therapeutic exercises, Therapeutic activity, Neuromuscular re-education, Balance training, Gait training, Patient/Family education, Self Care, Stair training, Vestibular training, DME instructions,  Manual therapy, and Re-evaluation   PLAN FOR NEXT SESSION: Modify HEP for posture in standing/LE strength/dynamic balance, gait training w/ RW, SciFit for endurance/strengthening, hip/quad strength, coordination tasks, step ups w/o UE support >6", tilt board anteriorly oriented, tall hurdles, dot drills, obstacle course   Bary Richard, PT, DPT 01/15/2022, 10:29 AM

## 2022-01-15 NOTE — Patient Instructions (Addendum)
   Family needs to attend next PT and ST sessions please  Our goal is to help Hayley Bryant be as independent as possible. She would benefit from completing easy household tasks, checking a calendar to keep up with her appointments and using a pill box to manage her medications.   Due to her cognitive impairments, we need family to attend therapy to help her carryover her strategies for walking and completing her home exercises properly

## 2022-01-21 ENCOUNTER — Encounter: Payer: Self-pay | Admitting: Neurology

## 2022-01-21 ENCOUNTER — Ambulatory Visit (INDEPENDENT_AMBULATORY_CARE_PROVIDER_SITE_OTHER): Payer: Managed Care, Other (non HMO) | Admitting: Neurology

## 2022-01-21 VITALS — BP 166/98 | HR 69 | Ht 67.0 in | Wt 152.0 lb

## 2022-01-21 DIAGNOSIS — I6381 Other cerebral infarction due to occlusion or stenosis of small artery: Secondary | ICD-10-CM | POA: Diagnosis not present

## 2022-01-21 MED ORDER — ATORVASTATIN CALCIUM 40 MG PO TABS: 40.0000 mg | ORAL_TABLET | Freq: Every day | ORAL | 4 refills | Status: AC

## 2022-01-21 MED ORDER — TRAZODONE HCL 50 MG PO TABS: 25.0000 mg | ORAL_TABLET | Freq: Every evening | ORAL | 4 refills | Status: AC | PRN

## 2022-01-21 NOTE — Progress Notes (Signed)
[facility]    Provider:  Dr Jaynee Eagles Requesting Provider: Barrie Lyme, FNP Primary Care Provider:  System, Provider Not In  CC:  stroke  HPI:  Hayley Bryant is a 51 y.o. female history of Anxiety, depression, DM, HTN,  insomnia, suicidal overdose, vitamin D deficiency, and Huntington's disease who presented tot he ED for acute onset of slurred speech and confusion. NIHSS 3. here as requested by Barrie Lyme, FNP for lacunar stroke due to uncontrolled vascular risk factors including HTN, HLD, uncontrolled diabetes.  Patient here alone. She had acute right-sided weakness and difficulty speaking. And a droop on the face. No numbness or tingling. Went to the ED. Feeling better, feels speech is better, strength is better, in PT/OT at neuro rehab. She stopped the Plavix for the DUAP therapy and is on ASA right now. She has been following up with pcp for management of vascular risk factors. No problems with vision. Speech is getting clearer, no swallowing problems. Taking the asa daily. Normally her BP at home is in the 120 today is elevated but that is nor normal so won't adjust BP meds, may be white-coat syndrome. Working with primary to get diabetes under control. LDL was 78 and increased lipitor. Now on [facility] for diabetes. No other focal neurologic deficits, associated symptoms, inciting events or modifiable factors.  Reviewed notes, labs and imaging from outside physicians, which showed :  Hgba1c 8.4. LDL 78  (Additional 30 minutes not included in this office visit personally reviewing imaging results below and reviewing chart)  6/06/07/2021: CT head IMPRESSION: 1. No acute intracranial process. 2. ASPECTS is 10  CTA H&N: EXAM: CT ANGIOGRAPHY HEAD AND NECK   TECHNIQUE: Multidetector CT imaging of the head and neck was performed using the standard protocol during bolus administration of intravenous contrast. Multiplanar CT image reconstructions and MIPs  were obtained to evaluate the vascular anatomy. Carotid stenosis measurements (when applicable) are obtained utilizing NASCET criteria, using the distal internal carotid diameter as the denominator.   RADIATION DOSE REDUCTION: This exam was performed according to the departmental dose-optimization program which includes automated exposure control, adjustment of the mA and/or kV according to patient size and/or use of iterative reconstruction technique.   CONTRAST:  22m OMNIPAQUE IOHEXOL 350 MG/ML SOLN   COMPARISON:  None Available.   FINDINGS: CTA NECK   Aortic arch: Minimal calcified plaque. Great vessel origins are patent.   Right carotid system: Patent. Mild mixed plaque along the proximal ICA with less than 50% stenosis.   Left carotid system: Patent. Mild mixed plaque along the proximal ICA with less than 50% stenosis.   Vertebral arteries: Patent and codominant.  No stenosis.   Skeleton: No acute or significant osseous abnormality.   Other neck: Unremarkable.   Upper chest: 3 mm nodule of the left upper lobe.   Review of the MIP images confirms the above findings   CTA HEAD   Anterior circulation: Intracranial internal carotid arteries are patent. Anterior and middle cerebral arteries are patent.   Posterior circulation: Intracranial vertebral arteries are patent. Basilar artery is patent. Bilateral posterior communicating arteries are present. Posterior cerebral arteries are patent.   Venous sinuses: Patent as allowed by contrast bolus timing.   Review of the MIP images confirms the above findings   IMPRESSION: No large vessel occlusion, hemodynamically significant stenosis, or evidence of dissection.   MRI : FINDINGS: Brain: Small area of restricted diffusion extending from the left lentiform nucleus superiorly to the corona radiata  and caudate body. No hemorrhage. Patchy T2 hyperintensity in the supratentorial white matter is nonspecific but may  reflect mild chronic microvascular ischemic changes. Prominence of the ventricles and sulci reflects parenchymal volume loss. These findings have progressed since 2018. No intracranial mass or mass effect. There is no hydrocephalus or extra-axial fluid collection. No abnormal enhancement.   Vascular: Major vessel flow voids at the skull base are preserved.   Skull and upper cervical spine: Normal marrow signal is preserved.   Sinuses/Orbits: Paranasal sinuses are aerated. Orbits are unremarkable.   Other: Sella is unremarkable.  Mastoid air cells are clear.   IMPRESSION: Acute small vessel infarct involving the left basal ganglia and adjacent white matter.   Mild chronic microvascular ischemic changes.  Review of Systems: Patient complains of symptoms per HPI as well as the following symptoms stroke. Pertinent negatives and positives per HPI. All others negative.   Social History   Socioeconomic History   Marital status: Married    Spouse name: Not on file   Number of children: 3   Years of education: 16   Highest education level: Not on file  Occupational History   Occupation: [employer]  Tobacco Use   Smoking status: Never   Smokeless tobacco: Never  Vaping Use   Vaping Use: Never used  Substance and Sexual Activity   Alcohol use: No   Drug use: No   Sexual activity: Yes    Partners: Male    Birth control/protection: None  Other Topics Concern   Not on file  Social History Narrative   Lives at home w/ her husband and children   Right-handed   Caffeine: drinks coffee or soda when working nights   Social Determinants of Health   Financial Resource Strain: Not on file  Food Insecurity: Not on file  Transportation Needs: Not on file  Physical Activity: Not on file  Stress: Not on file  Social Connections: Not on file  Intimate Partner Violence: Not on file    Family History  Problem Relation Age of Onset   Hypertension Mother    Heart attack Mother     Huntington's disease Mother    Huntington's disease Maternal Grandmother    Stroke Neg Hx     Past Medical History:  Diagnosis Date   Anxiety    Depression    Diabetes mellitus    Fibroids    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[place])    Hypertension    Stroke [facility])     Patient Active Problem List   Diagnosis Date Noted   Acute ischemic stroke ([place]) 11/22/2021   Acute CVA (cerebrovascular accident) ([place]) 11/15/2021   Abnormal LFTs 11/15/2021   Hyperlipidemia associated with type 2 diabetes mellitus ([place]) 09/07/2017   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[place]) 05/12/2017   Uncontrolled type 2 diabetes mellitus with hyperglycemia ([place]) 05/07/2017   Overweight (BMI 25.0-29.9) 09/20/2013   Difficulty sleeping 09/20/2013   Vitamin D deficiency 06/21/2013   Suicidal overdose ([place]) 12/06/2012   HTN (hypertension) 12/06/2012   Type 2 diabetes mellitus ([place]) 12/06/2012   Depression 07/13/2012   Anxiety 07/13/2012    Past Surgical History:  Procedure Laterality Date   CHOLECYSTECTOMY     LYMPH NODE DISSECTION      Current Outpatient Medications  Medication Sig Dispense Refill   acetaminophen (TYLENOL) 325 MG tablet Take 1-2 tablets (325-650 mg total) by mouth every 4 (four) hours as needed for mild pain.     aspirin EC 81 MG tablet Take 1 tablet (81 mg  total) by mouth daily. Swallow whole. 30 tablet 0   atorvastatin (LIPITOR) 40 MG tablet Take 1 tablet (40 mg total) by mouth at bedtime. 90 tablet 4   blood glucose meter kit and supplies KIT Dispense based on patient and insurance preference. Use up to four times daily as directed. 1 each 0   diclofenac Sodium (VOLTAREN) 1 % GEL Apply 2 g topically 4 (four) times daily. 50 g 0   FLUoxetine (PROZAC) 20 MG capsule Take 3 capsules (60 mg total) by mouth daily. 30 capsule 0   Insulin Glargine (BASAGLAR KWIKPEN) 100 UNIT/ML Inject 15 Units into the skin daily. 3 mL 0   lisinopril-hydrochlorothiazide (ZESTORETIC) 20-25 MG tablet Take 1 tablet by mouth daily.  30 tablet 0   Tirzepatide (MOUNJARO [place]) Inject into the skin.     traZODone (DESYREL) 50 MG tablet Take 0.5-1 tablets (25-50 mg total) by mouth at bedtime as needed for sleep. 90 tablet 4   pantoprazole (PROTONIX) 40 MG tablet Take 1 tablet (40 mg total) by mouth daily for 14 days. 14 tablet 0   potassium chloride SA (KLOR-CON M) 20 MEQ tablet Take 1 tablet (20 mEq total) by mouth 2 (two) times daily for 3 days. 6 tablet 0   sucralfate (CARAFATE) 1 g tablet Take 1 tablet (1 g total) by mouth 4 (four) times daily -  with meals and at bedtime for 7 days. 28 tablet 0   No current facility-administered medications for this visit.    Allergies as of 01/21/2022 - Review Complete 01/21/2022  Allergen Reaction Noted   Metformin and related Other (See Comments) 02/25/2015    Vitals: BP (!) 166/98   Pulse 69   Ht _0  (1.702 m)   Wt 152 lb (68.9 kg)   LMP 12/23/2021 (Approximate)   BMI 23.81 kg/m  Last Weight:  Wt Readings from Last 1 Encounters:  01/21/22 152 lb (68.9 kg)   Last Height:   Ht Readings from Last 1 Encounters:  01/21/22 _1  (1.702 m)     Physical exam: Exam: Gen: NAD, conversant, well nourised, well groomed                     CV: RRR, no MRG. No Carotid Bruits. No peripheral edema, warm, nontender Eyes: Conjunctivae clear without exudates or hemorrhage  Neuro: Detailed Neurologic Exam  Speech:    Speech is dysarthric Cognition:    The patient is oriented to person, place, and time;     recent and remote memory intact;     language fluent;     normal attention, concentration,     fund of knowledge Cranial Nerves:    The pupils are equal, round, and reactive to light. Attempted, pupils too small to visualize fundi. Extraocular movements are intact. Trigeminal sensation is intact and the muscles of mastication are normal. Mild right lower facial weakness. The palate elevates in the midline. Hearing intact. Voice is normal. Shoulder shrug is normal. The tongue  has normal motion without fasciculations.   Coordination:    Normal finger to nose and heel to shin.   Gait:    Stable with walker  Motor Observation:    No asymmetry, no atrophy, and no involuntary movements noted. Tone:    Normal muscle tone.    Posture:    Posture is normal. normal erect    Strength:    Very mild right hemiparesis otherwise left 5/5 (improved from Dr. Phoebe Sharps last exam)  Sensation: intact to LT     Reflex Exam:  DTR's:    Deep tendon reflexes in the upper and lower extremities are normal bilaterally.   Toes:    The toes are equiv bilaterally.  Query right toe slightly upgoing Clonus:    Clonus is absent.    Assessment/Plan:  Hayley Bryant is a 51 y.o. female history of Anxiety, depression, DM, HTN,  insomnia, suicidal overdose, vitamin D deficiency, and Huntington's disease who presented tot he ED for acute onset of slurred speech and confusion. NIHSS 3. here as requested by Barrie Lyme, FNP for lacunar stroke due to uncontrolled vascular risk factors including HTN, HLD, uncontrolled diabetes.  - She already has a neurologist, she sees [facility] for huntington's disease and also has another neurologist for her stroke so she can follow up with her other neurologists, no follow up at [place]  - Hypercoag panel negative    Stroke:  Acute large left basal ganglia small infarct, likely secondary to small vessel disease given uncontrolled risk factors  -Patient with acute small vessel infarct involving the left basal ganglia and adjacent white matter with residual dysarthria, right-sided lower facial weakness and mild right hemiparesis. -CTA of the head and neck showed no large vessel occlusion, stenosis or dissection -TCD bubble study no PFO -2D echo did not show PFO or clot however did show left ventricular hypertrophy likely from uncontrolled hypertension and other findings, follow-up with primary care or cardiology -Hemoglobin A1c 7.7  follow-up with primary care goal less than 6.5 -LDL 78 increase Lipitor to 40 follow-up with primary care goal less than 70 -Hypercoag panel negative -Was on dual antiplatelet for 3 weeks now on aspirin 81, discussed continuation for life and compliance for secondary stroke prevention -Hypertension, uncontrolled, long-term blood pressure goal is normotensive, follow-up with primary care  Meds ordered this encounter  Medications   atorvastatin (LIPITOR) 40 MG tablet    Sig: Take 1 tablet (40 mg total) by mouth at bedtime.    Dispense:  90 tablet    Refill:  4   traZODone (DESYREL) 50 MG tablet    Sig: Take 0.5-1 tablets (25-50 mg total) by mouth at bedtime as needed for sleep.    Dispense:  90 tablet    Refill:  4    I had a long d/w patient about her recent stroke, risk for recurrent stroke/TIAs, personally independently reviewed imaging studies and stroke evaluation results and answered questions.Continue ASA for secondary stroke prevention and maintain strict control of hypertension with blood pressure goal below 130/90, diabetes with hemoglobin A1c goal below 6.5% and lipids with LDL cholesterol goal below 70 mg/dL. I also advised the patient to eat a healthy diet with plenty of whole grains, lean protein, fruits and vegetables, exercise regularly and maintain ideal body weight .Followup i with her other 2 neurologists.  I also encourage PT, OT and speech therapy.  Cc: Barrie Lyme, FNP,  System, Provider Not In  Sarina Ill, MD  [facility] [address] [place], [place] [address]  Phone [phone] Fax [phone]  I spent over 60 minutes of face-to-face and non-face-to-face time with patient on the  1. Lacunar infarction ([place])    diagnosis.  This included previsit chart review, lab review, study review, order entry, electronic health record documentation, patient education on the different diagnostic and therapeutic options, counseling and  coordination of care, risks and benefits of management, compliance, or risk factor reduction

## 2022-01-21 NOTE — Patient Instructions (Signed)
Continue current management F/u with primary care and other neurologists  Stroke Prevention Some medical conditions and behaviors can lead to a higher chance of having a stroke. You can help prevent a stroke by eating healthy, exercising, not smoking, and managing any medical conditions you have. Stroke is a leading cause of functional impairment. Primary prevention is particularly important because a majority of strokes are first-time events. Stroke changes the lives of not only those who experience a stroke but also their family and other caregivers. How can this condition affect me? A stroke is a medical emergency and should be treated right away. A stroke can lead to brain damage and can sometimes be life-threatening. If a person gets medical treatment right away, there is a better chance of surviving and recovering from a stroke. What can increase my risk? The following medical conditions may increase your risk of a stroke: Cardiovascular disease. High blood pressure (hypertension). Diabetes. High cholesterol. Sickle cell disease. Blood clotting disorders (hypercoagulable state). Obesity. Sleep disorders (obstructive sleep apnea). Other risk factors include: Being older than age 43. Having a history of blood clots, stroke, or mini-stroke (transient ischemic attack, TIA). Genetic factors, such as race, ethnicity, or a family history of stroke. Smoking cigarettes or using other tobacco products. Taking birth control pills, especially if you also use tobacco. Heavy use of alcohol or drugs, especially cocaine and methamphetamine. Physical inactivity. What actions can I take to prevent this? Manage your health conditions High cholesterol levels. Eating a healthy diet is important for preventing high cholesterol. If cholesterol cannot be managed through diet alone, you may need to take medicines. Take any prescribed medicines to control your cholesterol as told by your health care  provider. Hypertension. To reduce your risk of stroke, try to keep your blood pressure below 130/80. Eating a healthy diet and exercising regularly are important for controlling blood pressure. If these steps are not enough to manage your blood pressure, you may need to take medicines. Take any prescribed medicines to control hypertension as told by your health care provider. Ask your health care provider if you should monitor your blood pressure at home. Have your blood pressure checked every year, even if your blood pressure is normal. Blood pressure increases with age and some medical conditions. Diabetes. Eating a healthy diet and exercising regularly are important parts of managing your blood sugar (glucose). If your blood sugar cannot be managed through diet and exercise, you may need to take medicines. Take any prescribed medicines to control your diabetes as told by your health care provider. Get evaluated for obstructive sleep apnea. Talk to your health care provider about getting a sleep evaluation if you snore a lot or have excessive sleepiness. Make sure that any other medical conditions you have, such as atrial fibrillation or atherosclerosis, are managed. Nutrition Follow instructions from your health care provider about what to eat or drink to help manage your health condition. These instructions may include: Reducing your daily calorie intake. Limiting how much salt (sodium) you use to 1,500 milligrams (mg) each day. Using only healthy fats for cooking, such as olive oil, canola oil, or sunflower oil. Eating healthy foods. You can do this by: Choosing foods that are high in fiber, such as whole grains, and fresh fruits and vegetables. Eating at least 5 servings of fruits and vegetables a day. Try to fill one-half of your plate with fruits and vegetables at each meal. Choosing lean protein foods, such as lean cuts of meat, poultry without  skin, fish, tofu, beans, and nuts. Eating  low-fat dairy products. Avoiding foods that are high in sodium. This can help lower blood pressure. Avoiding foods that have saturated fat, trans fat, and cholesterol. This can help prevent high cholesterol. Avoiding processed and prepared foods. Counting your daily carbohydrate intake.  Lifestyle If you drink alcohol: Limit how much you have to: 0-1 drink a day for women who are not pregnant. 0-2 drinks a day for men. Know how much alcohol is in your drink. In the [place] one drink equals one 12 oz bottle of beer (351m), one 5 oz glass of wine (1471m, or one 1 oz glass of hard liquor (4432m Do not use any products that contain nicotine or tobacco. These products include cigarettes, chewing tobacco, and vaping devices, such as e-cigarettes. If you need help quitting, ask your health care provider. Avoid secondhand smoke. Do not use drugs. Activity  Try to stay at a healthy weight. Get at least 30 minutes of exercise on most days, such as: Fast walking. Biking. Swimming. Medicines Take over-the-counter and prescription medicines only as told by your health care provider. Aspirin or blood thinners (antiplatelets or anticoagulants) may be recommended to reduce your risk of forming blood clots that can lead to stroke. Avoid taking birth control pills. Talk to your health care provider about the risks of taking birth control pills if: You are over 35 57ars old. You smoke. You get very bad headaches. You have had a blood clot. Where to find more information American Stroke Association: www.strokeassociation.org Get help right away if: You or a loved one has any symptoms of a stroke. "BE FAST" is an easy way to remember the main warning signs of a stroke: B - Balance. Signs are dizziness, sudden trouble walking, or loss of balance. E - Eyes. Signs are trouble seeing or a sudden change in vision. F - Face. Signs are sudden weakness or numbness of the face, or the face or eyelid drooping  on one side. A - Arms. Signs are weakness or numbness in an arm. This happens suddenly and usually on one side of the body. S - Speech. Signs are sudden trouble speaking, slurred speech, or trouble understanding what people say. T - Time. Time to call emergency services. Write down what time symptoms started. You or a loved one has other signs of a stroke, such as: A sudden, severe headache with no known cause. Nausea or vomiting. Seizure. These symptoms may represent a serious problem that is an emergency. Do not wait to see if the symptoms will go away. Get medical help right away. Call your local emergency services (911 in the [place] Do not drive yourself to the hospital. Summary You can help to prevent a stroke by eating healthy, exercising, not smoking, limiting alcohol intake, and managing any medical conditions you may have. Do not use any products that contain nicotine or tobacco. These include cigarettes, chewing tobacco, and vaping devices, such as e-cigarettes. If you need help quitting, ask your health care provider. Remember "BE FAST" for warning signs of a stroke. Get help right away if you or a loved one has any of these signs. This information is not intended to replace advice given to you by your health care provider. Make sure you discuss any questions you have with your health care provider. Document Revised: 12/19/2019 Document Reviewed: 12/19/2019 Elsevier Patient Education  [address]

## 2022-01-22 ENCOUNTER — Ambulatory Visit: Payer: Managed Care, Other (non HMO) | Admitting: Physical Therapy

## 2022-01-22 ENCOUNTER — Ambulatory Visit: Payer: Managed Care, Other (non HMO) | Admitting: Speech Pathology

## 2022-01-22 ENCOUNTER — Encounter: Payer: Self-pay | Admitting: Speech Pathology

## 2022-01-22 ENCOUNTER — Encounter: Payer: Self-pay | Admitting: Physical Therapy

## 2022-01-22 DIAGNOSIS — R278 Other lack of coordination: Secondary | ICD-10-CM

## 2022-01-22 DIAGNOSIS — R2689 Other abnormalities of gait and mobility: Secondary | ICD-10-CM

## 2022-01-22 DIAGNOSIS — R471 Dysarthria and anarthria: Secondary | ICD-10-CM

## 2022-01-22 DIAGNOSIS — R2681 Unsteadiness on feet: Secondary | ICD-10-CM

## 2022-01-22 DIAGNOSIS — I69351 Hemiplegia and hemiparesis following cerebral infarction affecting right dominant side: Secondary | ICD-10-CM

## 2022-01-22 DIAGNOSIS — R41841 Cognitive communication deficit: Secondary | ICD-10-CM

## 2022-01-22 DIAGNOSIS — M6281 Muscle weakness (generalized): Secondary | ICD-10-CM

## 2022-01-22 NOTE — Patient Instructions (Signed)
   Use alarm or other aid to help [facility] recall her am meds on her own -   Cue her to look at her bill chart every time she asks about a bill so she gets in the habit of doing this  Include [place] in chores - she can fold clothes, unload silverware, fold towels  Checkers Chess Connect [place] saw puzzles Easy cross words Memory match Board games [place] a new game!  Listen to and discuss Ted Talks or Podcasts Read and discuss short articles of interest to you- Take notes on these if memory is a challenge Discuss social media posts Look and discuss photo albums  The best activities to improve cognition are functional, real life activities that are important to you:  Plan a menu Participate in household chores and decisions (with supervision) Participate in [place] a party, trip or tailgate with all of the details (even if you aren't really going to carry it out) Participate in your hobby as you are able with assistance Manage your texts, emails with supervision if needed. Google search for items (even if you're not really going to buy anything) and compare prices and features Socialize -  however, too many visitors can be overwhelming, so set limits "My doctor said I should only visit (or talk) for 20 minutes" or "I do better when I visit with just 1-2 people at a time for 20 minutes"    It's good to use real in-person games, not just apps  Apps:  [employer] Elevate There are apps for most of the games listed above

## 2022-01-22 NOTE — Therapy (Signed)
OUTPATIENT PHYSICAL THERAPY TREATMENT NOTE   Patient Name: Hayley Bryant MRN: [id number] DOB:06-24-1970, 51 y.o., female Today's Date: 01/22/2022  PCP: Barrie Lyme, FNP REFERRING PROVIDER: Cathlyn Parsons, PA-C   END OF SESSION:   PT End of Session - 01/22/22 3254     Visit Number 7    Number of Visits 17   16+eval   Date for PT Re-Evaluation 03/07/22   pushed out due to patient preference for same clinician and delay with scheduling.   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[place])    Hypertension    Stroke [facility])    Past Surgical History:  Procedure Laterality Date   CHOLECYSTECTOMY     LYMPH NODE DISSECTION     Patient Active Problem List   Diagnosis Date Noted   Acute ischemic stroke ([place]) 11/22/2021   Acute CVA (cerebrovascular accident) ([place]) 11/15/2021   Abnormal LFTs 11/15/2021   Hyperlipidemia associated with type 2 diabetes mellitus ([place]) 09/07/2017   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   Uncontrolled type 2 diabetes mellitus with hyperglycemia ([place]) 05/07/2017   Overweight (BMI 25.0-29.9) 09/20/2013   Difficulty sleeping 09/20/2013   Vitamin D deficiency 06/21/2013   Suicidal overdose ([place]) 12/06/2012   HTN (hypertension) 12/06/2012   Type 2 diabetes mellitus ([place]) 12/06/2012   Depression 07/13/2012   Anxiety 07/13/2012    REFERRING DIAG: I63.9 (ICD-10-CM) - Cerebral infarction, unspecified   THERAPY DIAG:  Hemiplegia and hemiparesis following cerebral infarction affecting right dominant side (HCC)  Other abnormalities of gait and mobility  Muscle weakness (generalized)  Unsteadiness on  feet  Other lack of coordination  Rationale for Evaluation and Treatment Rehabilitation  PERTINENT HISTORY: Presented to [facility] long emergency department on 11/15/2021 with slurred speech and confusion.  CT head and CTA of the head performed without acute findings or LVO. MRI found left basal ganglia infarct.  Admitted to rehab 11/22/2021 until 12/06/2021.   PMH:  Suicidal overdose, HTN, DM2, Acute CVA, depression, Huntington's like disease, hyperlipidemia, anxiety  PRECAUTIONS: Fall  SUBJECTIVE:  Pt states she fell trying to get in the bed the day she went to the doctor (unsure what day this was).  "I had 3 shots that day and my arm was numb.  I picked myself up and I was fine."  Pt accompanied by: self  PAIN:  Are you having pain? No   OBJECTIVE: (objective measures completed at initial evaluation unless otherwise dated)   TODAY'S TREATMENT:  NMR: -3.3lb ball taps to back of chair to facilitate reach ~2 feet outside BOS 2x10 -6" step taps intermittent CGA no UE support 2x15 each LE; when returning to sit pt requires modA and cues to prevent missing EOM as pt does not locate seating with hands or vision -star taps on blue mat w/ max multimodal cuing for improved coordination; regressed to 3-way taps w/ LLE in stance due to inc functional quad weakness and hesitancy of pt  THEREX: -STS w/ 5# kettlebell hold 2x8 -SciFit w/ BUE/BLE x10mns (  2 minutes at L2.0 > 63mns at L5.0)  Pt step goal of 85 w/ total steps achieved .  Min cues to promote large amplitude movement and power w/ inc force of LE push.  Performed for endurance and reciprocal mobility.  PATIENT EDUCATION: Education details:  Continue HEP and walk with family as able using RW. Person educated: Patient and Daughter and Daughter's fiance. Education method: Explanation Education comprehension: verbalized understanding and needs further education     HOME EXERCISE PROGRAM: Access Code: [email or web address]    GOALS: Goals reviewed  with patient? Yes   SHORT TERM GOALS: Target date: 01/24/2022   Pt will perform initial strength and balance HEP with supervision from family. Baseline:   To be established. Goal status: INITIAL   2.  Pt will decrease 5xSTS to </= 20 seconds w/ UE support in order to demonstrate decreased risk for falls and improved functional bilateral LE strength and power. Baseline: 24.00 sec w/ BUE Goal status: INITIAL   3.  Pt will demonstrate TUG of </=13 seconds safely in order to decrease risk of falls and improve functional mobility using LRAD. Baseline: 15.60 sec w/ RW, impulsive Goal status: INITIAL   4.  Pt will demonstrate a gait speed of >/=2.0 feet/sec w/ LRAD in order to decrease risk for falls. Baseline: 1.73 ft/sec w/ RW Goal status: INITIAL   5.  Pt will increase BERG balance score to >/=40/56 to demonstrate improved static balance. Baseline: 34/56 Goal status: INITIAL   LONG TERM GOALS: Target date: 02/21/2022   Pt will be compliant to structured walking program at least 3 days per week with supervision from sons or husband to promote maintained aerobic gains. Baseline: To be established. Goal status: INITIAL   2.  Pt will decrease 5xSTS to </=16 seconds w/ or w/o UE support in order to demonstrate decreased risk for falls and improved functional bilateral LE strength and power. Baseline: 24.00 sec w/ BUE support Goal status: INITIAL   3.  Pt will demonstrate a gait speed of >/=2.3 feet/sec w/ LRAD in order to decrease risk for falls. Baseline: 1.73 ft/sec w/ RW Goal status: INITIAL   4.  Pt will ambulate >/= 500 feet on level surfaces with LRAD and supervision level of assist to promote household and community access. Baseline: 115' SBA/CGA w/ RW Goal status: INITIAL   5.  Pt will increase FOTO score to 71% in order to demonstrate subjective functional improvement. Baseline: 58% Goal status: INITIAL   6.  Pt will increase BERG balance score to >/= 45/56 to demonstrate  improved static balance. Baseline: 34/56 Goal status: INITIAL   ASSESSMENT:   CLINICAL IMPRESSION: Focus of skilled session on addressing static balance without UE support and challenging stability outside normal BOS.  Continued to address coordination w/ pt demonstrating difficulty maintaining concentration on more challenging tasks.  She is progressing in her overall activity tolerance and stability.  STGs to be assessed next session to determine needed changes to ongoing skilled PT POC.   OBJECTIVE IMPAIRMENTS Abnormal gait, decreased activity tolerance, decreased balance, decreased cognition, decreased coordination, decreased endurance, decreased knowledge of condition, decreased knowledge of use of DME, decreased mobility, difficulty walking, decreased strength, decreased safety awareness, impaired UE functional use, improper body mechanics, and postural dysfunction.    ACTIVITY LIMITATIONS carrying, lifting, bending, sitting, standing, squatting, stairs, transfers, bed mobility, bathing, dressing, self feeding, hygiene/grooming, and locomotion level   PARTICIPATION LIMITATIONS: meal prep, cleaning, laundry, medication management, personal finances, interpersonal relationship, driving, shopping, community activity,  and occupation   PERSONAL FACTORS Age, Behavior pattern, Fitness, Past/current experiences, Time since onset of injury/illness/exacerbation, Transportation, and 3+ comorbidities: HTN, DM2, acute CVA, depression, Huntington's like disease for ~7 years  are also affecting patient's functional outcome.    REHAB POTENTIAL: Fair See personal factors and PMH   CLINICAL DECISION MAKING: Evolving/moderate complexity   EVALUATION COMPLEXITY: Moderate   PLAN: PT FREQUENCY: 2x/week   PT DURATION: 8 weeks   PLANNED INTERVENTIONS: Therapeutic exercises, Therapeutic activity, Neuromuscular re-education, Balance training, Gait training, Patient/Family education, Self Care, Stair  training, Vestibular training, DME instructions, Manual therapy, and Re-evaluation   PLAN FOR NEXT SESSION: Assess STGs!  Modify HEP for posture in standing/LE strength/dynamic balance, gait training w/ RW, SciFit for endurance/strengthening, hip/quad strength, coordination tasks, step ups w/o UE support >6", tilt board anteriorly oriented, tall hurdles, dot drills, obstacle course   Bary Richard, PT, DPT 01/22/2022, 10:17 AM

## 2022-01-22 NOTE — Therapy (Signed)
OUTPATIENT SPEECH LANGUAGE PATHOLOGY TREATMENT NOTE   Patient Name: Hayley Bryant MRN: [id number] DOB:18-Feb-1971, 51 y.o., female Today's Date: 01/22/2022  PCP: Barrie Lyme  REFERRING PROVIDER: Cathlyn Parsons, PA-C   END OF SESSION:   End of Session - 01/22/22 0847     Visit Number 7    Number of Visits 17    Date for SLP Re-Evaluation 02/14/22    Authorization Type [employer]    SLP Start Time 0845    SLP Stop Time  0930    SLP Time Calculation (min) 45 min    Activity Tolerance Patient tolerated treatment well               Past Medical History:  Diagnosis Date   Anxiety    Depression    Diabetes mellitus    Fibroids    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   Uncontrolled type 2 diabetes mellitus with hyperglycemia ([place]) 05/07/2017   Overweight (BMI 25.0-29.9) 09/20/2013   Difficulty sleeping 09/20/2013   Vitamin D deficiency 06/21/2013   Suicidal overdose ([place]) 12/06/2012   HTN (hypertension) 12/06/2012   Type 2 diabetes mellitus ([place]) 12/06/2012   Depression 07/13/2012   Anxiety 07/13/2012    ONSET DATE:  12/04/2021  REFERRING DIAG: I63.9 (ICD-10-CM) - Cerebral infarction, unspecified   THERAPY DIAG:  Dysarthria and anarthria  Cognitive communication deficit  Rationale for Evaluation and Treatment Rehabilitation  SUBJECTIVE: "She stays in her room most of the day" spouse [name] attended session  PAIN:  Are you having pain? No  OBJECTIVE:  TODAY'S TREATMENT:   01-22-22: Spouse, [name] present. Instructed him to get a pill  organizer and set timer for Malynda to take meds. He reports poor safety awareness at home, as Dorenda forgets her walker or uses the stairs without requesting help. Coleta stated she could use the stairs without help, but is supposed to ask for help, indicated reduced awareness. With mod A, Clifford did verbalize she needed to ask for help on the stairs. [name] also reported [name] on bills, calling him and her daughter repeatedly during the day to ask if a bill has been paid. Targeted recall of bills paid by generating a chart with 5 bills which [employer] ID'd and stated due dates with mod I. Family is to involve her in bill paying and she is to fill out the chart with the due date and date paid to aid in recall of bill payments and to reduce calls to family re: bills. Chart placed in page protector, instructed them to fill out each month with dry erase marker, post where Annais can see it and cue her to look at the chart when she asks if a bill has been paid. Provided another list of cognitive activities to do at home as spouse reports she is not doing these - educated him to encourage her to do them. She is with her daughter in law during the day - requested she also attend therapy.   01-15-22: HEP not completed, Family not present again -  will send note to request their presence. Sharne endorsed that she gave her son the note about the easy open pill box. She reports she is watching TV most of the day. Educated her to start some easy household chores that she can physically do to improve cognition. HEP for dysarthria with occasional min A for volume and breath support and slow rate. She required 3 verbal cues to ID episodes of reduced intelligibility. In structured tasks generating sentences with tri-syllabic words- She generated 10 sentences with 5 cues for volume and reduced rate. Occasional mod A to ID and self correct errors.  01-13-22: HEP for dysarthria completed with rare min A for slow  rate and over articulation, to be 95% intelligible in complex paragraph reading. Targeted verbal compensations for aphasia in structured task, Ross generated 8 salient descriptions with usual mod A. She has not implemented a pill box yet, sent note home to obtain an easy to open pill box. She endorses writing in a notebook to help her memory. When asked if she can write, she said no, her family writes for her and she is not sure she can find her notebook, indicating reduced problem solving and awareness. Written instructions to bring in her memory notebook. In conversation, Lenae maintained 95% intelligibility with rare min A  01-10-22: Completed HEP to read paragraphs aloud to practice dysarthria strategies (big, slow, and loud). Pt was approximately ~90-95% intelligible in initial 10 minute conversation with good carryover of trained dysarthria strategies. Educated and trained memory strategies today, as pt endorsed some reduced recall. Usual prompting and rephrasing required to ID functional memory deficits due to inconsistent responses possibly related to recall or confabulation? No family present to confirm today. Generated possible strategy for patient to trial pill box to aid more independent medication administration and requesting family to write down information to aid recall as needed. Pt endorsed desire to use pill box and phone calendar; however, overt fine motor difficulty exhibited indicating possible reduced insight into deficits. Reduced problem solving also noted, in which pt attempted to abandon tasks if initial difficulty exhibited (opening bottom tabs of pill box; stuck on specific phone screen). Reviewed anomia strategies, as pt exhibited difficulty with description in demo of mental picture strategy. Pt exhibited dysnomia (ex: crack in driveway, instead of garage door) but self-identified wrong word with extra time. SLP utilized moderate prompting to utilize description strategy to aid  correction, in which pt able to ID targeted word with first letter prompt after describing.   01-07-22: Pt reported practicing dysarthria strategies for HEP and recalled 2/3 targeted strategies independently. Targeted use of dysarthria strategies (loud, slow rate, and over-articulation) at paragraph level today. Reduced awareness and recognition of decline in volume, increased rate, and reduced articulatory precision exhibited today. SLP engaged family members in building patient awareness to target slowing rate and increasing loudness. Trialed varied visual aids to slow rate (tapping each word was most effective but inconsistent - benefited from SLP assistance to focus on each and every word). Loudness improved from mid 60s to low 70s dB during first 1-2 sentences and decayed as paragraph progressed. Verbal cues increased decaying volume to upper 60s dB. Oral motor fatigue reported during structured practice. Recommended patient practice reading aloud with focus on strategies at least 10 minutes/twice a day to build patient endurance. Also re-educated use of description strategy to aid word retrieval at home with family prompting patient to describe or providing descriptions to aid anomia. All verbalized understanding and agreement with SLP  recommendations.   01-03-22: Pt accompanied by eldest son today. Son endorsed her voice has not been as faint or weak in last several days. 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 Pt is experiencing ongoing dysnomia and anomia. SLP educated and instructed dysarthria compensations (slow rate, over-articulation, and volume), in which pt able to demonstrate at word and sentence level with usual fading to occasional min A. Demonstrated improving awareness of reduced volume and increased rate during structured tasks. Educated anomia compensations and targeted use of description strategy today via Chartered loss adjuster. Pt benefited from  usual fading to occasional min prompting to verbalize particular components. SLP educated son on cuing hierarchy and allowing patient to have additional processing time, especially when word finding occurs.      PATIENT EDUCATION: Education details: see above Person educated: Patient and Child(ren) Education method: Customer service manager Education comprehension: verbalized understanding, returned demonstration, and needs further education     GOALS: Goals reviewed with patient? Yes   SHORT TERM GOALS: Target date: 01/16/2022     Pt will ID and attempt to correct speech errors (apraxic errors/dysnomia) with 80% accuracy on structured tasks given occasional mod A over sessions  Baseline: 01-10-22 Goal status: Achieved   2.  Pt will implement dysarthria compensations to be 90+% intelligible in 5-10 minute conversations given occasional mod A over 2 sessions Baseline: 01-10-22 Goal status: Achieved   3.   Pt will utilize word retrieval strategies on structured speech tasks with 80% accuracy given occasional mod A over 2 sessions  Baseline: 01-07-22 Goal status: Achieved   4.  Pt will use cognitive compensations to aid recall of functional information given occasional mod A over 2 sessions  Baseline:  Goal status: Not ,met   5.  Family will provide appropriate cues to support cognitive communication skills as demonstrated in [place] sessions given occasional min A over 2 sessions Baseline: 01-07-22 Goal status: Not met       LONG TERM GOALS: Target date: 02/14/2022    Pt will ID and attempt to correct speech errors (apraxic errors/dysnomia) with 90% accuracy in conversation given occasional min A over 2 sessions  Baseline:  Goal status: ongoing   2.  Pt will use trained dysarthria compensations to be 95+% intelligible in 15+ minute conversations given occasional min A over 2 sessions Baseline:  Goal status: ongoing   3.  Pt will utilize word retrieval strategies to optimize  ability to functionally communicate in personally relevant conversations given occasional min A over 2 sessions Baseline:  Goal status: ongoing   4.  Pt will use cognitive compensations to aid recall of functional information given occasional min A over 2 sessions Baseline:  Goal status: ongoing   5.  Pt will report improved communication effectiveness via PROM by 2 points at last ST session  Baseline: CPIB=15 Goal status: ongoing     ASSESSMENT:   CLINICAL IMPRESSION: Patient is a 51 y.o. female who was seen today for CVA in June 2023. PMHX significant for anxiety, depression, and Huntington like disease.Overall improved intelligibility to 90-95% in conversation in quiet environment. Word finding has improved as well, and in structured tasks she can use verbal compensations for word finding - ongoing training for carryover of compensations in conversation. Memory and attention continue to be moderately impaired. Family needs to attend some sessions to [facility] [place] in carryover of HEP and strategies to support her cognition and intelligibility. Pt would benefit from skilled ST intervention  to address speech, language, and cognitive changes to optimize communication effectiveness and functional independence.    OBJECTIVE IMPAIRMENTS include memory, aphasia, apraxia, and dysarthria. These impairments are limiting patient from household responsibilities and effectively communicating at home and in community. Factors affecting potential to achieve goals and functional outcome are medical prognosis and previous level of function. Patient will benefit from skilled SLP services to address above impairments and improve overall function.   REHAB POTENTIAL: Good   PLAN: SLP FREQUENCY: 2x/week   SLP DURATION: 8 weeks   PLANNED INTERVENTIONS: Language facilitation, Environmental controls, Cueing hierachy, Cognitive reorganization, Internal/external aids, Functional tasks, Multimodal communication  approach, SLP instruction and feedback, Compensatory    Demon Volante, Annye Rusk, CCC-SLP 01/22/2022, 10:29 AM

## 2022-01-24 ENCOUNTER — Ambulatory Visit: Payer: Managed Care, Other (non HMO) | Admitting: Speech Pathology

## 2022-01-24 ENCOUNTER — Ambulatory Visit: Payer: Managed Care, Other (non HMO) | Admitting: Physical Therapy

## 2022-01-24 NOTE — Therapy (Deleted)
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[place])    Hypertension    Stroke [facility])    Past Surgical History:  Procedure Laterality Date   CHOLECYSTECTOMY     LYMPH NODE DISSECTION     Patient Active Problem List   Diagnosis Date Noted   Acute ischemic stroke ([place]) 11/22/2021   Acute CVA (cerebrovascular accident) ([place]) 11/15/2021   Abnormal LFTs 11/15/2021   Hyperlipidemia associated with type 2 diabetes mellitus ([place]) 09/07/2017   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[place]) 05/12/2017   Uncontrolled type 2 diabetes mellitus with hyperglycemia ([place]) 05/07/2017   Overweight (BMI 25.0-29.9) 09/20/2013   Difficulty sleeping 09/20/2013   Vitamin D deficiency 06/21/2013   Suicidal overdose ([place]) 12/06/2012   HTN (hypertension) 12/06/2012   Type 2 diabetes mellitus ([place]) 12/06/2012   Depression 07/13/2012   Anxiety 07/13/2012    ONSET DATE:  12/04/2021  REFERRING DIAG: I63.9 (ICD-10-CM) - Cerebral infarction, unspecified   THERAPY DIAG:  Dysarthria and anarthria  Cognitive communication deficit  Aphasia  Verbal apraxia  Rationale for Evaluation and Treatment Rehabilitation  SUBJECTIVE: ***  PAIN:  Are you having pain? No  OBJECTIVE:  TODAY'S TREATMENT:   01-24-22: ***  01-22-22: Spouse, [name] present. Instructed him to get a pill organizer and set timer for Kimberlie to take meds. He reports poor safety awareness at home, as [name] forgets her walker or uses the stairs without requesting help. Heydi stated she could use the stairs without help, but is supposed to ask for help, indicated reduced awareness. With mod A, Carmeline did verbalize she  needed to ask for help on the stairs. [name] also reported [name] on bills, calling him and her daughter repeatedly during the day to ask if a bill has been paid. Targeted recall of bills paid by generating a chart with 5 bills which [name] ID'd and stated due dates with mod I. Family is to involve her in bill paying and she is to fill out the chart with the due date and date paid to aid in recall of bill payments and to reduce calls to family re: bills. Chart placed in page protector, instructed them to fill out each month with dry erase marker, post where [name] can see it and cue her to look at the chart when she asks if a bill has been paid. Provided another list of cognitive activities to do at home as spouse reports she is not doing these - educated him to encourage her to do them. She is with her daughter in law during the day - requested she also attend therapy.    PATIENT EDUCATION: Education details: see above Person educated: Patient and Child(ren) Education method: Customer service manager Education comprehension: verbalized understanding, returned demonstration, and needs further education     GOALS: Goals reviewed with patient? Yes   SHORT TERM GOALS: Target date: 01/16/2022     Pt will ID and attempt to correct speech errors (apraxic errors/dysnomia) with 80% accuracy on structured tasks given occasional mod A over sessions  Baseline: 01-10-22 Goal status: Achieved   2.  Pt will implement dysarthria compensations to be 90+%  intelligible in 5-10 minute conversations given occasional mod A over 2 sessions Baseline: 01-10-22 Goal status: Achieved   3.   Pt will utilize word retrieval strategies on structured speech tasks with 80% accuracy given occasional mod A over 2 sessions  Baseline: 01-07-22 Goal status: Achieved   4.  Pt will use cognitive compensations to aid recall of functional information given occasional mod A over 2 sessions  Baseline:  Goal status:  Not ,met   5.  Family will provide appropriate cues to support cognitive communication skills as demonstrated in [place] sessions given occasional min A over 2 sessions Baseline: 01-07-22 Goal status: Not met       LONG TERM GOALS: Target date: 02/14/2022    Pt will ID and attempt to correct speech errors (apraxic errors/dysnomia) with 90% accuracy in conversation given occasional min A over 2 sessions  Baseline:  Goal status: ongoing   2.  Pt will use trained dysarthria compensations to be 95+% intelligible in 15+ minute conversations given occasional min A over 2 sessions Baseline:  Goal status: ongoing   3.  Pt will utilize word retrieval strategies to optimize ability to functionally communicate in personally relevant conversations given occasional min A over 2 sessions Baseline:  Goal status: ongoing   4.  Pt will use cognitive compensations to aid recall of functional information given occasional min A over 2 sessions Baseline:  Goal status: ongoing   5.  Pt will report improved communication effectiveness via PROM by 2 points at last ST session  Baseline: CPIB=15 Goal status: ongoing     ASSESSMENT:   CLINICAL IMPRESSION: Patient is a 51 y.o. female who was seen today for CVA in June 2023. PMHX significant for anxiety, depression, and Huntington like disease.Overall improved intelligibility to 90-95% in conversation in quiet environment. Word finding has improved as well, and in structured tasks she can use verbal compensations for word finding - ongoing training for carryover of compensations in conversation. Memory and attention continue to be moderately impaired. Family needs to attend some sessions to [facility] [name] in carryover of HEP and strategies to support her cognition and intelligibility. Pt would benefit from skilled ST intervention to address speech, language, and cognitive changes to optimize communication effectiveness and functional independence.    OBJECTIVE IMPAIRMENTS  include memory, aphasia, apraxia, and dysarthria. These impairments are limiting patient from household responsibilities and effectively communicating at home and in community. Factors affecting potential to achieve goals and functional outcome are medical prognosis and previous level of function. Patient will benefit from skilled SLP services to address above impairments and improve overall function.   REHAB POTENTIAL: Good   PLAN: SLP FREQUENCY: 2x/week   SLP DURATION: 8 weeks   PLANNED INTERVENTIONS: Language facilitation, Environmental controls, Cueing hierachy, Cognitive reorganization, Internal/external aids, Functional tasks, Multimodal communication approach, SLP instruction and feedback, Compensatory    Su Monks, CCC-SLP 01/24/2022, 7:55 AM

## 2022-01-27 ENCOUNTER — Ambulatory Visit: Payer: Managed Care, Other (non HMO) | Admitting: Occupational Therapy

## 2022-01-27 ENCOUNTER — Encounter: Payer: Self-pay | Admitting: Physical Therapy

## 2022-01-27 ENCOUNTER — Ambulatory Visit: Payer: Managed Care, Other (non HMO) | Admitting: Physical Therapy

## 2022-01-27 ENCOUNTER — Ambulatory Visit: Payer: Managed Care, Other (non HMO) | Admitting: Speech Pathology

## 2022-01-27 DIAGNOSIS — R2681 Unsteadiness on feet: Secondary | ICD-10-CM

## 2022-01-27 DIAGNOSIS — R482 Apraxia: Secondary | ICD-10-CM

## 2022-01-27 DIAGNOSIS — R4701 Aphasia: Secondary | ICD-10-CM

## 2022-01-27 DIAGNOSIS — M6281 Muscle weakness (generalized): Secondary | ICD-10-CM

## 2022-01-27 DIAGNOSIS — I69351 Hemiplegia and hemiparesis following cerebral infarction affecting right dominant side: Secondary | ICD-10-CM

## 2022-01-27 DIAGNOSIS — R41841 Cognitive communication deficit: Secondary | ICD-10-CM

## 2022-01-27 DIAGNOSIS — R2689 Other abnormalities of gait and mobility: Secondary | ICD-10-CM

## 2022-01-27 DIAGNOSIS — R278 Other lack of coordination: Secondary | ICD-10-CM

## 2022-01-27 NOTE — Therapy (Signed)
OUTPATIENT PHYSICAL THERAPY TREATMENT NOTE   Patient Name: KYARAH ENAMORADO MRN: [id number] DOB:1970-10-03, 51 y.o., female Today's Date: 01/27/2022  PCP: Barrie Lyme, FNP REFERRING PROVIDER: Cathlyn Parsons, PA-C   END OF SESSION:   PT End of Session - 01/27/22 1023     Visit Number 8    Number of Visits 17   16+eval   Date for PT Re-Evaluation 03/07/22   pushed out due to patient preference for same clinician and delay with scheduling.   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[place])    Hypertension    Stroke [facility])    Past Surgical History:  Procedure Laterality Date   CHOLECYSTECTOMY     LYMPH NODE DISSECTION     Patient Active Problem List   Diagnosis Date Noted   Acute ischemic stroke ([place]) 11/22/2021   Acute CVA (cerebrovascular accident) ([place]) 11/15/2021   Abnormal LFTs 11/15/2021   Hyperlipidemia associated with type 2 diabetes mellitus ([place]) 09/07/2017   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   Uncontrolled type 2 diabetes mellitus with hyperglycemia ([place]) 05/07/2017   Overweight (BMI 25.0-29.9) 09/20/2013   Difficulty sleeping 09/20/2013   Vitamin D deficiency 06/21/2013   Suicidal overdose ([place]) 12/06/2012   HTN (hypertension) 12/06/2012   Type 2 diabetes mellitus ([place]) 12/06/2012   Depression 07/13/2012   Anxiety 07/13/2012    REFERRING DIAG: I63.9 (ICD-10-CM) - Cerebral infarction, unspecified   THERAPY DIAG:  Hemiplegia and hemiparesis following cerebral infarction affecting right dominant side (HCC)  Other abnormalities of gait and mobility  Muscle weakness  (generalized)  Unsteadiness on feet  Other lack of coordination  Rationale for Evaluation and Treatment Rehabilitation  PERTINENT HISTORY: Presented to [facility] long emergency department on 11/15/2021 with slurred speech and confusion.  CT head and CTA of the head performed without acute findings or LVO. MRI found left basal ganglia infarct.  Admitted to rehab 11/22/2021 until 12/06/2021.   PMH:  Suicidal overdose, HTN, DM2, Acute CVA, depression, Huntington's like disease, hyperlipidemia, anxiety  PRECAUTIONS: Fall  SUBJECTIVE:  Pt states she has not fallen recently and has seemed to be fine since prior fall.  She had a hard time with speech therapy putting her on the spot today according to daughter.  Pt has been going downstairs without supervision or RW.  Daughter states they try to keep her from doing this.  Pt accompanied by: self  PAIN:  Are you having pain? No   OBJECTIVE: (objective measures completed at initial evaluation unless otherwise dated)   TODAY'S TREATMENT:  TA: -Verbally reviewed HEP.  Pt states these feel easy now, will advance at next session. -5xSTS:  18.68 sec w/ BUE support -TUG:  16.94 sec w/ RW, impulsivity somewhat improved w/ pt demonstrating better management of RW -10MWT:  12.93sec = 0.77 m/sec OR 2.55 ft/sec -Assessed BERG:   [facility] PT Assessment - 01/27/22 1045       Berg Balance Test   Sit to Stand Able to stand without  using hands and stabilize independently    Standing Unsupported Able to stand safely 2 minutes    Sitting with Back Unsupported but Feet Supported on Floor or Stool Able to sit safely and securely 2 minutes    Stand to Sit Sits safely with minimal use of hands    Transfers Able to transfer safely, definite need of hands    Standing Unsupported with Eyes Closed Able to stand 10 seconds safely    Standing Unsupported with Feet Together Able to place feet together independently and stand 1 minute safely    From Standing, Reach Forward  with Outstretched Arm Can reach forward >12 cm safely (5")    From Standing Position, Pick up Object from Floor Able to pick up shoe safely and easily    From Standing Position, Turn to Look Behind Over each Shoulder Looks behind from both sides and weight shifts well    Turn 360 Degrees Able to turn 360 degrees safely in 4 seconds or less    Standing Unsupported, Alternately Place Feet on Step/Stool Able to complete 4 steps without aid or supervision    Standing Unsupported, One Foot in Front Able to take small step independently and hold 30 seconds    Standing on One Leg Tries to lift leg/unable to hold 3 seconds but remains standing independently    Total Score 47              PATIENT EDUCATION: Education details:  Continue HEP and walk with family as able using RW. Person educated: Patient and Daughter and Daughter's fiance. Education method: Explanation Education comprehension: verbalized understanding and needs further education     HOME EXERCISE PROGRAM: Access Code: [email or web address]  You Can Walk For A Certain Length Of Time Each Day                          Walk 5 minutes 2 times per day.             Increase 2  minutes every 7 days              Work up to 20 minutes (1-2 times per day).               Example:                         Day 1-2           4-5 minutes     3 times per day                         Day 7-8           10-12 minutes 2-3 times per day                         Day 13-14       20-22 minutes 1-2 times per day    GOALS: Goals reviewed with patient? Yes   SHORT TERM GOALS: Target date: 01/24/2022   Pt will perform initial strength and balance HEP with supervision from family. Baseline:   Established, but pt would benefit from advancements. Goal status:  MET   2.  Pt will decrease 5xSTS to </= 20 seconds w/ UE support in order to demonstrate decreased risk for falls and improved functional bilateral LE strength and power. Baseline: 24.00 sec w/ BUE;  01/27/2022 18.68 sec w/ BUE support Goal status: MET   3.  Pt will demonstrate TUG of </=13 seconds safely in order to decrease risk of falls and improve functional mobility using LRAD. Baseline: 15.60 sec w/ RW, impulsive; 01/27/2022 16.94 sec w/ RW Goal status: NOT MET   4.  Pt will demonstrate a gait speed of >/=2.0 feet/sec w/ LRAD in order to decrease risk for falls. Baseline: 1.73 ft/sec w/ RW; 01/22/2022 2.55 ft/sec Goal status: MET   5.  Pt will increase BERG balance score to >/=40/56 to demonstrate improved static balance. Baseline: 34/56; 01/27/2022 47/56 Goal status: MET   LONG TERM GOALS: Target date: 02/21/2022   Pt will be compliant to structured walking program at least 3 days per week with supervision from sons or husband to promote maintained aerobic gains. Baseline: To be established. Goal status: INITIAL   2.  Pt will decrease 5xSTS to </=16 seconds w/ or w/o UE support in order to demonstrate decreased risk for falls and improved functional bilateral LE strength and power. Baseline: 24.00 sec w/ BUE support Goal status: INITIAL   3.  Pt will demonstrate a gait speed of >/=2.3 feet/sec w/ LRAD in order to decrease risk for falls. Baseline: 1.73 ft/sec w/ RW Goal status: INITIAL   4.  Pt will ambulate >/= 500 feet on level surfaces with LRAD and supervision level of assist to promote household and community access. Baseline: 115' SBA/CGA w/ RW Goal status: INITIAL   5.  Pt will increase FOTO score to 71% in order to demonstrate subjective functional improvement. Baseline: 58% Goal status: INITIAL   6.  Pt will increase BERG balance score to >/= 50/56 to demonstrate improved static balance. Baseline: 34/56; 01/27/2022 47/56 Goal status: REVISED   ASSESSMENT:   CLINICAL IMPRESSION: Emphasis of skilled session on assessment of STGs.  Pt made steady progress towards all goals only regressing her TUG time by roughly a second with improving safety of completion of  task.  Her 5xSTS improved to 18.68 sec and she ambulates with RW at a speed of 2.55 ft/sec.  Her BERG score improved past initial set LTG requiring revision as she scored a 47/56 today.  Will continue to work on stair safety, ambulation w/ LRAD, and advancement of current home program to continue progress with PT.   OBJECTIVE IMPAIRMENTS Abnormal gait, decreased activity tolerance, decreased balance, decreased cognition, decreased coordination, decreased endurance, decreased knowledge of condition, decreased knowledge of use of DME, decreased mobility, difficulty walking, decreased strength, decreased safety awareness, impaired UE functional use, improper body mechanics, and postural dysfunction.    ACTIVITY LIMITATIONS carrying, lifting, bending, sitting, standing, squatting, stairs, transfers, bed mobility, bathing, dressing, self feeding, hygiene/grooming, and locomotion level   PARTICIPATION LIMITATIONS: meal prep, cleaning, laundry, medication management, personal finances, interpersonal relationship, driving, shopping, community activity, and occupation   PERSONAL FACTORS Age, Behavior pattern, Fitness, Past/current experiences, Time since onset of injury/illness/exacerbation, Transportation, and 3+ comorbidities: HTN, DM2, acute CVA, depression, Huntington's like disease for ~7 years  are also affecting patient's functional outcome.    REHAB POTENTIAL: [place] personal factors and PMH   CLINICAL DECISION MAKING: Evolving/moderate complexity   EVALUATION COMPLEXITY: Moderate   PLAN: PT FREQUENCY: 2x/week   PT DURATION: 8 weeks   PLANNED INTERVENTIONS: Therapeutic exercises, Therapeutic activity, Neuromuscular re-education, Balance training, Gait training, Patient/Family education, Self Care, Stair training, Vestibular training, DME instructions, Manual therapy, and Re-evaluation   PLAN FOR NEXT SESSION: Modify HEP for posture in standing/LE strength/dynamic  balance-advance from current  HEP, gait training w/ RW vs NBQC rubber tip, SciFit for endurance/strengthening, hip/quad strength, coordination tasks, step ups w/o UE support >6", tilt board anteriorly oriented, tall hurdles, dot drills, obstacle course, Stairs   Bary Richard, PT, DPT 01/27/2022, 1:32 PM

## 2022-01-27 NOTE — Therapy (Signed)
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[place])    Hypertension    Stroke [facility])    Past Surgical History:  Procedure Laterality Date   CHOLECYSTECTOMY     LYMPH NODE DISSECTION     Patient Active Problem List   Diagnosis Date Noted   Acute ischemic stroke ([place]) 11/22/2021   Acute CVA (cerebrovascular accident) ([place]) 11/15/2021   Abnormal LFTs 11/15/2021   Hyperlipidemia associated with type 2 diabetes mellitus ([place]) 09/07/2017   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   Uncontrolled type 2 diabetes mellitus with hyperglycemia ([place]) 05/07/2017   Overweight (BMI 25.0-29.9) 09/20/2013   Difficulty sleeping 09/20/2013   Vitamin D deficiency 06/21/2013   Suicidal overdose ([place]) 12/06/2012   HTN (hypertension) 12/06/2012   Type 2 diabetes mellitus ([place]) 12/06/2012   Depression 07/13/2012   Anxiety 07/13/2012    ONSET DATE:  12/04/2021  REFERRING DIAG: I63.9 (ICD-10-CM) - Cerebral infarction, unspecified   THERAPY DIAG:  Cognitive communication deficit  Aphasia  Verbal apraxia  Rationale for Evaluation and Treatment Rehabilitation  SUBJECTIVE: "She stays in her room most of the day" spouse Hayley Bryant attended session  PAIN:  Are you having pain? No  OBJECTIVE:  TODAY'S TREATMENT:   01-27-22: Daughter and her fiance attended session. Family has not yet obtained pill organizer. They  attempted folding clothes, however Hayley Bryant refused, dumping clothes back in hamper. We generated strategy of limiting number of items to 5 items to fold or setting a timer for 10 minutes to improve motivation to attend to and complete simple task at home. Demonstrated use of questioning cues to allow for Hayley Bryant to problem solve rather than fixing and solving problems for her. They report Hayley Bryant does not want to talk much at home. Modeled description task to encourage verbal output and practice loud volume. They plan to get her an iPad - instructed them to download Talk Path. Volume targeted with loud Ah average of 85dB with occasional min A. Oral reading task of 8-9 word sentences averaged 68-70dB with some difficulty reading. Downgraded to 5-7 word sentences with average of 72dB with rare min A. Again instructed family and pt to bring notebook each session and keep all of there PT/OT/ST handouts in notebook. Suggested they get a bag for over her walker as well.   01-22-22: Spouse, Hayley Bryant present. Instructed him to get a pill organizer and set timer for Hayley Bryant to take meds. He reports poor safety awareness at home, as Storie forgets her walker or uses the stairs without requesting help. Eilis stated she could use the stairs without help, but is supposed to ask for help, indicated reduced awareness. With mod A, Hayley Bryant did verbalize she needed to ask for help on  the stairs. Hayley Bryant also reported Hayley Bryant on bills, calling him and her daughter repeatedly during the day to ask if a bill has been paid. Targeted recall of bills paid by generating a chart with 5 bills which Hayley Bryant ID'd and stated due dates with mod I. Family is to involve her in bill paying and she is to fill out the chart with the due date and date paid to aid in recall of bill payments and to reduce calls to family re: bills. Chart placed in page protector, instructed them to fill out each month with dry erase marker, post where Radiah  can see it and cue her to look at the chart when she asks if a bill has been paid. Provided another list of cognitive activities to do at home as spouse reports she is not doing these - educated him to encourage her to do them. She is with her daughter in law during the day - requested she also attend therapy.   01-15-22: HEP not completed, Family not present again - will send note to request their presence. Hayley Bryant endorsed that she gave her son the note about the easy open pill box. She reports she is watching TV most of the day. Educated her to start some easy household chores that she can physically do to improve cognition. HEP for dysarthria with occasional min A for volume and breath support and slow rate. She required 3 verbal cues to ID episodes of reduced intelligibility. In structured tasks generating sentences with tri-syllabic words- She generated 10 sentences with 5 cues for volume and reduced rate. Occasional mod A to ID and self correct errors.  01-13-22: HEP for dysarthria completed with rare min A for slow rate and over articulation, to be 95% intelligible in complex paragraph reading. Targeted verbal compensations for aphasia in structured task, Hayley Bryant generated 8 salient descriptions with usual mod A. She has not implemented a pill box yet, sent note home to obtain an easy to open pill box. She endorses writing in a notebook to help her memory. When asked if she can write, she said no, her family writes for her and she is not sure she can find her notebook, indicating reduced problem solving and awareness. Written instructions to bring in her memory notebook. In conversation, Hayley Bryant maintained 95% intelligibility with rare min A  01-10-22: Completed HEP to read paragraphs aloud to practice dysarthria strategies (big, slow, and loud). Pt was approximately ~90-95% intelligible in initial 10 minute conversation with good carryover of trained dysarthria strategies. Educated and trained memory  strategies today, as pt endorsed some reduced recall. Usual prompting and rephrasing required to ID functional memory deficits due to inconsistent responses possibly related to recall or confabulation? No family present to confirm today. Generated possible strategy for patient to trial pill box to aid more independent medication administration and requesting family to write down information to aid recall as needed. Pt endorsed desire to use pill box and phone calendar; however, overt fine motor difficulty exhibited indicating possible reduced insight into deficits. Reduced problem solving also noted, in which pt attempted to abandon tasks if initial difficulty exhibited (opening bottom tabs of pill box; stuck on specific phone screen). Reviewed anomia strategies, as pt exhibited difficulty with description in demo of mental picture strategy. Pt exhibited dysnomia (ex: crack in driveway, instead of garage door) but self-identified wrong word with extra time. SLP utilized moderate prompting to utilize description strategy to aid correction, in which pt able to ID targeted  word with first letter prompt after describing.   01-07-22: Pt reported practicing dysarthria strategies for HEP and recalled 2/3 targeted strategies independently. Targeted use of dysarthria strategies (loud, slow rate, and over-articulation) at paragraph level today. Reduced awareness and recognition of decline in volume, increased rate, and reduced articulatory precision exhibited today. SLP engaged family members in building patient awareness to target slowing rate and increasing loudness. Trialed varied visual aids to slow rate (tapping each word was most effective but inconsistent - benefited from SLP assistance to focus on each and every word). Loudness improved from mid 60s to low 70s dB during first 1-2 sentences and decayed as paragraph progressed. Verbal cues increased decaying volume to upper 60s dB. Oral motor fatigue reported during  structured practice. Recommended patient practice reading aloud with focus on strategies at least 10 minutes/twice a day to build patient endurance. Also re-educated use of description strategy to aid word retrieval at home with family prompting patient to describe or providing descriptions to aid anomia. All verbalized understanding and agreement with SLP recommendations.   01-03-22: Pt accompanied by eldest son today. Son endorsed her voice has not been as faint or weak in last several days. 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 Pt is experiencing ongoing dysnomia and anomia. SLP educated and instructed dysarthria compensations (slow rate, over-articulation, and volume), in which pt able to demonstrate at word and sentence level with usual fading to occasional min A. Demonstrated improving awareness of reduced volume and increased rate during structured tasks. Educated anomia compensations and targeted use of description strategy today via Chartered loss adjuster. Pt benefited from usual fading to occasional min prompting to verbalize particular components. SLP educated son on cuing hierarchy and allowing patient to have additional processing time, especially when word finding occurs.      PATIENT EDUCATION: Education details: see above Person educated: Patient and Child(ren) Education method: Customer service manager Education comprehension: verbalized understanding, returned demonstration, and needs further education     GOALS: Goals reviewed with patient? Yes   SHORT TERM GOALS: Target date: 01/16/2022     Pt will ID and attempt to correct speech errors (apraxic errors/dysnomia) with 80% accuracy on structured tasks given occasional mod A over sessions  Baseline: 01-10-22 Goal status: Achieved   2.  Pt will implement dysarthria compensations to be 90+% intelligible in 5-10 minute conversations given occasional mod A over 2 sessions Baseline:  01-10-22 Goal status: Achieved   3.   Pt will utilize word retrieval strategies on structured speech tasks with 80% accuracy given occasional mod A over 2 sessions  Baseline: 01-07-22 Goal status: Achieved   4.  Pt will use cognitive compensations to aid recall of functional information given occasional mod A over 2 sessions  Baseline:  Goal status: Not ,met   5.  Family will provide appropriate cues to support cognitive communication skills as demonstrated in [place] sessions given occasional min A over 2 sessions Baseline: 01-07-22 Goal status: Not met       LONG TERM GOALS: Target date: 02/14/2022    Pt will ID and attempt to correct speech errors (apraxic errors/dysnomia) with 90% accuracy in conversation given occasional min A over 2 sessions  Baseline:  Goal status: ongoing   2.  Pt will use trained dysarthria compensations to be 95+% intelligible in 15+ minute conversations given occasional min A over 2 sessions Baseline:  Goal status: ongoing   3.  Pt will utilize word retrieval strategies  to optimize ability to functionally communicate in personally relevant conversations given occasional min A over 2 sessions Baseline:  Goal status: ongoing   4.  Pt will use cognitive compensations to aid recall of functional information given occasional min A over 2 sessions Baseline:  Goal status: ongoing   5.  Pt will report improved communication effectiveness via PROM by 2 points at last ST session  Baseline: CPIB=15 Goal status: ongoing     ASSESSMENT:   CLINICAL IMPRESSION: Patient is a 51 y.o. female who was seen today for CVA in June 2023. PMHX significant for anxiety, depression, and Huntington like disease.Overall improved intelligibility to 90-95% in conversation in quiet environment. Word finding has improved as well, and in structured tasks she can use verbal compensations for word finding - ongoing training for carryover of compensations in conversation. Memory and attention  continue to be moderately impaired. Family needs to attend some sessions to [facility] in carryover of HEP and strategies to support her cognition and intelligibility. Pt would benefit from skilled ST intervention to address speech, language, and cognitive changes to optimize communication effectiveness and functional independence.    OBJECTIVE IMPAIRMENTS include memory, aphasia, apraxia, and dysarthria. These impairments are limiting patient from household responsibilities and effectively communicating at home and in community. Factors affecting potential to achieve goals and functional outcome are medical prognosis and previous level of function. Patient will benefit from skilled SLP services to address above impairments and improve overall function.   REHAB POTENTIAL: Good   PLAN: SLP FREQUENCY: 2x/week   SLP DURATION: 8 weeks   PLANNED INTERVENTIONS: Language facilitation, Environmental controls, Cueing hierachy, Cognitive reorganization, Internal/external aids, Functional tasks, Multimodal communication approach, SLP instruction and feedback, Compensatory    Hayley Bryant, Annye Rusk, CCC-SLP 01/27/2022, 1:42 PM

## 2022-01-27 NOTE — Patient Instructions (Signed)
  Bring notebook each session - put all of your exercises  in it   Get a pill organizer - have Alexah use of timer or routine to recall and request am meds  There are easy open ones on amaz[employer] if needed  Use questioning to help her problem solve and process rather than doing things for her - this is a balance between her frustration and your time limits - do this when you can, not all the time  Set a timer for 10 minutes and encourage her to work for 10 minutes  Or set a limit, for example, fold 5 towels or sort this (small) pile of silver to sort  Write a to do list of 3-4 things she needs to accomplish a day on a white board  Talk Path is a free app for I pad - down load and bring in tablet

## 2022-01-29 NOTE — Therapy (Deleted)
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[place])    Hypertension    Stroke [facility])    Past Surgical History:  Procedure Laterality Date   CHOLECYSTECTOMY     LYMPH NODE DISSECTION     Patient Active Problem List   Diagnosis Date Noted   Acute ischemic stroke ([place]) 11/22/2021   Acute CVA (cerebrovascular accident) ([place]) 11/15/2021   Abnormal LFTs 11/15/2021   Hyperlipidemia associated with type 2 diabetes mellitus ([place]) 09/07/2017   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  Rationale for Evaluation and Treatment Rehabilitation  SUBJECTIVE: ***  PAIN:  Are you having pain? No  OBJECTIVE:  TODAY'S TREATMENT:   01-29-22: ***  01-27-22: Daughter and her fiance attended session. Family has not yet obtained pill organizer. They attempted folding clothes, however Tequila refused, dumping clothes back in hamper. We generated strategy of limiting number of items to 5 items to fold or setting a timer for 10 minutes to improve motivation to attend to and complete simple task at home. Demonstrated use of questioning cues to allow for Yoselyn to problem solve rather than fixing and  solving problems for her. They report Idolina does not want to talk much at home. Modeled description task to encourage verbal output and practice loud volume. They plan to get her an iPad - instructed them to download Talk Path. Volume targeted with loud Ah average of 85dB with occasional min A. Oral reading task of 8-9 word sentences averaged 68-70dB with some difficulty reading. Downgraded to 5-7 word sentences with average of 72dB with rare min A. Again instructed family and pt to bring notebook each session and keep all of there PT/OT/ST handouts in notebook. Suggested they get a bag for over her walker as well.   01-22-22: Spouse, [name] present. Instructed him to get a pill organizer and set timer for Rhyder to take meds. He reports poor safety awareness at home, as Timmie forgets her walker or uses the stairs without requesting help. Cash stated she could use the stairs without help, but is supposed to ask for help, indicated reduced awareness. With mod A, Aleaha did verbalize she needed to ask for help on the stairs. [name] also reported [name] on bills, calling him and her daughter repeatedly during the day to ask if a bill has been paid. Targeted recall of bills paid by generating a chart with 5 bills which Zaela ID'd and stated due dates with mod I. Family is to involve her in bill paying and she is to fill out the chart with the due date and date paid to aid in recall of bill payments and to reduce calls to family re:  bills. Chart placed in page protector, instructed them to fill out each month with dry erase marker, post where Ronie can see it and cue her to look at the chart when she asks if a bill has been paid. Provided another list of cognitive activities to do at home as spouse reports she is not doing these - educated him to encourage her to do them. She is with her daughter in law during the day - requested she also attend therapy.   PATIENT EDUCATION: Education details:  see above Person educated: Patient and Child(ren) Education method: Customer service manager Education comprehension: verbalized understanding, returned demonstration, and needs further education     GOALS: Goals reviewed with patient? Yes   SHORT TERM GOALS: Target date: 01/16/2022     Pt will ID and attempt to correct speech errors (apraxic errors/dysnomia) with 80% accuracy on structured tasks given occasional mod A over sessions  Baseline: 01-10-22 Goal status: Achieved   2.  Pt will implement dysarthria compensations to be 90+% intelligible in 5-10 minute conversations given occasional mod A over 2 sessions Baseline: 01-10-22 Goal status: Achieved   3.   Pt will utilize word retrieval strategies on structured speech tasks with 80% accuracy given occasional mod A over 2 sessions  Baseline: 01-07-22 Goal status: Achieved   4.  Pt will use cognitive compensations to aid recall of functional information given occasional mod A over 2 sessions  Baseline:  Goal status: Not ,met   5.  Family will provide appropriate cues to support cognitive communication skills as demonstrated in [place] sessions given occasional min A over 2 sessions Baseline: 01-07-22 Goal status: Not met       LONG TERM GOALS: Target date: 02/14/2022    Pt will ID and attempt to correct speech errors (apraxic errors/dysnomia) with 90% accuracy in conversation given occasional min A over 2 sessions  Baseline:  Goal status: ongoing   2.  Pt will use trained dysarthria compensations to be 95+% intelligible in 15+ minute conversations given occasional min A over 2 sessions Baseline:  Goal status: ongoing   3.  Pt will utilize word retrieval strategies to optimize ability to functionally communicate in personally relevant conversations given occasional min A over 2 sessions Baseline:  Goal status: ongoing   4.  Pt will use cognitive compensations to aid recall of functional information given occasional min A over 2  sessions Baseline:  Goal status: ongoing   5.  Pt will report improved communication effectiveness via PROM by 2 points at last ST session  Baseline: CPIB=15 Goal status: ongoing     ASSESSMENT:   CLINICAL IMPRESSION: Patient is a 51 y.o. female who was seen today for CVA in June 2023. PMHX significant for anxiety, depression, and Huntington like disease.Overall improved intelligibility to 90-95% in conversation in quiet environment. Word finding has improved as well, and in structured tasks she can use verbal compensations for word finding - ongoing training for carryover of compensations in conversation. Memory and attention continue to be moderately impaired. Family needs to attend some sessions to [facility] in carryover of HEP and strategies to support her cognition and intelligibility. Pt would benefit from skilled ST intervention to address speech, language, and cognitive changes to optimize communication effectiveness and functional independence.    OBJECTIVE IMPAIRMENTS include memory, aphasia, apraxia, and dysarthria. These impairments are limiting patient from household responsibilities and effectively communicating at home and in community. Factors affecting potential to achieve goals and functional outcome are medical  prognosis and previous level of function. Patient will benefit from skilled SLP services to address above impairments and improve overall function.   REHAB POTENTIAL: Good   PLAN: SLP FREQUENCY: 2x/week   SLP DURATION: 8 weeks   PLANNED INTERVENTIONS: Language facilitation, Environmental controls, Cueing hierachy, Cognitive reorganization, Internal/external aids, Functional tasks, Multimodal communication approach, SLP instruction and feedback, Compensatory    Su Monks, CCC-SLP 01/29/2022, 4:16 PM

## 2022-01-30 ENCOUNTER — Ambulatory Visit: Payer: Managed Care, Other (non HMO) | Admitting: Speech Pathology

## 2022-01-30 ENCOUNTER — Ambulatory Visit: Payer: Managed Care, Other (non HMO) | Admitting: Occupational Therapy

## 2022-01-30 ENCOUNTER — Ambulatory Visit: Payer: Managed Care, Other (non HMO) | Admitting: Physical Therapy

## 2022-02-05 ENCOUNTER — Encounter: Payer: Self-pay | Admitting: Physical Therapy

## 2022-02-05 ENCOUNTER — Ambulatory Visit: Payer: Managed Care, Other (non HMO) | Admitting: Speech Pathology

## 2022-02-05 ENCOUNTER — Ambulatory Visit: Payer: Managed Care, Other (non HMO) | Attending: Physician Assistant | Admitting: Physical Therapy

## 2022-02-05 ENCOUNTER — Ambulatory Visit: Payer: Managed Care, Other (non HMO) | Admitting: Occupational Therapy

## 2022-02-05 DIAGNOSIS — R41841 Cognitive communication deficit: Secondary | ICD-10-CM | POA: Insufficient documentation

## 2022-02-05 DIAGNOSIS — R471 Dysarthria and anarthria: Secondary | ICD-10-CM

## 2022-02-05 DIAGNOSIS — R2689 Other abnormalities of gait and mobility: Secondary | ICD-10-CM | POA: Insufficient documentation

## 2022-02-05 DIAGNOSIS — I69351 Hemiplegia and hemiparesis following cerebral infarction affecting right dominant side: Secondary | ICD-10-CM | POA: Insufficient documentation

## 2022-02-05 DIAGNOSIS — R278 Other lack of coordination: Secondary | ICD-10-CM

## 2022-02-05 DIAGNOSIS — R2681 Unsteadiness on feet: Secondary | ICD-10-CM

## 2022-02-05 DIAGNOSIS — R482 Apraxia: Secondary | ICD-10-CM | POA: Diagnosis present

## 2022-02-05 DIAGNOSIS — M6281 Muscle weakness (generalized): Secondary | ICD-10-CM | POA: Insufficient documentation

## 2022-02-05 DIAGNOSIS — R4701 Aphasia: Secondary | ICD-10-CM | POA: Insufficient documentation

## 2022-02-05 NOTE — Therapy (Signed)
OUTPATIENT PHYSICAL THERAPY TREATMENT NOTE   Patient Name: Hayley Bryant MRN: [id number] DOB:09-28-1970, 51 y.o., female Today's Date: 02/05/2022  PCP: Barrie Lyme, FNP REFERRING PROVIDER: Cathlyn Parsons, PA-C   END OF SESSION:   PT End of Session - 02/05/22 3086     Visit Number 9    Number of Visits 17   16+eval   Date for PT Re-Evaluation 03/07/22   pushed out due to patient preference for same clinician and delay with scheduling.   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[place])    Hypertension    Stroke [facility])    Past Surgical History:  Procedure Laterality Date   CHOLECYSTECTOMY     LYMPH NODE DISSECTION     Patient Active Problem List   Diagnosis Date Noted   Acute ischemic stroke ([place]) 11/22/2021   Acute CVA (cerebrovascular accident) ([place]) 11/15/2021   Abnormal LFTs 11/15/2021   Hyperlipidemia associated with type 2 diabetes mellitus ([place]) 09/07/2017   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   Uncontrolled type 2 diabetes mellitus with hyperglycemia ([place]) 05/07/2017   Overweight (BMI 25.0-29.9) 09/20/2013   Difficulty sleeping 09/20/2013   Vitamin D deficiency 06/21/2013   Suicidal overdose ([place]) 12/06/2012   HTN (hypertension) 12/06/2012   Type 2 diabetes mellitus ([place]) 12/06/2012   Depression 07/13/2012   Anxiety 07/13/2012    REFERRING DIAG: I63.9 (ICD-10-CM) - Cerebral infarction, unspecified   THERAPY DIAG:  Hemiplegia and hemiparesis following cerebral infarction affecting right dominant side (HCC)  Other abnormalities of gait and mobility  Muscle weakness  (generalized)  Unsteadiness on feet  Rationale for Evaluation and Treatment Rehabilitation  PERTINENT HISTORY: Presented to [facility] long emergency department on 11/15/2021 with slurred speech and confusion.  CT head and CTA of the head performed without acute findings or LVO. MRI found left basal ganglia infarct.  Admitted to rehab 11/22/2021 until 12/06/2021.   PMH:  Suicidal overdose, HTN, DM2, Acute CVA, depression, Huntington's like disease, hyperlipidemia, anxiety  PRECAUTIONS: Fall  SUBJECTIVE:  "I fell.  I just rolled out of the bed on Tuesday."  Pt endorses that she did not realize how close she was to the edge of the bed and rolled onto the floor.  Her son came in and got her out of the floor and they used a walker to stand up.  She states she was uninjured and had not bruises or scrapes.  Otherwise no issues to report.  She walked outside with her son for about 7 minutes one day this week.  Pt accompanied by: self  PAIN:  Are you having pain? No   OBJECTIVE: (objective measures completed at initial evaluation unless otherwise dated)   TODAY'S TREATMENT:  Verbally reviewed HEP.  Currently too easy for pt's functional level so modified below: -fwd/bckwrd tandem walk at counter 4x10'; pt mildly hypermetric when walking backward in tandem w/ good self-correction throughout following single cue at first occurrence -fwd/bckwrd monster walk w/ green theraband at thighs 4x10' -squat w/  counter support and chair behind (setup recommended for home) 2x8  GAIT: Gait pattern: step to pattern, decreased stance time- Right, trunk rotated posterior- Left, and wide BOS Distance walked: 115' + 20' (quad tip cane) + 345' Assistive device utilized:  quad tip cane and None Level of assistance: SBA Comments: Pt ambulates with good stride and foot clearance when without AD.  She has difficulty with sequencing with cane that greatly slows gait speed.  At this time she is physically progressing towards  no AD needs for ambulation, but may remain at supervision level due to cognitive limitations.    PATIENT EDUCATION: Education details:  Discussed using chair and process of getting out of floor safely w/o pulling to stand with RW.  Continue HEP and walk with family as able using RW.  Discussed safety in familiar environements, short distances, and level surfaces when without RW having supervision. Person educated: Patient and Daughter and Daughter's fiance. Education method: Explanation Education comprehension: verbalized understanding and needs further education     HOME EXERCISE PROGRAM: Access Code: [email or web address]  You Can Walk For A Certain Length Of Time Each Day                          Walk 5 minutes 2 times per day.             Increase 2  minutes every 7 days              Work up to 20 minutes (1-2 times per day).               Example:                         Day 1-2           4-5 minutes     3 times per day                         Day 7-8           10-12 minutes 2-3 times per day                         Day 13-14       20-22 minutes 1-2 times per day    GOALS: Goals reviewed with patient? Yes   SHORT TERM GOALS: Target date: 01/24/2022   Pt will perform initial strength and balance HEP with supervision from family. Baseline:   Established, but pt would benefit from advancements. Goal status:  MET   2.  Pt will decrease 5xSTS to </= 20 seconds w/ UE support in order to demonstrate decreased risk for falls and improved functional bilateral LE strength and power. Baseline: 24.00 sec w/ BUE; 01/27/2022 18.68 sec w/ BUE support Goal status: MET   3.  Pt will demonstrate TUG of </=13 seconds safely in order to decrease risk of falls and improve functional mobility using LRAD. Baseline: 15.60 sec w/ RW, impulsive; 01/27/2022 16.94 sec w/ RW Goal status: NOT MET   4.  Pt will demonstrate a gait speed of >/=2.0 feet/sec w/ LRAD in order to decrease risk for falls. Baseline: 1.73  ft/sec w/ RW; 01/22/2022 2.55 ft/sec Goal status: MET   5.  Pt will increase BERG balance score to >/=40/56 to demonstrate improved static balance. Baseline: 34/56; 01/27/2022 47/56 Goal status: MET   LONG TERM  GOALS: Target date: 02/21/2022   Pt will be compliant to structured walking program at least 3 days per week with supervision from sons or husband to promote maintained aerobic gains. Baseline: To be established. Goal status: INITIAL   2.  Pt will decrease 5xSTS to </=16 seconds w/ or w/o UE support in order to demonstrate decreased risk for falls and improved functional bilateral LE strength and power. Baseline: 24.00 sec w/ BUE support Goal status: INITIAL   3.  Pt will demonstrate a gait speed of >/=2.3 feet/sec w/ LRAD in order to decrease risk for falls. Baseline: 1.73 ft/sec w/ RW Goal status: INITIAL   4.  Pt will ambulate >/= 500 feet on level surfaces with LRAD and supervision level of assist to promote household and community access. Baseline: 115' SBA/CGA w/ RW Goal status: INITIAL   5.  Pt will increase FOTO score to 71% in order to demonstrate subjective functional improvement. Baseline: 58% Goal status: INITIAL   6.  Pt will increase BERG balance score to >/= 50/56 to demonstrate improved static balance. Baseline: 34/56; 01/27/2022 47/56 Goal status: REVISED   ASSESSMENT:   CLINICAL IMPRESSION: Time spent in session modifying HEP to meet pt's current functional level.  She was challenged by gait training with use of quad tip cane demonstrating increased difficulty sequencing and maintaining gait speed.  She will benefit from further PT services to improve gait and overall safety without dependence on an AD likely requiring ongoing supervision.   OBJECTIVE IMPAIRMENTS Abnormal gait, decreased activity tolerance, decreased balance, decreased cognition, decreased coordination, decreased endurance, decreased knowledge of condition, decreased knowledge of use of DME,  decreased mobility, difficulty walking, decreased strength, decreased safety awareness, impaired UE functional use, improper body mechanics, and postural dysfunction.    ACTIVITY LIMITATIONS carrying, lifting, bending, sitting, standing, squatting, stairs, transfers, bed mobility, bathing, dressing, self feeding, hygiene/grooming, and locomotion level   PARTICIPATION LIMITATIONS: meal prep, cleaning, laundry, medication management, personal finances, interpersonal relationship, driving, shopping, community activity, and occupation   PERSONAL FACTORS Age, Behavior pattern, Fitness, Past/current experiences, Time since onset of injury/illness/exacerbation, Transportation, and 3+ comorbidities: HTN, DM2, acute CVA, depression, Huntington's like disease for ~7 years  are also affecting patient's functional outcome.    REHAB POTENTIAL: Fair See personal factors and PMH   CLINICAL DECISION MAKING: Evolving/moderate complexity   EVALUATION COMPLEXITY: Moderate   PLAN: PT FREQUENCY: 2x/week   PT DURATION: 8 weeks   PLANNED INTERVENTIONS: Therapeutic exercises, Therapeutic activity, Neuromuscular re-education, Balance training, Gait training, Patient/Family education, Self Care, Stair training, Vestibular training, DME instructions, Manual therapy, and Re-evaluation   PLAN FOR NEXT SESSION: Modify HEP prn for posture in standing/LE strength/dynamic balance-advance from current HEP, gait training w/o RW-outside, SciFit for endurance/strengthening, hip/quad strength, coordination tasks, step ups w/o UE support >6", tilt board anteriorly oriented, tall hurdles, dot drills, obstacle course, [place], PT, DPT 02/05/2022, 11:08 AM

## 2022-02-05 NOTE — Patient Instructions (Addendum)
  Coordination Activities  Perform the following activities for 15 minutes 1-2 times per day with right hand(s). May need to practice in Lt hand before doing in Rt hand. Do all below exercises SEATED  Flip ball over in hand multiple times.  Toss ball or bean bag between hands.  Toss volleyball sized ball back and forth to family member, THEN try and toss up with both hands and catch with both hands  Flip cards 1 at a time as fast as you can.  Deal cards with your thumb (Hold deck in hand and push top card off with thumb one at a time). Make sure elbow/forearm is supported on table  Pick up coins, buttons, marbles, dried beans/pasta, etc of different sizes and place in container.  Pick up coins and stack.  Practice writing name, simple sentences and address - may be helpful to have someone else write it above and then she copies below it  Continue to use Rt hand to eat, wash face/body, brush hair

## 2022-02-05 NOTE — Therapy (Signed)
OUTPATIENT OCCUPATIONAL THERAPY NEURO TREATMENT  Patient Name: Hayley Bryant MRN: [id number] DOB:1971-01-10, 51 y.o., female Today's Date: 02/05/2022  PCP: Gwenlyn Perking REFERRING PROVIDER: Silvestre Mesi   OT End of Session - 02/05/22 1104     Visit Number 2    Number of Visits 17    Date for OT Re-Evaluation 02/26/22    Authorization Type [employer] [id number]    OT Start Time 1100    OT Stop Time 0093    OT Time Calculation (min) 45 min    Activity Tolerance Patient tolerated treatment well    Behavior During Therapy Flat affect             Past Medical History:  Diagnosis Date   Anxiety    Depression    Diabetes mellitus    Fibroids    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[place])    Hypertension    Stroke [facility])    Past Surgical History:  Procedure Laterality Date   CHOLECYSTECTOMY     LYMPH NODE DISSECTION     Patient Active Problem List   Diagnosis Date Noted   Acute ischemic stroke ([place]) 11/22/2021   Acute CVA (cerebrovascular accident) ([place]) 11/15/2021   Abnormal LFTs 11/15/2021   Hyperlipidemia associated with type 2 diabetes mellitus ([place]) 09/07/2017   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   Uncontrolled type 2 diabetes mellitus with hyperglycemia ([place]) 05/07/2017   Overweight (BMI 25.0-29.9) 09/20/2013   Difficulty sleeping 09/20/2013   Vitamin D deficiency 06/21/2013   Suicidal overdose ([place]) 12/06/2012   HTN (hypertension) 12/06/2012   Type 2 diabetes mellitus ([place]) 12/06/2012   Depression 07/13/2012   Anxiety 07/13/2012    ONSET DATE: DOA: 11/15/2021   REFERRING DIAG: Cerebral infarction  THERAPY DIAG:  Hemiplegia and hemiparesis following cerebral infarction affecting right dominant side (HCC)  Muscle weakness (generalized)  Unsteadiness on feet  Other lack of coordination  Rationale for Evaluation and Treatment Rehabilitation  SUBJECTIVE:   SUBJECTIVE STATEMENT: I"m dressing myself and bathing myself now Pt accompanied by:  son  PERTINENT HISTORY:  Huntington's type II, Diabetes  PRECAUTIONS: Fall  WEIGHT BEARING RESTRICTIONS No  PAIN:  Are you having pain? No  FALLS: Has patient fallen in last 6 months? Yes. Number of falls 1 - 12/25/21  LIVING ENVIRONMENT: Lives with: lives with their family and lives with their son Lives in: House/apartment (HOUSE)  Stairs: Yes: Internal: 13 steps; on right going up and External: 0 steps; none Has following equipment at home: Gilford Rile - 2 wheeled, Environmental consultant - 4 wheeled, Manufacturing engineer  PLOF: Independent with basic ADLs  PATIENT GOALS  Write her name  OBJECTIVE:   HAND DOMINANCE: Right  PERCEPTION: Impaired: Inattention/neglect: does not attend to right side of body  PRAXIS: Impaired: Initiation and Motor planning   TODAY'S TREATMENT:  Practiced writing with various sized pens - pt did best w/ slightly built up pen using coban Issued coordination HEP - see pt instructions for details. Modified ex's d/t apraxia  PATIENT EDUCATION: Education details: coordination HEP  Person educated: Patient and Child(ren) Education method: Explanation, Demonstration, Verbal cues, and Handouts Education comprehension: verbalized understanding, returned demonstration, verbal cues required, and needs further education    HOME EXERCISE PROGRAM: 02/05/22: coordination HEP for Rt hand     GOALS: Goals reviewed with patient? Yes  SHORT TERM GOALS: Target date: 01/26/22  Patient will complete HEP designed to improve coordination in RUE Baseline: Goal status: IN PROGRESS  2.  Patient will don bra and pull over shirt  with min assist Baseline:  Goal status: MET  3.  Patient will use utensils in RUE to feed herself 30% meal Baseline:  Goal status: MET - Pt reports using Rt hand w/ utensils, using Lt hand for fingerfoods  4.  Patient will dress lower body with min assist Baseline:  Goal status: MET (elastic shoelaces)     LONG TERM GOALS: Target date: 02/26/22  Patient will complete  updated home activity program designed to improve functional use of RUE Baseline:  Goal status: INITIAL  2.  Patient will dress herself with supervision and verbal cueing Baseline:  Goal status: INITIAL  3.  Patient will shower with distant supervision Baseline:  Goal status: INITIAL  4.  Patient will complete at least 20 block in one minute (box and blocks) Baseline:  Goal status: INITIAL  5.  Patient will complete toileting with modified independence Baseline:  Goal status: INITIAL    ASSESSMENT:  CLINICAL IMPRESSION: Patient returns today for first time since evaluation in over a month. Pt has met 3/4 STG's at this time. 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   IMPAIRMENTS are limiting patient from ADLs and IADLs.   COMORBIDITIES has co-morbidities such as DM and Huntington's   that affects occupational performance. Patient will benefit from skilled OT to address above impairments and improve overall function.  MODIFICATION OR ASSISTANCE TO COMPLETE EVALUATION: Min-Moderate modification of tasks or assist with assess necessary to complete an evaluation.  OT OCCUPATIONAL PROFILE AND HISTORY: Detailed assessment: Review of records and additional review of physical, cognitive, psychosocial history related to current functional performance.  CLINICAL DECISION MAKING: Moderate - several treatment options, min-mod task modification necessary  REHAB POTENTIAL: Fair H/o Huntington's Type II  EVALUATION COMPLEXITY: Moderate    PLAN: OT FREQUENCY: 2x/week  OT DURATION: 8 weeks  PLANNED INTERVENTIONS: self  care/ADL training, therapeutic exercise, therapeutic activity, neuromuscular re-education, balance training, functional mobility training, aquatic therapy, patient/family education, cognitive remediation/compensation, visual/perceptual remediation/compensation, and DME and/or AE instructions  RECOMMENDED OTHER SERVICES: Neuropsych  CONSULTED AND AGREED WITH PLAN OF CARE: Patient and family member/caregiver  PLAN FOR NEXT SESSION: Forced use RUE, review coordination HEP    Hans [place], OT 02/05/2022, 11:05 AM

## 2022-02-05 NOTE — Patient Instructions (Addendum)
Access Code: [facility] URL: [email or web address] Date: 02/05/2022 Prepared by: Elease Etienne  Exercises - Tandem Walking with Counter Support  - 1 x daily - 5 x weekly - 3 sets - 10 reps - Backward Tandem Walking with Counter Support  - 1 x daily - 5 x weekly - 3 sets - 10 reps - Forward Backward Monster Walk with Band at Thighs and Counter Support  - 1 x daily - 5 x weekly - 3 sets - 10 reps - Squat with Counter Support  - 1 x daily - 5 x weekly - 2 sets - 8 reps - place chair behind patient (written on HEP)

## 2022-02-05 NOTE — Therapy (Signed)
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[place])    Hypertension    Stroke Hayley Bryant)    Past Surgical History:  Procedure Laterality Date   CHOLECYSTECTOMY     LYMPH NODE DISSECTION     Patient Active Problem List   Diagnosis Date Noted   Acute ischemic stroke ([place]) 11/22/2021   Acute CVA (cerebrovascular accident) ([place]) 11/15/2021   Abnormal LFTs 11/15/2021   Hyperlipidemia associated with type 2 diabetes mellitus ([place]) 09/07/2017   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   Uncontrolled type 2 diabetes mellitus with hyperglycemia ([place]) 05/07/2017   Overweight (BMI 25.0-29.9) 09/20/2013   Difficulty sleeping 09/20/2013   Vitamin D deficiency 06/21/2013   Suicidal overdose ([place]) 12/06/2012   HTN (hypertension) 12/06/2012   Type 2 diabetes mellitus ([place]) 12/06/2012   Depression 07/13/2012   Anxiety 07/13/2012    ONSET DATE:  12/04/2021  REFERRING DIAG: I63.9 (ICD-10-CM) - Cerebral infarction, unspecified   THERAPY DIAG:  Cognitive communication deficit  Dysarthria and anarthria  Aphasia  Rationale for Evaluation and Treatment Rehabilitation  SUBJECTIVE: "She stays in her room most of the day" spouse [name] attended session  PAIN:  Are you having pain? No  OBJECTIVE:  TODAY'S TREATMENT:    02/05/22: Son present, targeted compensations for word finding in structured task - [name]  self correct verbal errors with supervision cues. She ID'd 6 spelling errors with consistent written cues. With frequent questioning cues, [name] used compensations for word finding in structured task 5x and in simple conversation re: roller coasters, she use verbal compensations as needed 3x to describe a coaster that she couldn't name to successfully have her son ID the coasters. Pill box has not been obtained. When asked the month, [name] stated "August" then self corrected to "July" With cues to use the September calendar, she ID'd the correct date.   01-27-22: Daughter and her fiance attended session. Family has not yet obtained pill organizer. They attempted folding clothes, however [name] refused, dumping clothes back in hamper. We generated strategy of limiting number of items to 5 items to fold or setting a timer for 10 minutes to improve motivation to attend to and complete simple task at home. Demonstrated use of questioning cues to allow for Brelyn to problem solve rather than fixing and solving problems for her. They report [name] does not want to talk much at home. Modeled description task to encourage verbal output and practice loud volume. They plan to get her an iPad - instructed them to download [employer]. Volume targeted with loud Ah average of 85dB with occasional min A. Oral reading task of 8-9 word sentences averaged 68-70dB with some difficulty reading. Downgraded to 5-7 word sentences with average of  72dB with rare min A. Again instructed family and pt to bring notebook each session and keep all of there PT/OT/ST handouts in notebook. Suggested they get a bag for over her walker as well.   01-22-22: Spouse, [name] present. Instructed him to get a pill organizer and set timer for [name] to take meds. He reports poor safety awareness at home, as [name] forgets her walker or uses the stairs without requesting help. [name] stated she could use the stairs without help, but is supposed to  ask for help, indicated reduced awareness. With mod A, [name] did verbalize she needed to ask for help on the stairs. [name] also reported [name] on bills, calling him and her daughter repeatedly during the day to ask if a bill has been paid. Targeted recall of bills paid by generating a chart with 5 bills which Ramiyah ID'd and stated due dates with mod I. Family is to involve her in bill paying and she is to fill out the chart with the due date and date paid to aid in recall of bill payments and to reduce calls to family re: bills. Chart placed in page protector, instructed them to fill out each month with dry erase marker, post where [name] can see it and cue her to look at the chart when she asks if a bill has been paid. Provided another list of cognitive activities to do at home as spouse reports she is not doing these - educated him to encourage her to do them. She is with her daughter in law during the day - requested she also attend therapy.   01-15-22: HEP not completed, Family not present again - will send note to request their presence. [name] endorsed that she gave her son the note about the easy open pill box. She reports she is watching TV most of the day. Educated her to start some easy household chores that she can physically do to improve cognition. HEP for dysarthria with occasional min A for volume and breath support and slow rate. She required 3 verbal cues to ID episodes of reduced intelligibility. In structured tasks generating sentences with tri-syllabic words- She generated 10 sentences with 5 cues for volume and reduced rate. Occasional mod A to ID and self correct errors.  01-13-22: HEP for dysarthria completed with rare min A for slow rate and over articulation, to be 95% intelligible in complex paragraph reading. Targeted verbal compensations for aphasia in structured task, Yazleen generated 8 salient descriptions with usual mod A. She has not implemented a pill box yet,  sent note home to obtain an easy to open pill box. She endorses writing in a notebook to help her memory. When asked if she can write, she said no, her family writes for her and she is not sure she can find her notebook, indicating reduced problem solving and awareness. Written instructions to bring in her memory notebook. In conversation, Derrick maintained 95% intelligibility with rare min A  01-10-22: Completed HEP to read paragraphs aloud to practice dysarthria strategies (big, slow, and loud). Pt was approximately ~90-95% intelligible in initial 10 minute conversation with good carryover of trained dysarthria strategies. Educated and trained memory strategies today, as pt endorsed some reduced recall. Usual prompting and rephrasing required to ID functional memory deficits due to inconsistent responses possibly related to recall or confabulation? No family present to confirm today. Generated possible strategy for patient to trial pill box to aid more independent medication administration and requesting family to write  down information to aid recall as needed. Pt endorsed desire to use pill box and phone calendar; however, overt fine motor difficulty exhibited indicating possible reduced insight into deficits. Reduced problem solving also noted, in which pt attempted to abandon tasks if initial difficulty exhibited (opening bottom tabs of pill box; stuck on specific phone screen). Reviewed anomia strategies, as pt exhibited difficulty with description in demo of mental picture strategy. Pt exhibited dysnomia (ex: crack in driveway, instead of garage door) but self-identified wrong word with extra time. SLP utilized moderate prompting to utilize description strategy to aid correction, in which pt able to ID targeted word with first letter prompt after describing.       PATIENT EDUCATION: Education details: see above Person educated: Patient and Child(ren) Education method: Holiday representative Education comprehension: verbalized understanding, returned demonstration, and needs further education     GOALS: Goals reviewed with patient? Yes   SHORT TERM GOALS: Target date: 01/16/2022     Pt will ID and attempt to correct speech errors (apraxic errors/dysnomia) with 80% accuracy on structured tasks given occasional mod A over sessions  Baseline: 01-10-22 Goal status: Achieved   2.  Pt will implement dysarthria compensations to be 90+% intelligible in 5-10 minute conversations given occasional mod A over 2 sessions Baseline: 01-10-22 Goal status: Achieved   3.   Pt will utilize word retrieval strategies on structured speech tasks with 80% accuracy given occasional mod A over 2 sessions  Baseline: 01-07-22 Goal status: Achieved   4.  Pt will use cognitive compensations to aid recall of functional information given occasional mod A over 2 sessions  Baseline:  Goal status: Not ,met   5.  Family will provide appropriate cues to support cognitive communication skills as demonstrated in [place] sessions given occasional min A over 2 sessions Baseline: 01-07-22 Goal status: Not met       LONG TERM GOALS: Target date: 02/14/2022    Pt will ID and attempt to correct speech errors (apraxic errors/dysnomia) with 90% accuracy in conversation given occasional min A over 2 sessions  Baseline:  Goal status: ongoing   2.  Pt will use trained dysarthria compensations to be 95+% intelligible in 15+ minute conversations given occasional min A over 2 sessions Baseline:  Goal status: ongoing   3.  Pt will utilize word retrieval strategies to optimize ability to functionally communicate in personally relevant conversations given occasional min A over 2 sessions Baseline:  Goal status: ongoing   4.  Pt will use cognitive compensations to aid recall of functional information given occasional min A over 2 sessions Baseline:  Goal status: ongoing   5.  Pt will report improved  communication effectiveness via PROM by 2 points at last ST session  Baseline: CPIB=15 Goal status: ongoing     ASSESSMENT:   CLINICAL IMPRESSION: Patient is a 51 y.o. female who was seen today for CVA in June 2023. PMHX significant for anxiety, depression, and Huntington like disease.Overall improved intelligibility to 90-95% in conversation in quiet environment. Word finding has improved as well, and in structured tasks she can use verbal compensations for word finding - ongoing training for carryover of compensations in conversation. Memory and attention continue to be moderately impaired. Family needs to attend some sessions to [facility] in carryover of HEP and strategies to support her cognition and intelligibility. Pt would benefit from skilled ST intervention to address speech, language, and cognitive changes to optimize communication effectiveness and functional independence.  OBJECTIVE IMPAIRMENTS include memory, aphasia, apraxia, and dysarthria. These impairments are limiting patient from household responsibilities and effectively communicating at home and in community. Factors affecting potential to achieve goals and functional outcome are medical prognosis and previous level of function. Patient will benefit from skilled SLP services to address above impairments and improve overall function.   REHAB POTENTIAL: Good   PLAN: SLP FREQUENCY: 2x/week   SLP DURATION: 8 weeks   PLANNED INTERVENTIONS: Language facilitation, Environmental controls, Cueing hierachy, Cognitive reorganization, Internal/external aids, Functional tasks, Multimodal communication approach, SLP instruction and feedback, Compensatory    Parlee Amescua, Annye Rusk, CCC-SLP 02/05/2022, 11:56 AM

## 2022-02-07 ENCOUNTER — Ambulatory Visit: Payer: Managed Care, Other (non HMO) | Admitting: Physical Therapy

## 2022-02-07 ENCOUNTER — Encounter: Payer: Self-pay | Admitting: Physical Therapy

## 2022-02-07 ENCOUNTER — Ambulatory Visit: Payer: Managed Care, Other (non HMO)

## 2022-02-07 DIAGNOSIS — I69351 Hemiplegia and hemiparesis following cerebral infarction affecting right dominant side: Secondary | ICD-10-CM | POA: Diagnosis not present

## 2022-02-07 DIAGNOSIS — R278 Other lack of coordination: Secondary | ICD-10-CM

## 2022-02-07 DIAGNOSIS — R2689 Other abnormalities of gait and mobility: Secondary | ICD-10-CM

## 2022-02-07 DIAGNOSIS — M6281 Muscle weakness (generalized): Secondary | ICD-10-CM

## 2022-02-07 DIAGNOSIS — R2681 Unsteadiness on feet: Secondary | ICD-10-CM

## 2022-02-07 NOTE — Therapy (Signed)
OUTPATIENT PHYSICAL THERAPY TREATMENT NOTE   Patient Name: Hayley Bryant MRN: [id number] DOB:07/30/1970, 51 y.o., female Today's Date: 02/07/2022  PCP: Barrie Lyme, FNP REFERRING PROVIDER: Cathlyn Parsons, PA-C   END OF SESSION:   PT End of Session - 02/07/22 1023     Visit Number 10    Number of Visits 17   16+eval   Date for PT Re-Evaluation 03/07/22   pushed out due to patient preference for same clinician and delay with scheduling.   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[place])    Hypertension    Stroke [facility])    Past Surgical History:  Procedure Laterality Date   CHOLECYSTECTOMY     LYMPH NODE DISSECTION     Patient Active Problem List   Diagnosis Date Noted   Acute ischemic stroke ([place]) 11/22/2021   Acute CVA (cerebrovascular accident) ([place]) 11/15/2021   Abnormal LFTs 11/15/2021   Hyperlipidemia associated with type 2 diabetes mellitus ([place]) 09/07/2017   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   Uncontrolled type 2 diabetes mellitus with hyperglycemia ([place]) 05/07/2017   Overweight (BMI 25.0-29.9) 09/20/2013   Difficulty sleeping 09/20/2013   Vitamin D deficiency 06/21/2013   Suicidal overdose ([place]) 12/06/2012   HTN (hypertension) 12/06/2012   Type 2 diabetes mellitus ([place]) 12/06/2012   Depression 07/13/2012   Anxiety 07/13/2012    REFERRING DIAG: I63.9 (ICD-10-CM) - Cerebral infarction, unspecified   THERAPY DIAG:  Hemiplegia and hemiparesis following cerebral infarction affecting right dominant side (HCC)  Muscle weakness (generalized)  Unsteadiness on feet  Other lack of coordination  Other  abnormalities of gait and mobility  Rationale for Evaluation and Treatment Rehabilitation  PERTINENT HISTORY: Presented to [facility] long emergency department on 11/15/2021 with slurred speech and confusion.  CT head and CTA of the head performed without acute findings or LVO. MRI found left basal ganglia infarct.  Admitted to rehab 11/22/2021 until 12/06/2021.   PMH:  Suicidal overdose, HTN, DM2, Acute CVA, depression, Huntington's like disease, hyperlipidemia, anxiety  PRECAUTIONS: Fall  SUBJECTIVE:  "No falls!  I did a lot of walking at the hospital yesterday, my son has a pre-op appt."  Pt accompanied by: self  PAIN:  Are you having pain? No   OBJECTIVE: (objective measures completed at initial evaluation unless otherwise dated)   TODAY'S TREATMENT:   STAIRS:  Level of Assistance: SBA  Stair Negotiation Technique: Alternating Pattern  Forwards with Single Rail on Right  Number of Stairs: 20   Height of Stairs: 6"  Comments: Pt demos one instance of step-to during initial descent of stairs.  She is able to complete ascent and descent w/ rail setup as at home for greater number of stairs than she has at home.  Edu on safe supervision and having someone bring RW to top or bottom of stairs for pt.    GAIT: Gait pattern: step through pattern, decreased stride length, decreased hip/knee flexion- Right, decreased hip/knee flexion- Left, shuffling, poor foot clearance- Right, and poor foot clearance-  Left Distance walked: 250' (w/ RW) + 250' (w/o RW) + 25' (fwd grass) + 10' (backward grass) + ~20' (sidewalk returning indoors) + 30' (indoors) + various clinic distances Assistive device utilized: Environmental consultant - 2 wheeled and None Level of assistance: SBA and CGA Comments: Initiated gait training on sidewalk w/ and without RW.  Pt is more fatigued on return without RW demonstrating slowed pace.  Pt demonstrates intermittent scuffing of BLE that worsens with intermittent narrowing and shortening of  stride as distance progresses.  Cued to improve BOS with pt unable to maintain.  Short bouts of ambulation in grass fwd and backward w/ increased cuing for step size.  She requires 3 rest breaks during ambulation tasks and prolonged rest following.  PATIENT EDUCATION: Education details:  Continue HEP and walking with family.  Stressed supervision with stairs and when walking without RW in home. Person educated: Patient and sons Education method: Explanation Education comprehension: verbalized understanding and needs further education     HOME EXERCISE PROGRAM: Access Code: [email or web address]  You Can Walk For A Certain Length Of Time Each Day                          Walk 5 minutes 2 times per day.             Increase 2  minutes every 7 days              Work up to 20 minutes (1-2 times per day).               Example:                         Day 1-2           4-5 minutes     3 times per day                         Day 7-8           10-12 minutes 2-3 times per day                         Day 13-14       20-22 minutes 1-2 times per day    GOALS: Goals reviewed with patient? Yes   SHORT TERM GOALS: Target date: 01/24/2022   Pt will perform initial strength and balance HEP with supervision from family. Baseline:   Established, but pt would benefit from advancements. Goal status:  MET   2.  Pt will decrease 5xSTS to </= 20 seconds w/ UE support in order to demonstrate decreased risk for falls and improved functional bilateral LE strength and power. Baseline: 24.00 sec w/ BUE; 01/27/2022 18.68 sec w/ BUE support Goal status: MET   3.  Pt will demonstrate TUG of </=13 seconds safely in order to decrease risk of falls and improve functional mobility using LRAD. Baseline: 15.60 sec w/ RW, impulsive; 01/27/2022 16.94 sec w/ RW Goal status: NOT MET   4.  Pt will demonstrate a gait speed of >/=2.0 feet/sec w/ LRAD in order to decrease risk for falls. Baseline: 1.73 ft/sec w/ RW; 01/22/2022 2.55  ft/sec Goal status: MET   5.  Pt will increase BERG balance score to >/=40/56 to demonstrate improved static balance. Baseline: 34/56; 01/27/2022 47/56 Goal status: MET   LONG TERM GOALS: Target date: 02/21/2022  Pt will be compliant to structured walking program at least 3 days per week with supervision from sons or husband to promote maintained aerobic gains. Baseline: To be established. Goal status: INITIAL   2.  Pt will decrease 5xSTS to </=16 seconds w/ or w/o UE support in order to demonstrate decreased risk for falls and improved functional bilateral LE strength and power. Baseline: 24.00 sec w/ BUE support Goal status: INITIAL   3.  Pt will demonstrate a gait speed of >/=2.3 feet/sec w/ LRAD in order to decrease risk for falls. Baseline: 1.73 ft/sec w/ RW Goal status: INITIAL   4.  Pt will ambulate >/= 500 feet on level surfaces with LRAD and supervision level of assist to promote household and community access. Baseline: 115' SBA/CGA w/ RW Goal status: INITIAL   5.  Pt will increase FOTO score to 71% in order to demonstrate subjective functional improvement. Baseline: 58% Goal status: INITIAL   6.  Pt will increase BERG balance score to >/= 50/56 to demonstrate improved static balance. Baseline: 34/56; 01/27/2022 47/56 Goal status: REVISED   ASSESSMENT:   CLINICAL IMPRESSION: Session spent gait training with and without RW primarily outside over unlevel sidewalk.  Pt remains somewhat limited by decreased endurance and gait deviations that are likely baseline for her.  Her safety with stair navigation continues to improve with progression to safe performance of simulated home setup using reciprocal stepping.  She continues to benefit from ongoing skilled PT POC to further address activity tolerance and safe independence with mobility as able.   OBJECTIVE IMPAIRMENTS Abnormal gait, decreased activity tolerance, decreased balance, decreased cognition, decreased coordination,  decreased endurance, decreased knowledge of condition, decreased knowledge of use of DME, decreased mobility, difficulty walking, decreased strength, decreased safety awareness, impaired UE functional use, improper body mechanics, and postural dysfunction.    ACTIVITY LIMITATIONS carrying, lifting, bending, sitting, standing, squatting, stairs, transfers, bed mobility, bathing, dressing, self feeding, hygiene/grooming, and locomotion level   PARTICIPATION LIMITATIONS: meal prep, cleaning, laundry, medication management, personal finances, interpersonal relationship, driving, shopping, community activity, and occupation   PERSONAL FACTORS Age, Behavior pattern, Fitness, Past/current experiences, Time since onset of injury/illness/exacerbation, Transportation, and 3+ comorbidities: HTN, DM2, acute CVA, depression, Huntington's like disease for ~7 years  are also affecting patient's functional outcome.    REHAB POTENTIAL: [place] personal factors and PMH   CLINICAL DECISION MAKING: Evolving/moderate complexity   EVALUATION COMPLEXITY: Moderate   PLAN: PT FREQUENCY: 2x/week   PT DURATION: 8 weeks   PLANNED INTERVENTIONS: Therapeutic exercises, Therapeutic activity, Neuromuscular re-education, Balance training, Gait training, Patient/Family education, Self Care, Stair training, Vestibular training, DME instructions, Manual therapy, and Re-evaluation   PLAN FOR NEXT SESSION: Modify HEP prn for posture in standing/LE strength/dynamic balance-advance from current HEP, SciFit for endurance/strengthening, hip/quad strength, coordination tasks, step ups w/o UE support >6", tilt board anteriorly oriented, tall hurdles, dot drills, obstacle course   Bary Richard, PT, DPT 02/07/2022, 11:30 AM

## 2022-02-10 ENCOUNTER — Ambulatory Visit: Payer: Managed Care, Other (non HMO) | Admitting: Occupational Therapy

## 2022-02-10 ENCOUNTER — Ambulatory Visit: Payer: Managed Care, Other (non HMO) | Admitting: Speech Pathology

## 2022-02-10 ENCOUNTER — Ambulatory Visit: Payer: Managed Care, Other (non HMO) | Admitting: Physical Therapy

## 2022-02-13 ENCOUNTER — Ambulatory Visit: Payer: Managed Care, Other (non HMO) | Admitting: Physical Therapy

## 2022-02-13 ENCOUNTER — Encounter: Payer: Managed Care, Other (non HMO) | Admitting: Occupational Therapy

## 2022-02-13 ENCOUNTER — Encounter: Payer: Managed Care, Other (non HMO) | Admitting: Speech Pathology

## 2022-02-18 ENCOUNTER — Ambulatory Visit: Payer: Managed Care, Other (non HMO) | Admitting: Physical Therapy

## 2022-02-18 ENCOUNTER — Ambulatory Visit: Payer: Managed Care, Other (non HMO) | Admitting: Speech Pathology

## 2022-02-18 ENCOUNTER — Ambulatory Visit: Payer: Managed Care, Other (non HMO) | Admitting: Occupational Therapy

## 2022-02-18 NOTE — Therapy (Deleted)
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[place])    Hypertension    Stroke [facility])    Past Surgical History:  Procedure Laterality Date   CHOLECYSTECTOMY     LYMPH NODE DISSECTION     Patient Active Problem List   Diagnosis Date Noted   Acute ischemic stroke ([place]) 11/22/2021   Acute CVA (cerebrovascular accident) ([place]) 11/15/2021   Abnormal LFTs 11/15/2021   Hyperlipidemia associated with type 2 diabetes mellitus ([place]) 09/07/2017   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  Rationale for Evaluation and Treatment Rehabilitation  SUBJECTIVE: ***  PAIN:  Are you having pain? No  OBJECTIVE:  TODAY'S TREATMENT:  02-18-22: ***  02/05/22: Son present, targeted compensations for word finding in structured task - Jaycelynn self correct verbal errors with supervision cues. She ID'd 6 spelling errors with consistent written cues. With frequent questioning cues, Yacine used compensations for word finding in structured task 5x and in simple conversation re: roller coasters, she use verbal compensations as needed 3x to describe a coaster that she couldn't name to successfully have her  son ID the coasters. Pill box has not been obtained. When asked the month, Nysia stated "August" then self corrected to "July" With cues to use the September calendar, she ID'd the correct date.   01-27-22: Daughter and her fiance attended session. Family has not yet obtained pill organizer. They attempted folding clothes, however Laurisa refused, dumping clothes back in hamper. We generated strategy of limiting number of items to 5 items to fold or setting a timer for 10 minutes to improve motivation to attend to and complete simple task at home. Demonstrated use of questioning cues to allow for Shirlyn to problem solve rather than fixing and solving problems for her. They report Vaneta does not want to talk much at home. Modeled description task to encourage verbal output and practice loud volume. They plan to get her an iPad - instructed them to download [employer]. Volume targeted with loud Ah average of 85dB with occasional min A. Oral reading task of 8-9 word sentences averaged 68-70dB with some difficulty reading. Downgraded to 5-7 word sentences with average of 72dB with rare min A. Again instructed family and pt to bring notebook each session and keep all of there PT/OT/ST handouts in notebook. Suggested they get a bag for over her walker as well.   01-22-22: Spouse, [name] present. Instructed him to get a pill organizer and set timer for Ifrah to take meds. He reports poor safety awareness at home, as Ashli forgets her walker or uses the stairs without requesting help. Zaia stated she could use the stairs without help, but is supposed to  ask for help, indicated reduced awareness. With mod A, Eris did verbalize she needed to ask for help on the stairs. Tony also reported Tommy perseverates on bills, calling him and her daughter repeatedly during the day to ask if a bill has been paid. Targeted recall of bills paid by generating a chart with 5 bills which Ariel ID'd and stated due dates with  mod I. Family is to involve her in bill paying and she is to fill out the chart with the due date and date paid to aid in recall of bill payments and to reduce calls to family re: bills. Chart placed in page protector, instructed them to fill out each month with dry erase marker, post where Blessen can see it and cue her to look at the chart when she asks if a bill has been paid. Provided another list of cognitive activities to do at home as spouse reports she is not doing these - educated him to encourage her to do them. She is with her daughter in law during the day - requested she also attend therapy.   01-15-22: HEP not completed, Family not present again - will send note to request their presence. Fiana endorsed that she gave her son the note about the easy open pill box. She reports she is watching TV most of the day. Educated her to start some easy household chores that she can physically do to improve cognition. HEP for dysarthria with occasional min A for volume and breath support and slow rate. She required 3 verbal cues to ID episodes of reduced intelligibility. In structured tasks generating sentences with tri-syllabic words- She generated 10 sentences with 5 cues for volume and reduced rate. Occasional mod A to ID and self correct errors.  01-13-22: HEP for dysarthria completed with rare min A for slow rate and over articulation, to be 95% intelligible in complex paragraph reading. Targeted verbal compensations for aphasia in structured task, Ronasia generated 8 salient descriptions with usual mod A. She has not implemented a pill box yet, sent note home to obtain an easy to open pill box. She endorses writing in a notebook to help her memory. When asked if she can write, she said no, her family writes for her and she is not sure she can find her notebook, indicating reduced problem solving and awareness. Written instructions to bring in her memory notebook. In conversation, Penelopi maintained 95%  intelligibility with rare min A  01-10-22: Completed HEP to read paragraphs aloud to practice dysarthria strategies (big, slow, and loud). Pt was approximately ~90-95% intelligible in initial 10 minute conversation with good carryover of trained dysarthria strategies. Educated and trained memory strategies today, as pt endorsed some reduced recall. Usual prompting and rephrasing required to ID functional memory deficits due to inconsistent responses possibly related to recall or confabulation? No family present to confirm today. Generated possible strategy for patient to trial pill box to aid more independent medication administration and requesting family to write down information to aid recall as needed. Pt endorsed desire to use pill box and phone calendar; however, overt fine motor difficulty exhibited indicating possible reduced insight into deficits. Reduced problem solving also noted, in which pt attempted to abandon tasks if initial difficulty exhibited (opening bottom tabs of pill box; stuck on specific phone screen). Reviewed anomia strategies, as pt exhibited difficulty with description in demo of mental picture strategy. Pt exhibited dysnomia (ex: crack in driveway, instead of garage door) but self-identified wrong word with extra   time. SLP utilized moderate prompting to utilize description strategy to aid correction, in which pt able to ID targeted word with first letter prompt after describing.       PATIENT EDUCATION: Education details: see above Person educated: Patient and Child(ren) Education method: Explanation and Demonstration Education comprehension: verbalized understanding, returned demonstration, and needs further education     GOALS: Goals reviewed with patient? Yes   SHORT TERM GOALS: Target date: 01/16/2022     Pt will ID and attempt to correct speech errors (apraxic errors/dysnomia) with 80% accuracy on structured tasks given occasional mod A over sessions  Baseline:  01-10-22 Goal status: Achieved   2.  Pt will implement dysarthria compensations to be 90+% intelligible in 5-10 minute conversations given occasional mod A over 2 sessions Baseline: 01-10-22 Goal status: Achieved   3.   Pt will utilize word retrieval strategies on structured speech tasks with 80% accuracy given occasional mod A over 2 sessions  Baseline: 01-07-22 Goal status: Achieved   4.  Pt will use cognitive compensations to aid recall of functional information given occasional mod A over 2 sessions  Baseline:  Goal status: Not ,met   5.  Family will provide appropriate cues to support cognitive communication skills as demonstrated in ST sessions given occasional min A over 2 sessions Baseline: 01-07-22 Goal status: Not met       LONG TERM GOALS: Target date: 02/14/2022    Pt will ID and attempt to correct speech errors (apraxic errors/dysnomia) with 90% accuracy in conversation given occasional min A over 2 sessions  Baseline:  Goal status: ongoing   2.  Pt will use trained dysarthria compensations to be 95+% intelligible in 15+ minute conversations given occasional min A over 2 sessions Baseline:  Goal status: ongoing   3.  Pt will utilize word retrieval strategies to optimize ability to functionally communicate in personally relevant conversations given occasional min A over 2 sessions Baseline:  Goal status: ongoing   4.  Pt will use cognitive compensations to aid recall of functional information given occasional min A over 2 sessions Baseline:  Goal status: ongoing   5.  Pt will report improved communication effectiveness via PROM by 2 points at last ST session  Baseline: CPIB=15 Goal status: ongoing     ASSESSMENT:   CLINICAL IMPRESSION: Patient is a 51 y.o. female who was seen today for CVA in June 2023. PMHX significant for anxiety, depression, and Huntington like disease.Overall improved intelligibility to 90-95% in conversation in quiet environment. Word finding  has improved as well, and in structured tasks she can use verbal compensations for word finding - ongoing training for carryover of compensations in conversation. Memory and attention continue to be moderately impaired. Family needs to attend some sessions to [facility] [place] in carryover of HEP and strategies to support her cognition and intelligibility. Pt would benefit from skilled ST intervention to address speech, language, and cognitive changes to optimize communication effectiveness and functional independence.    OBJECTIVE IMPAIRMENTS include memory, aphasia, apraxia, and dysarthria. These impairments are limiting patient from household responsibilities and effectively communicating at home and in community. Factors affecting potential to achieve goals and functional outcome are medical prognosis and previous level of function. Patient will benefit from skilled SLP services to address above impairments and improve overall function.   REHAB POTENTIAL: Good   PLAN: SLP FREQUENCY: 2x/week   SLP DURATION: 8 weeks   PLANNED INTERVENTIONS: Language facilitation, Environmental controls, Cueing hierachy, Cognitive reorganization, Internal/external aids, Functional   tasks, Multimodal communication approach, SLP instruction and feedback, Compensatory    Jessica L Thomas, CCC-SLP 02/18/2022, 8:37 AM 

## 2022-02-20 ENCOUNTER — Ambulatory Visit: Payer: Managed Care, Other (non HMO) | Admitting: Physical Therapy

## 2022-02-20 ENCOUNTER — Ambulatory Visit: Payer: Managed Care, Other (non HMO) | Admitting: Speech Pathology

## 2022-02-20 ENCOUNTER — Encounter: Payer: Self-pay | Admitting: Occupational Therapy

## 2022-02-20 ENCOUNTER — Encounter: Payer: Self-pay | Admitting: Physical Therapy

## 2022-02-20 ENCOUNTER — Ambulatory Visit: Payer: Managed Care, Other (non HMO) | Admitting: Occupational Therapy

## 2022-02-20 DIAGNOSIS — I69351 Hemiplegia and hemiparesis following cerebral infarction affecting right dominant side: Secondary | ICD-10-CM

## 2022-02-20 DIAGNOSIS — R278 Other lack of coordination: Secondary | ICD-10-CM

## 2022-02-20 DIAGNOSIS — M6281 Muscle weakness (generalized): Secondary | ICD-10-CM

## 2022-02-20 DIAGNOSIS — R482 Apraxia: Secondary | ICD-10-CM

## 2022-02-20 DIAGNOSIS — R2681 Unsteadiness on feet: Secondary | ICD-10-CM

## 2022-02-20 DIAGNOSIS — R4701 Aphasia: Secondary | ICD-10-CM

## 2022-02-20 DIAGNOSIS — R471 Dysarthria and anarthria: Secondary | ICD-10-CM

## 2022-02-20 DIAGNOSIS — R2689 Other abnormalities of gait and mobility: Secondary | ICD-10-CM

## 2022-02-20 DIAGNOSIS — R41841 Cognitive communication deficit: Secondary | ICD-10-CM

## 2022-02-20 NOTE — Therapy (Addendum)
OUTPATIENT OCCUPATIONAL THERAPY NEURO TREATMENT  Patient Name: Hayley Bryant MRN: [id number] DOB:1970/09/13, 51 y.o., female Today's Date: 02/20/2022  PCP: Gwenlyn Perking REFERRING PROVIDER: Silvestre Mesi   OT End of Session - 02/20/22 0934     Visit Number 3    Number of Visits 17    Date for OT Re-Evaluation 04/28/22    Authorization Type [employer] [id number]    OT Start Time 0933    OT Stop Time 8841    OT Time Calculation (min) 42 min    Activity Tolerance Patient tolerated treatment well    Behavior During Therapy Flat affect             Past Medical History:  Diagnosis Date   Anxiety    Depression    Diabetes mellitus    Fibroids    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[place])    Hypertension    Stroke [facility])    Past Surgical History:  Procedure Laterality Date   CHOLECYSTECTOMY     LYMPH NODE DISSECTION     Patient Active Problem List   Diagnosis Date Noted   Acute ischemic stroke ([place]) 11/22/2021   Acute CVA (cerebrovascular accident) ([place]) 11/15/2021   Abnormal LFTs 11/15/2021   Hyperlipidemia associated with type 2 diabetes mellitus ([place]) 09/07/2017   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   Uncontrolled type 2 diabetes mellitus with hyperglycemia ([place]) 05/07/2017   Overweight (BMI 25.0-29.9) 09/20/2013   Difficulty sleeping 09/20/2013   Vitamin D deficiency 06/21/2013   Suicidal overdose ([place]) 12/06/2012   HTN (hypertension) 12/06/2012   Type 2 diabetes mellitus ([place]) 12/06/2012   Depression 07/13/2012   Anxiety 07/13/2012    ONSET DATE: DOA: 11/15/2021   REFERRING DIAG: Cerebral infarction  THERAPY DIAG:  Hemiplegia and hemiparesis following cerebral infarction affecting right dominant side (HCC)  Other lack of coordination  Muscle weakness (generalized)  Unsteadiness on feet  Rationale for Evaluation and Treatment Rehabilitation  SUBJECTIVE:   SUBJECTIVE STATEMENT: I"m dressing myself and bathing myself now Pt accompanied by:  son  PERTINENT HISTORY:  Huntington's type II, Diabetes  PRECAUTIONS: Fall  WEIGHT BEARING RESTRICTIONS No  PAIN:  Are you having pain? No  FALLS: Has patient fallen in last 6 months? Yes. Number of falls 1 - 12/25/21  LIVING ENVIRONMENT: Lives with: lives with their family and lives with their son Lives in: House/apartment (HOUSE)  Stairs: Yes: Internal: 13 steps; on right going up and External: 0 steps; none Has following equipment at home: Gilford Rile - 2 wheeled, Environmental consultant - 4 wheeled, Manufacturing engineer  PLOF: Independent with basic ADLs  PATIENT GOALS  Write her name  OBJECTIVE:   HAND DOMINANCE: Right  PERCEPTION: Impaired: Inattention/neglect: does not attend to right side of body  PRAXIS: Impaired: Initiation and Motor planning   TODAY'S TREATMENT:  Reviewed coordination HEP with pt/son. Pt practiced each w/ cues. More difficulty w/ gross motor coordination d/t difficulty releasing ball (apraxia). Upgraded HEP as needed.   Practiced writing name x 2  Assessed goals and progress to date for renewal (see goal section and below measures)  Box & Blocks RUE = 26  9 hole peg Rt hand = 58 sec  Also discussed importance of consistent attendance in therapy and practicing at home for improvement.   PATIENT EDUCATION: Education details: coordination HEP  Person educated: Patient and Child(ren) Education method: Explanation, Demonstration, Verbal cues, and Handouts Education comprehension: verbalized understanding, returned demonstration, verbal cues required, and needs further education    HOME EXERCISE PROGRAM: 02/05/22:  coordination HEP for Rt hand     GOALS: Goals reviewed with patient? Yes  SHORT TERM GOALS: Target date: 03/28/22  Patient will complete HEP designed to improve coordination in RUE Baseline: Goal status: MET  2.  Patient will don bra and pull over shirt with min assist Baseline:  Goal status: MET  3.  Patient will use utensils in RUE to feed herself 30% meal Baseline:   Goal status: MET - Pt reports using Rt hand w/ utensils, using Lt hand for fingerfoods  4.  Patient will dress lower body with min assist Baseline:  Goal status: MET (elastic shoelaces)   5. Pt will cut food mod I level   Baseline:    Goal status: NEW for renewal period  6. Pt will tie shoe laces w/ both hands  Baseline:    Goal status: NEW for renewal period    LONG TERM GOALS: Target date: 04/28/22  Patient will complete updated home activity program designed to improve functional use of RUE Baseline:  Goal status: IN PROGRESS (continue for renewal period)   2.  Patient will dress herself with supervision and verbal cueing Baseline:  Goal status: MET w/ elastic shoelaces  3.  Patient will shower with distant supervision Baseline:  Goal status: IN PROGRESS - direct supervision  4.  Patient will complete at least 20 block in one minute (box and blocks) Baseline:  Goal status: MET - 26 blocks  5.  Patient will complete toileting with modified independence Baseline:  Goal status: MET  6. Pt will brush teeth 90% with Rt hand  Baseline: using mostly Lt hand  Goal status: NEW (for renewal period)   7. Pt will improve RUE function as evidenced by performing 32 blocks on Box & Blocks test  Baseline: 13, 26 at renewal  Goal status: NEW (for renewal period)   8. Pt will improve coordination on 9 hole peg test to 50 sec or less  Baseline: 58 sec  Goal status: NEW (for renewal period)   ASSESSMENT:  CLINICAL IMPRESSION: Patient has met all original STG's and 3/5 LTG's from evaluation despite inconsistent attendance. Pt will be renewed today for renewal period beginning 02/26/22. New goals added to POC for renewal period  PERFORMANCE DEFICITS in functional skills including coordination, dexterity, proprioception, sensation, tone, ROM, strength, FMC, GMC, mobility, balance, endurance, and UE functional use, cognitive skills including attention, memory, problem solving,  safety awareness, and sequencing, and psychosocial skills including coping strategies and interpersonal interactions.   IMPAIRMENTS are limiting patient from ADLs and IADLs.   COMORBIDITIES has co-morbidities such as DM and Huntington's   that affects occupational performance. Patient will benefit from skilled OT to address above impairments and improve overall function.  MODIFICATION OR ASSISTANCE TO COMPLETE EVALUATION: Min-Moderate modification of tasks or assist with assess necessary to complete an evaluation.  OT OCCUPATIONAL PROFILE AND HISTORY: Detailed assessment: Review of records and additional review of physical, cognitive, psychosocial history related to current functional performance.  CLINICAL DECISION MAKING: Moderate - several treatment options, min-mod task modification necessary  REHAB POTENTIAL: Fair H/o Huntington's Type II  EVALUATION COMPLEXITY: Moderate    PLAN: OT FREQUENCY: 1x/wk   OT DURATION: 8 weeks beginning 02/26/22  PLANNED INTERVENTIONS: self care/ADL training, therapeutic exercise, therapeutic activity, neuromuscular re-education, balance training, functional mobility training, aquatic therapy, patient/family education, cognitive remediation/compensation, visual/perceptual remediation/compensation, and DME and/or AE instructions  RECOMMENDED OTHER SERVICES: Neuropsych  CONSULTED AND AGREED WITH PLAN OF CARE: Patient and family Midwife  PLAN  FOR NEXT SESSION: renewal completed today (in advance for next week), work towards ongoing and new goals. Pt is agreeable to coming consistently 1x/wk   Hans Eden, OT 02/20/2022, 11:35 AM    04/15/22: Pt has not returned to O.T. since above date d/t no shows - will d/c episode of care at this time. See above for remaining deficits  Redmond Baseman, OTR/L 04/15/22 9:50 AM Phone [phone] FAX [phone]

## 2022-02-20 NOTE — Patient Instructions (Signed)
Access Code: [facility] URL: [email or web address] Date: 02/20/2022 Prepared by: Elease Etienne  Exercises - Tandem Walking with Counter Support  - 1 x daily - 5 x weekly - 3 sets - 10 reps - Backward Tandem Walking with Counter Support  - 1 x daily - 5 x weekly - 3 sets - 10 reps - Forward Backward Monster Walk with Band at Thighs and Counter Support  - 1 x daily - 5 x weekly - 3 sets - 10 reps - Squat with Counter Support  - 1 x daily - 5 x weekly - 2 sets - 8 reps - Standing Tandem Balance with Counter Support  - 1 x daily - 4 x weekly - 1 sets - 4 reps - 30 seconds hold - Standing Single Leg Stance with Counter Support  - 1 x daily - 4 x weekly - 1 sets - 4 reps - 30 seconds hold

## 2022-02-20 NOTE — Therapy (Signed)
OUTPATIENT PHYSICAL THERAPY TREATMENT NOTE   Patient Name: Hayley Bryant MRN: [id number] DOB:1970-07-28, 51 y.o., female Today's Date: 02/20/2022  PCP: Barrie Lyme, FNP REFERRING PROVIDER: Cathlyn Parsons, PA-C   END OF SESSION:   PT End of Session - 02/20/22 0850     Visit Number 11    Number of Visits 25   17+8   Date for PT Re-Evaluation 53/61/44   re-certed on 08/15/4006   Authorization Type [employer]    PT Start Time 0845    PT Stop Time 0931    PT Time Calculation (min) 46 min    Equipment Utilized During Treatment Gait belt    Activity Tolerance Patient tolerated treatment well    Behavior During Therapy Flat affect             Past Medical History:  Diagnosis Date   Anxiety    Depression    Diabetes mellitus    Fibroids    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[place])    Hypertension    Stroke [facility])    Past Surgical History:  Procedure Laterality Date   CHOLECYSTECTOMY     LYMPH NODE DISSECTION     Patient Active Problem List   Diagnosis Date Noted   Acute ischemic stroke ([place]) 11/22/2021   Acute CVA (cerebrovascular accident) ([place]) 11/15/2021   Abnormal LFTs 11/15/2021   Hyperlipidemia associated with type 2 diabetes mellitus ([place]) 09/07/2017   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   Uncontrolled type 2 diabetes mellitus with hyperglycemia ([place]) 05/07/2017   Overweight (BMI 25.0-29.9) 09/20/2013   Difficulty sleeping 09/20/2013   Vitamin D deficiency 06/21/2013   Suicidal overdose ([place]) 12/06/2012   HTN (hypertension) 12/06/2012   Type 2 diabetes mellitus ([place]) 12/06/2012   Depression 07/13/2012   Anxiety 07/13/2012    REFERRING DIAG: I63.9 (ICD-10-CM) - Cerebral infarction, unspecified   THERAPY DIAG:  Hemiplegia and hemiparesis following cerebral infarction affecting right dominant side (HCC)  Muscle weakness (generalized)  Unsteadiness on feet  Other lack of coordination  Other abnormalities of gait and mobility  Rationale for Evaluation  and Treatment Rehabilitation  PERTINENT HISTORY: Presented to [name] long emergency department on 11/15/2021 with slurred speech and confusion.  CT head and CTA of the head performed without acute findings or LVO. MRI found left basal ganglia infarct.  Admitted to rehab 11/22/2021 until 12/06/2021.   PMH:  Suicidal overdose, HTN, DM2, Acute CVA, depression, Huntington's like disease, hyperlipidemia, anxiety  PRECAUTIONS: Fall  SUBJECTIVE:  Pt is fine.  Denies falls or other acute changes.  Pt accompanied by: self  PAIN:  Are you having pain? No   OBJECTIVE: (objective measures completed at initial evaluation unless otherwise dated)   TODAY'S TREATMENT:  SELF-CARE/HOME MANAGEMENT: -Discussed HEP and walking program.  Pt states she is "not walking much".  Edu on importance of consistent exercise to make gains and importance of aerobic exercise for endurance as pt is currently only occasionally working on squats and walking to do normal tasks around the house, but not trialing walking program.  She reports she has lost her green theraband and has not been doing the exercises with this.  PT provides green theraband and pt states she does not need printout of HEP.  Assessed LTGs: -Setup and walked pt through [employer].  Pt score improved to 60% from 58%. -BERGHulan Fess PT Assessment - 02/20/22 0902       Berg Balance Test   Sit to Stand Able to stand without using hands and  stabilize independently    Standing Unsupported Able to stand safely 2 minutes    Sitting with Back Unsupported but Feet Supported on Floor or Stool Able to sit safely and securely 2 minutes    Stand to Sit Sits safely with minimal use of hands    Transfers Able to transfer safely, minor use of hands    Standing Unsupported with Eyes Closed Able to stand 10 seconds safely    Standing Unsupported with Feet Together Able to place feet together independently and stand 1 minute safely    From Standing, Reach Forward  with Outstretched Arm Can reach confidently >25 cm (10")    From Standing Position, Pick up Object from Floor Able to pick up shoe safely and easily    From Standing Position, Turn to Look Behind Over each Shoulder Looks behind from both sides and weight shifts well    Turn 360 Degrees Able to turn 360 degrees safely in 4 seconds or less   while pt does not lose balance she turns to left much quicker than right, discussed safety with turning as precaution to prevent moving too quickly at home.   Standing Unsupported, Alternately Place Feet on Step/Stool Able to complete 4 steps without aid or supervision   can complete 8 in > 20 sec, but needs supervision for prolonged step taps.   Standing Unsupported, One Foot in [place] to take small step independently and hold 30 seconds   requires demo of small step   Standing on One Leg Tries to lift leg/unable to hold 3 seconds but remains standing independently    Total Score 49            -10MWT w/ RW:  12.22 sec = 0.82 m/sec OR 2.70 ft/sec -5xSTS w/ BUE:  18.38 sec -Pt ambulates w/ RW 756' supervision level, reports she could have walked another lap as she was "not tired yet"  PATIENT EDUCATION: Education details:  PT stressed importance of pt doing consistent activity out of the clinic.   Person educated: Patient and son Education method: Explanation Education comprehension: verbalized understanding and needs further education     HOME EXERCISE PROGRAM: Access Code: [email or web address] -Added SLS and tandem at counter 02/20/2022  You Can Walk For A Certain Length Of Time Each Day                          Walk 5 minutes 2 times per day.             Increase 2  minutes every 7 days              Work up to 20 minutes (1-2 times per day).               Example:                         Day 1-2           4-5 minutes     3 times per day                         Day 7-8           10-12 minutes 2-3 times per day                         Day 13-14  20-22 minutes 1-2 times per day    OLD GOALS: Goals reviewed with patient? Yes   SHORT TERM GOALS: Target date: 01/24/2022   Pt will perform initial strength and balance HEP with supervision from family. Baseline:   Established, but pt would benefit from advancements. Goal status:  MET   2.  Pt will decrease 5xSTS to </= 20 seconds w/ UE support in order to demonstrate decreased risk for falls and improved functional bilateral LE strength and power. Baseline: 24.00 sec w/ BUE; 01/27/2022 18.68 sec w/ BUE support Goal status: MET   3.  Pt will demonstrate TUG of </=13 seconds safely in order to decrease risk of falls and improve functional mobility using LRAD. Baseline: 15.60 sec w/ RW, impulsive; 01/27/2022 16.94 sec w/ RW Goal status: NOT MET   4.  Pt will demonstrate a gait speed of >/=2.0 feet/sec w/ LRAD in order to decrease risk for falls. Baseline: 1.73 ft/sec w/ RW; 01/22/2022 2.55 ft/sec Goal status: MET   5.  Pt will increase BERG balance score to >/=40/56 to demonstrate improved static balance. Baseline: 34/56; 01/27/2022 47/56 Goal status: MET   LONG TERM GOALS: Target date: 02/21/2022   Pt will be compliant to structured walking program at least 3 days per week with supervision from sons or husband to promote maintained aerobic gains. Baseline: To be established.  02/20/2022 pt is "not walking much" Goal status: NOT MET   2.  Pt will decrease 5xSTS to </=16 seconds w/ or w/o UE support in order to demonstrate decreased risk for falls and improved functional bilateral LE strength and power. Baseline: 24.00 sec w/ BUE support; 02/20/2022 18.38 sec w/ BUE Goal status: NOT MET   3.  Pt will demonstrate a gait speed of >/=2.3 feet/sec w/ LRAD in order to decrease risk for falls. Baseline: 1.73 ft/sec w/ RW; 02/20/2022 2.70 ft/sec Goal status: MET   4.  Pt will ambulate >/= 500 feet on level surfaces with LRAD and supervision level of assist to promote household and community  access. Baseline: 115' SBA/CGA w/ RW; 756' Supervision w/ RW Goal status: MET   5.  Pt will increase FOTO score to 71% in order to demonstrate subjective functional improvement. Baseline: 58%; 02/20/2022 60% Goal status: NOT MET   6.  Pt will increase BERG balance score to >/= 50/56 to demonstrate improved static balance. Baseline: 34/56; 01/27/2022 47/56; 02/20/2022 49/56 Goal status: NOT MET   NEW GOALS: Goals reviewed with patient? Yes   SHORT TERM GOALS: Target date: 03/21/2022    Pt will report improved compliance to advanced strength and balance HEP at least 3 days a week. Baseline:   Pt is not performing regularly 02/20/2022. Goal status:  INITIAL   2.  Pt will decrease 5xSTS to </= 16 seconds w/ UE support in order to demonstrate decreased risk for falls and improved functional bilateral LE strength and power. Baseline: 24.00 sec w/ BUE; 01/27/2022 18.68 sec w/ BUE support; 02/20/2022 18.38 sec w/ BUE Goal status: ONGOING   3.  Pt will demonstrate a gait speed of >/=3.0 feet/sec w/ LRAD in order to decrease risk for falls. Baseline: 1.73 ft/sec w/ RW; 01/22/2022 2.55 ft/sec; 02/20/2022 2.70 ft/sec Goal status: REVISED   4.  Pt will increase BERG balance score to >/=52/56 to demonstrate improved static balance and decreased fall risk. Baseline: 34/56; 01/27/2022 47/56; 02/20/2022 49/56 Goal status: REVISED   LONG TERM GOALS: Target date: 04/18/2022   Pt will be compliant to structured walking  program at least 3 days per week with supervision from sons or husband to promote maintained aerobic gains. Baseline: To be established.  02/20/2022 pt is "not walking much" Goal status: NOT MET   2.  Pt will demonstrate a gait speed of >/=3.3 feet/sec w/ LRAD in order to decrease risk for falls. Baseline: 1.73 ft/sec w/ RW; 02/20/2022 2.70 ft/sec Goal status: REVISED   4.  Pt will ambulate >/= 1000 feet on level and unlevel surfaces with LRAD and supervision level of assist to promote  household and community access. Baseline: 115' SBA/CGA w/ RW;  02/20/2022 756' Supervision w/ RW level only Goal status: REVISED   5.  Pt will increase FOTO score to 71% in order to demonstrate subjective functional improvement. Baseline: 58%; 02/20/2022 60% Goal status: ONGOING   6.  DGI to be assessed and LTG set as appropriate.  Baseline:  To be assessed.  Goal status:  INITIAL  ASSESSMENT:   CLINICAL IMPRESSION: LTGs assessed this session with initial focus of session on encouraging compliance to activity outside the clinic.  Pt faces some barriers to activity at home due to lacking consistent supervision due to work schedules and other health issues within the household and relying on family for transportation has impacted pt's ability to make appts.  Altered frequency to 1x/wk to better serve pt and family needs with emphasis on increased activity in the home.  Pt in agreement to this change.  Her goals, though 4 of 6 not met, were all progressing towards goal level.  Her FOTO score improved to 60% from 58%.  She scored a 49/56 on the BERG and ambulates w/ a speed of 2.70 ft/sec both significantly improved from eval.  She continues to require BUE during 5xSTS to pace activity safely, but she continues to trim down her time each assessment.  Her ambulatory tolerance has improved to >700' w/ RW this session limited to level surface, but we will continue to address unlevel surfaces in coming sessions.  Pt to be re-certed in order to address remaining deficits per skilled PT POC.   OBJECTIVE IMPAIRMENTS Abnormal gait, decreased activity tolerance, decreased balance, decreased cognition, decreased coordination, decreased endurance, decreased knowledge of condition, decreased knowledge of use of DME, decreased mobility, difficulty walking, decreased strength, decreased safety awareness, impaired UE functional use, improper body mechanics, and postural dysfunction.    ACTIVITY LIMITATIONS carrying,  lifting, bending, sitting, standing, squatting, stairs, transfers, bed mobility, bathing, dressing, self feeding, hygiene/grooming, and locomotion level   PARTICIPATION LIMITATIONS: meal prep, cleaning, laundry, medication management, personal finances, interpersonal relationship, driving, shopping, community activity, and occupation   PERSONAL FACTORS Age, Behavior pattern, Fitness, Past/current experiences, Time since onset of injury/illness/exacerbation, Transportation, and 3+ comorbidities: HTN, DM2, acute CVA, depression, Huntington's like disease for ~7 years  are also affecting patient's functional outcome.    REHAB POTENTIAL: Fair See personal factors and PMH   CLINICAL DECISION MAKING: Evolving/moderate complexity   EVALUATION COMPLEXITY: Moderate   PLAN: PT FREQUENCY: 1x/week   PT DURATION: 8 weeks   PLANNED INTERVENTIONS: Therapeutic exercises, Therapeutic activity, Neuromuscular re-education, Balance training, Gait training, Patient/Family education, Self Care, Stair training, Vestibular training, DME instructions, Manual therapy, and Re-evaluation   PLAN FOR NEXT SESSION: Delete old goals.  Assess DGI and set LTG if appropriate.  Modify HEP prn for posture in standing/LE strength/dynamic balance-advance from current HEP, SciFit for endurance/strengthening, hip/quad strength, coordination tasks, step ups w/o UE support >6", tilt board anteriorly oriented, tall hurdles, dot drills, obstacle course  Bary Richard, PT, DPT 02/20/2022, 10:10 AM

## 2022-02-20 NOTE — Patient Instructions (Signed)
Great job getting a pill box!  On Saturday, you are going to sort your meds into your pill box.  Put all pill bottles on the left of the box.  Open 1 bottle, put 1 pill in each day either AM or PM  Close bottle and move to right, this tells you you're done with that one so you don't double up.  Continue on through all your pill bottles.  After, double check yourself-   Do I have 5 in my AM slots  Do I have 2 in my PM slots  Close it up and start using Sunday morning

## 2022-02-20 NOTE — Therapy (Signed)
OUTPATIENT SPEECH LANGUAGE PATHOLOGY TREATMENT NOTE (RECERTIFICATION)   Patient Name: Hayley Bryant MRN: [id number] DOB:[birth year], 51 y.o., female Today's Date: 02/20/2022  PCP: Barrie Lyme  REFERRING PROVIDER: Cathlyn Parsons, PA-C   END OF SESSION:   End of Session - 02/20/22 0924     Visit Number 10    Number of Visits 17    Date for SLP Re-Evaluation 03/28/22   +6 weeks for recert   Authorization Type [employer]    SLP Start Time 6    SLP Stop Time  1100    SLP Time Calculation (min) 45 min    Activity Tolerance Patient tolerated treatment well                Past Medical History:  Diagnosis Date   Anxiety    Depression    Diabetes mellitus    Fibroids    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[place])    Hypertension    Stroke [facility])    Past Surgical History:  Procedure Laterality Date   CHOLECYSTECTOMY     LYMPH NODE DISSECTION     Patient Active Problem List   Diagnosis Date Noted   Acute ischemic stroke ([place]) 11/22/2021   Acute CVA (cerebrovascular accident) ([place]) 11/15/2021   Abnormal LFTs 11/15/2021   Hyperlipidemia associated with type 2 diabetes mellitus ([place]) 09/07/2017   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   Uncontrolled type 2 diabetes mellitus with hyperglycemia ([place]) 05/07/2017   Overweight (BMI 25.0-29.9) 09/20/2013   Difficulty sleeping 09/20/2013   Vitamin D deficiency 06/21/2013   Suicidal overdose ([place]) 12/06/2012   HTN (hypertension) 12/06/2012   Type 2 diabetes mellitus ([place]) 12/06/2012   Depression 07/13/2012   Anxiety 07/13/2012    ONSET DATE:  12/04/2021  REFERRING DIAG: I63.9 (ICD-10-CM) - Cerebral infarction, unspecified   THERAPY DIAG:  Dysarthria and anarthria  Cognitive communication deficit  Aphasia  Verbal apraxia  Rationale for Evaluation and Treatment Rehabilitation  SUBJECTIVE: "I did" re: obtaining a med box  PAIN:  Are you having pain? No  OBJECTIVE:  TODAY'S TREATMENT:  02-20-22: Pt with suboptimal  volume and speech clarity upon entering. However, pt and son endorse improved communication at home with less instances of required repetition. Target increasing volume through increased effort and improved speech clarity through modulating speaking rate. Pt demonstrates skills in reading of 3-5 word functional phrases, averaging 69 dB with usual verbal-A for both pacing and increased volume. Target word finding in picture description task. Consistent cues required for use of intentional circumlocution to aid in anomia repair, x3 occurences. Verbal instruction and modeling given for use of stop, think, then speak. Following pt demonstrates improved ability to relay thoughts regarding given photo. ID x5 items in 3 photos, generates caption for photo in 3/5 trials with max-A. Pt reporting that did obtain pillbox, will plan to fill Saturday for use in upcoming week with son to aid in recall of task. Generated strategies to assist with successful sorting of medications. Education given for recommendation that pt be supervised and checked for initial attempt. Updated POC with input from pt and son. SLP educated need for improved attendance and home practice to aid in rehabiliation success. Both verbalize understanding.     PATIENT EDUCATION: Education details: see above Person educated: Patient and Child(ren) Education method: Customer service manager Education comprehension: verbalized understanding, returned demonstration, and needs further education     GOALS: Goals reviewed with patient? Yes   SHORT TERM GOALS: Target date: 01/16/2022  Pt will ID and attempt to correct speech errors (apraxic errors/dysnomia) with 80% accuracy on structured tasks given occasional mod A over sessions  Baseline: 01-10-22 Goal status: Achieved   2.  Pt will implement dysarthria compensations to be 90+% intelligible in 5-10 minute conversations given occasional mod A over 2 sessions Baseline: 01-10-22 Goal  status: Achieved   3.   Pt will utilize word retrieval strategies on structured speech tasks with 80% accuracy given occasional mod A over 2 sessions  Baseline: 01-07-22 Goal status: Achieved   4.  Pt will use cognitive compensations to aid recall of functional information given occasional mod A over 2 sessions  Baseline:  Goal status: Not met   5.  Family will provide appropriate cues to support cognitive communication skills as demonstrated in [place] sessions given occasional min A over 2 sessions Baseline: 01-07-22 Goal status: Not met       LONG TERM GOALS: Target date: 02/14/2022; 03/28/2022 (date updated at recert)    Pt will ID and attempt to correct speech errors (apraxic errors/dysnomia) with 90% accuracy in conversation given occasional min A over 2 sessions  Baseline: 02-20-22 Goal status: met   2.  Pt will use trained dysarthria compensations to be 95+% intelligible in 15+ minute conversations given occasional min A over 2 sessions Baseline: 02-20-22 Goal status: ongoing   3.  Pt will utilize word retrieval strategies to optimize ability to functionally communicate in personally relevant conversations given occasional min A over 2 sessions Baseline:  Goal status: ongoing   4.  Pt will use cognitive compensations to aid recall of functional information given occasional min A over 2 sessions Baseline:  Goal status: ongoing   5.  Pt will report improved communication effectiveness via PROM by 2 points at last ST session  Baseline: CPIB=15 Goal status: ongoing     ASSESSMENT:   CLINICAL IMPRESSION: Patient is a 51 y.o. female who was seen today for CVA in June 2023. PMHX significant for anxiety, depression, and Huntington like disease.Overall improved intelligibility to 90-95% in conversation in quiet environment. Word finding has improved as well, and in structured tasks she can use verbal compensations for word finding - ongoing training for carryover of compensations in  conversation. Memory and attention continue to be moderately impaired. Family needs to attend some sessions to [facility] [place] in carryover of HEP and strategies to support her cognition and intelligibility. Pt would benefit from skilled ST intervention to address speech, language, and cognitive changes to optimize communication effectiveness and functional independence.    OBJECTIVE IMPAIRMENTS include memory, aphasia, apraxia, and dysarthria. These impairments are limiting patient from household responsibilities and effectively communicating at home and in community. Factors affecting potential to achieve goals and functional outcome are medical prognosis and previous level of function. Patient will benefit from skilled SLP services to address above impairments and improve overall function.   REHAB POTENTIAL: Good   PLAN: SLP FREQUENCY: 2x/week   SLP DURATION: 8 weeks   PLANNED INTERVENTIONS: Language facilitation, Environmental controls, Cueing hierachy, Cognitive reorganization, Internal/external aids, Functional tasks, Multimodal communication approach, SLP instruction and feedback, Compensatory    Su Monks, CCC-SLP 02/20/2022, 11:19 AM

## 2022-02-24 ENCOUNTER — Ambulatory Visit: Payer: Managed Care, Other (non HMO) | Admitting: Speech Pathology

## 2022-02-24 NOTE — Therapy (Deleted)
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[place])    Hypertension    Stroke [facility])    Past Surgical History:  Procedure Laterality Date   CHOLECYSTECTOMY     LYMPH NODE DISSECTION     Patient Active Problem List   Diagnosis Date Noted   Acute ischemic stroke ([place]) 11/22/2021   Acute CVA (cerebrovascular accident) ([place]) 11/15/2021   Abnormal LFTs 11/15/2021   Hyperlipidemia associated with type 2 diabetes mellitus ([place]) 09/07/2017   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  Rationale for Evaluation and Treatment Rehabilitation  SUBJECTIVE: ***  PAIN:  Are you having pain? No  OBJECTIVE:  TODAY'S TREATMENT:  02-24-22: ***   02-20-22: Pt with suboptimal volume and speech clarity upon entering. However, pt and son endorse improved communication at home with less instances of required repetition. Target increasing volume through increased effort and improved speech clarity through modulating speaking rate. Pt demonstrates skills in reading of 3-5 word functional phrases, averaging 69 dB with usual verbal-A for both pacing and increased volume. Target word finding in  picture description task. Consistent cues required for use of intentional circumlocution to aid in anomia repair, x3 occurences. Verbal instruction and modeling given for use of stop, think, then speak. Following pt demonstrates improved ability to relay thoughts regarding given photo. ID x5 items in 3 photos, generates caption for photo in 3/5 trials with max-A. Pt reporting that did obtain pillbox, will plan to fill Saturday for use in upcoming week with son to aid in recall of task. Generated strategies to assist with successful sorting of medications. Education given for recommendation that pt be supervised and checked for initial attempt. Updated POC with input from pt and son. SLP educated need for improved attendance and home practice to aid in rehabiliation success. Both verbalize understanding.     PATIENT EDUCATION: Education details: see above Person educated: Patient and Child(ren) Education method: Customer service manager Education comprehension: verbalized understanding, returned demonstration, and needs further education     GOALS: Goals reviewed with patient? Yes   SHORT TERM GOALS: Target date: 01/16/2022     Pt will ID and attempt to correct speech errors (apraxic errors/dysnomia) with 80% accuracy on structured tasks given occasional mod A over sessions  Baseline: 01-10-22 Goal status: Achieved   2.  Pt will implement dysarthria compensations to be 90+% intelligible in 5-10 minute conversations given occasional mod A over 2 sessions Baseline: 01-10-22 Goal status: Achieved   3.   Pt will utilize word retrieval strategies on structured speech tasks with 80% accuracy given occasional mod A over 2 sessions  Baseline: 01-07-22 Goal status: Achieved   4.  Pt will use cognitive compensations to aid recall of functional information given occasional mod A over 2 sessions  Baseline:  Goal status: Not met   5.  Family will provide appropriate cues to support cognitive  communication skills as demonstrated in [place] sessions given occasional min A over 2 sessions Baseline: 01-07-22 Goal status: Not met       LONG TERM GOALS: Target date: 02/14/2022; 03/28/2022 (date updated at recert)    Pt will ID and attempt to correct speech errors (apraxic errors/dysnomia) with 90% accuracy in conversation given occasional min A over 2 sessions  Baseline: 02-20-22 Goal status: met   2.  Pt will use trained dysarthria compensations to be 95+% intelligible in 15+ minute conversations given occasional min A over 2 sessions Baseline: 02-20-22 Goal status: ongoing   3.  Pt will utilize word retrieval strategies to optimize ability to functionally communicate in personally relevant conversations given occasional min A over 2 sessions Baseline:  Goal status: ongoing   4.  Pt will use cognitive compensations to aid recall of functional information given occasional min A over 2 sessions Baseline:  Goal status: ongoing   5.  Pt will report improved communication effectiveness via PROM by 2 points at last ST session  Baseline: CPIB=15 Goal status: ongoing     ASSESSMENT:   CLINICAL IMPRESSION: Patient is a 51 y.o. female who was seen today for CVA in June 2023. PMHX significant for anxiety, depression, and Huntington like disease.Overall improved intelligibility to 90-95% in conversation in quiet environment. Word finding has improved as well, and in structured tasks she can use verbal compensations for word finding - ongoing training for carryover of compensations in conversation. Memory and attention continue to be moderately impaired. Family needs to attend some sessions to A Hayley Bryant in carryover of HEP and strategies to support her cognition and intelligibility. Pt would benefit from skilled ST intervention to address speech, language, and cognitive changes to optimize communication effectiveness and functional independence.    OBJECTIVE IMPAIRMENTS include memory, aphasia,  apraxia, and dysarthria. These impairments are limiting patient from household responsibilities and effectively communicating at home and in community. Factors affecting potential to achieve goals and functional outcome are medical prognosis and previous level of function. Patient will benefit from skilled SLP services to address above impairments and improve overall function.   REHAB POTENTIAL: Good   PLAN: SLP FREQUENCY: 2x/week   SLP DURATION: 8 weeks   PLANNED INTERVENTIONS: Language facilitation, Environmental controls, Cueing hierachy, Cognitive reorganization, Internal/external aids, Functional tasks, Multimodal communication approach, SLP instruction and feedback, Compensatory    Su Monks, CCC-SLP 02/24/2022, 8:04 AM

## 2022-03-03 NOTE — Therapy (Deleted)
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[place])    Hypertension    Stroke [facility])    Past Surgical History:  Procedure Laterality Date   CHOLECYSTECTOMY     LYMPH NODE DISSECTION     Patient Active Problem List   Diagnosis Date Noted   Acute ischemic stroke ([place]) 11/22/2021   Acute CVA (cerebrovascular accident) ([place]) 11/15/2021   Abnormal LFTs 11/15/2021   Hyperlipidemia associated with type 2 diabetes mellitus ([place]) 09/07/2017   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  Rationale for Evaluation and Treatment Rehabilitation  SUBJECTIVE: ***  PAIN:  Are you having pain? No  OBJECTIVE:  TODAY'S TREATMENT:  03-03-22: ***   02-20-22: Pt with suboptimal volume and speech clarity upon entering. However, pt and son endorse improved communication at home with less instances of required repetition. Target increasing volume through increased effort and improved speech clarity through modulating speaking rate. Pt demonstrates skills in reading of 3-5 word functional phrases, averaging 69 dB with usual verbal-A for both pacing and increased volume. Target word finding in  picture description task. Consistent cues required for use of intentional circumlocution to aid in anomia repair, x3 occurences. Verbal instruction and modeling given for use of stop, think, then speak. Following pt demonstrates improved ability to relay thoughts regarding given photo. ID x5 items in 3 photos, generates caption for photo in 3/5 trials with max-A. Pt reporting that did obtain pillbox, will plan to fill Saturday for use in upcoming week with son to aid in recall of task. Generated strategies to assist with successful sorting of medications. Education given for recommendation that pt be supervised and checked for initial attempt. Updated POC with input from pt and son. SLP educated need for improved attendance and home practice to aid in rehabiliation success. Both verbalize understanding.     PATIENT EDUCATION: Education details: see above Person educated: Patient and Child(ren) Education method: Customer service manager Education comprehension: verbalized understanding, returned demonstration, and needs further education     GOALS: Goals reviewed with patient? Yes   SHORT TERM GOALS: Target date: 01/16/2022     Pt will ID and attempt to correct speech errors (apraxic errors/dysnomia) with 80% accuracy on structured tasks given occasional mod A over sessions  Baseline: 01-10-22 Goal status: Achieved   2.  Pt will implement dysarthria compensations to be 90+% intelligible in 5-10 minute conversations given occasional mod A over 2 sessions Baseline: 01-10-22 Goal status: Achieved   3.   Pt will utilize word retrieval strategies on structured speech tasks with 80% accuracy given occasional mod A over 2 sessions  Baseline: 01-07-22 Goal status: Achieved   4.  Pt will use cognitive compensations to aid recall of functional information given occasional mod A over 2 sessions  Baseline:  Goal status: Not met   5.  Family will provide appropriate cues to support cognitive  communication skills as demonstrated in [place] sessions given occasional min A over 2 sessions Baseline: 01-07-22 Goal status: Not met       LONG TERM GOALS: Target date: 02/14/2022; 03/28/2022 (date updated at recert)    Pt will ID and attempt to correct speech errors (apraxic errors/dysnomia) with 90% accuracy in conversation given occasional min A over 2 sessions  Baseline: 02-20-22 Goal status: met   2.  Pt will use trained dysarthria compensations to be 95+% intelligible in 15+ minute conversations given occasional min A over 2 sessions Baseline: 02-20-22 Goal status: ongoing   3.  Pt will utilize word retrieval strategies to optimize ability to functionally communicate in personally relevant conversations given occasional min A over 2 sessions Baseline:  Goal status: ongoing   4.  Pt will use cognitive compensations to aid recall of functional information given occasional min A over 2 sessions Baseline:  Goal status: ongoing   5.  Pt will report improved communication effectiveness via PROM by 2 points at last ST session  Baseline: CPIB=15 Goal status: ongoing     ASSESSMENT:   CLINICAL IMPRESSION: Patient is a 51 y.o. female who was seen today for CVA in June 2023. PMHX significant for anxiety, depression, and Huntington like disease.Overall improved intelligibility to 90-95% in conversation in quiet environment. Word finding has improved as well, and in structured tasks she can use verbal compensations for word finding - ongoing training for carryover of compensations in conversation. Memory and attention continue to be moderately impaired. Family needs to attend some sessions to A [place] in carryover of HEP and strategies to support her cognition and intelligibility. Pt would benefit from skilled ST intervention to address speech, language, and cognitive changes to optimize communication effectiveness and functional independence.    OBJECTIVE IMPAIRMENTS include memory, aphasia,  apraxia, and dysarthria. These impairments are limiting patient from household responsibilities and effectively communicating at home and in community. Factors affecting potential to achieve goals and functional outcome are medical prognosis and previous level of function. Patient will benefit from skilled SLP services to address above impairments and improve overall function.   REHAB POTENTIAL: Good   PLAN: SLP FREQUENCY: 2x/week   SLP DURATION: 8 weeks   PLANNED INTERVENTIONS: Language facilitation, Environmental controls, Cueing hierachy, Cognitive reorganization, Internal/external aids, Functional tasks, Multimodal communication approach, SLP instruction and feedback, Compensatory    Su Monks, CCC-SLP 03/03/2022, 3:01 PM

## 2022-03-04 ENCOUNTER — Telehealth: Payer: Self-pay | Admitting: Physical Therapy

## 2022-03-04 ENCOUNTER — Ambulatory Visit: Payer: Managed Care, Other (non HMO) | Admitting: Physical Therapy

## 2022-03-04 ENCOUNTER — Encounter: Payer: Self-pay | Admitting: Speech Pathology

## 2022-03-04 ENCOUNTER — Ambulatory Visit: Payer: Managed Care, Other (non HMO) | Admitting: Speech Pathology

## 2022-03-04 NOTE — Telephone Encounter (Signed)
PT spoke to pt's husband on the phone to explain the no show/cancel policy and canceling remaining PT/ST appts as PT and ST have spoken to patient and family about compliance to POC and scheduled appts before.  Pt does not have access to a working car at this point in time so recommended the husband speak to their primary care or referring provider to seek home health services if interested in the future.  Will cancel remaining PT/ST appts.  Elease Etienne, PT, DPT

## 2022-03-04 NOTE — Therapy (Signed)
[facility] [address] [place], [place], [address] Phone: [phone]   Fax:  [phone]  Patient Details  Name: Hayley Bryant MRN: [id number] Date of Birth: 10-30-1970 Referring Provider:  No ref. provider found  Encounter Date: 03/04/2022  SPEECH THERAPY DISCHARGE SUMMARY  Visits from Start of Care: 10  Current functional level related to goals / functional outcomes: Unable to assess d/t pt not returning for therapy.    Remaining deficits: Impairments in cognition, Aphasia, Apraxia, Dysarthria   Education / Equipment: Strategies and compensations for dysarthria, anomia, processing; external cognitive aids; HEP  Patient agrees to discharge. Patient goals were not met. Patient is being discharged due to not returning since the last visit. At prior ST visit, SLP provided education on attendance policy and need to attend scheduled appointment to maximize therapeutic efficacy. Per husband, family without working car at this time, thus pt unable to attend therapy appointments. SLP to d/c d/t frequent no-show appointments. Should pt desire to return for further ST services, a new referral is required.    SHORT TERM GOALS: Target date: 01/16/2022     Pt will ID and attempt to correct speech errors (apraxic errors/dysnomia) with 80% accuracy on structured tasks given occasional mod A over sessions  Baseline: 01-10-22 Goal status: Achieved   2.  Pt will implement dysarthria compensations to be 90+% intelligible in 5-10 minute conversations given occasional mod A over 2 sessions Baseline: 01-10-22 Goal status: Achieved   3.   Pt will utilize word retrieval strategies on structured speech tasks with 80% accuracy given occasional mod A over 2 sessions  Baseline: 01-07-22 Goal status: Achieved   4.  Pt will use cognitive compensations to aid recall of functional information given occasional mod A over 2 sessions  Baseline:   Goal status: Not met   5.  Family will provide appropriate cues to support cognitive communication skills as demonstrated in [place] sessions given occasional min A over 2 sessions Baseline: 01-07-22 Goal status: Not met       LONG TERM GOALS: Target date: 02/14/2022; 03/28/2022 (date updated at recert)    Pt will ID and attempt to correct speech errors (apraxic errors/dysnomia) with 90% accuracy in conversation given occasional min A over 2 sessions  Baseline: 02-20-22 Goal status: met   2.  Pt will use trained dysarthria compensations to be 95+% intelligible in 15+ minute conversations given occasional min A over 2 sessions Baseline: 02-20-22 Goal status: not met   3.  Pt will utilize word retrieval strategies to optimize ability to functionally communicate in personally relevant conversations given occasional min A over 2 sessions Baseline:  Goal status: not met   4.  Pt will use cognitive compensations to aid recall of functional information given occasional min A over 2 sessions Baseline:  Goal status: not met   5.  Pt will report improved communication effectiveness via PROM by 2 points at last ST session  Baseline: CPIB=15 Goal status: not met  Su Monks, CCC-SLP 03/04/2022, 11:54 AM  [facility] [address] [place], [place], [address] Phone: [phone]   Fax:  [phone]

## 2022-03-11 ENCOUNTER — Ambulatory Visit: Payer: Managed Care, Other (non HMO) | Admitting: Physical Therapy

## 2022-03-11 ENCOUNTER — Ambulatory Visit: Payer: Managed Care, Other (non HMO)

## 2022-03-19 ENCOUNTER — Encounter: Payer: Managed Care, Other (non HMO) | Admitting: Speech Pathology

## 2022-03-19 ENCOUNTER — Ambulatory Visit: Payer: Managed Care, Other (non HMO) | Admitting: Physical Therapy

## 2022-03-24 ENCOUNTER — Ambulatory Visit: Payer: Managed Care, Other (non HMO) | Admitting: Physical Therapy

## 2022-03-24 ENCOUNTER — Encounter: Payer: Managed Care, Other (non HMO) | Admitting: Speech Pathology

## 2022-03-31 ENCOUNTER — Encounter: Payer: Managed Care, Other (non HMO) | Admitting: Speech Pathology

## 2022-03-31 ENCOUNTER — Ambulatory Visit: Payer: Managed Care, Other (non HMO) | Admitting: Physical Therapy

## 2022-04-04 ENCOUNTER — Encounter: Payer: Managed Care, Other (non HMO) | Admitting: Physical Medicine & Rehabilitation

## 2022-04-08 ENCOUNTER — Ambulatory Visit: Payer: Managed Care, Other (non HMO) | Attending: Physician Assistant | Admitting: Occupational Therapy

## 2022-04-08 ENCOUNTER — Ambulatory Visit: Payer: Managed Care, Other (non HMO) | Admitting: Physical Therapy

## 2022-04-15 ENCOUNTER — Ambulatory Visit: Payer: Managed Care, Other (non HMO) | Admitting: Physical Therapy

## 2022-04-15 ENCOUNTER — Ambulatory Visit: Payer: Managed Care, Other (non HMO) | Admitting: Occupational Therapy

## 2022-04-22 ENCOUNTER — Ambulatory Visit: Payer: Managed Care, Other (non HMO) | Admitting: Occupational Therapy

## 2022-04-29 ENCOUNTER — Encounter: Payer: Managed Care, Other (non HMO) | Admitting: Occupational Therapy

## 2022-05-06 ENCOUNTER — Encounter: Payer: Managed Care, Other (non HMO) | Admitting: Occupational Therapy

## 2022-05-13 ENCOUNTER — Encounter: Payer: Managed Care, Other (non HMO) | Admitting: Occupational Therapy

## 2022-05-20 ENCOUNTER — Encounter: Payer: Managed Care, Other (non HMO) | Admitting: Occupational Therapy

## 2022-05-27 ENCOUNTER — Emergency Department (HOSPITAL_COMMUNITY): Payer: Commercial Managed Care - HMO

## 2022-05-27 ENCOUNTER — Encounter (HOSPITAL_COMMUNITY): Payer: Self-pay | Admitting: Emergency Medicine

## 2022-05-27 ENCOUNTER — Emergency Department (HOSPITAL_COMMUNITY)
Admission: EM | Admit: 2022-05-27 | Discharge: 2022-05-27 | Disposition: A | Discharge status: Home / Self Care | Payer: Commercial Managed Care - HMO | Attending: Emergency Medicine | Admitting: Emergency Medicine

## 2022-05-27 DIAGNOSIS — Z8673 Personal history of transient ischemic attack (TIA), and cerebral infarction without residual deficits: Secondary | ICD-10-CM | POA: Diagnosis not present

## 2022-05-27 DIAGNOSIS — Z79899 Other long term (current) drug therapy: Secondary | ICD-10-CM | POA: Diagnosis not present

## 2022-05-27 DIAGNOSIS — S92514A Nondisplaced fracture of proximal phalanx of right lesser toe(s), initial encounter for closed fracture: Secondary | ICD-10-CM | POA: Diagnosis not present

## 2022-05-27 DIAGNOSIS — M542 Cervicalgia: Secondary | ICD-10-CM | POA: Diagnosis present

## 2022-05-27 DIAGNOSIS — Y9339 Activity, other involving climbing, rappelling and jumping off: Secondary | ICD-10-CM | POA: Insufficient documentation

## 2022-05-27 DIAGNOSIS — Z7982 Long term (current) use of aspirin: Secondary | ICD-10-CM | POA: Insufficient documentation

## 2022-05-27 DIAGNOSIS — E119 Type 2 diabetes mellitus without complications: Secondary | ICD-10-CM | POA: Diagnosis not present

## 2022-05-27 DIAGNOSIS — W109XXA Fall (on) (from) unspecified stairs and steps, initial encounter: Secondary | ICD-10-CM | POA: Diagnosis not present

## 2022-05-27 DIAGNOSIS — Z794 Long term (current) use of insulin: Secondary | ICD-10-CM | POA: Insufficient documentation

## 2022-05-27 DIAGNOSIS — I10 Essential (primary) hypertension: Secondary | ICD-10-CM | POA: Diagnosis not present

## 2022-05-27 DIAGNOSIS — S12191A Other nondisplaced fracture of second cervical vertebra, initial encounter for closed fracture: Secondary | ICD-10-CM | POA: Diagnosis not present

## 2022-05-27 MED ORDER — ACETAMINOPHEN 500 MG PO TABS
1000.0000 mg | ORAL_TABLET | Freq: Once | ORAL | Status: AC
  Administered 2022-05-27: 1000 mg via ORAL
  Filled 2022-05-27: qty 2

## 2022-05-27 NOTE — ED Provider Triage Note (Signed)
Emergency Medicine Provider Triage Evaluation Note  Hayley Bryant , a 51 y.o. female  was evaluated in triage.  Pt complains of head, neck, and toe pain secondary to a fall.  Patient reports walking upstairs yesterday when she slipped, falling backwards and hitting her head.  She complains of a headache, bilateral neck pain with midline tenderness, and right fifth toe pain.  She denies losing consciousness and denies blood thinner usage  Review of Systems  Positive: As above Negative: As above  Physical Exam  BP (!) 146/106 (BP Location: Right Arm)   Pulse (!) 106   Temp 99.1 F (37.3 C)   Resp 19   Ht '5\' 7"'$  (1.702 m)   Wt 68 kg   SpO2 99%   BMI 23.48 kg/m  Gen:   Awake, no distress   Resp:  Normal effort  MSK:   Moves extremities without difficulty  Other:    Medical Decision Making  Medically screening exam initiated at 12:26 PM.  Appropriate orders placed.  AMANTHA SKLAR was informed that the remainder of the evaluation will be completed by another provider, this initial triage assessment does not replace that evaluation, and the importance of remaining in the ED until their evaluation is complete.     Dorothyann Peng, PA-C 05/27/22 1229

## 2022-05-27 NOTE — ED Provider Notes (Signed)
[facility] EMERGENCY DEPARTMENT Provider Note  CSN: [id number] Arrival date & time: 05/27/22 1213  Chief Complaint(s) Fall  HPI Hayley Bryant is a 51 y.o. female with history of prior stroke, Huntington's disease presenting to the emergency department with fall.  Patient was climbing up the stairs yesterday when she slipped and fell backwards, reports hitting her head.  She reports she primarily has pain in the neck and the right foot.  Denies numbness or tingling, weakness.  No vision changes, speech changes, chest or abdominal pain, other pain in her arms or legs.  This happened last night.  Denies blood thinner.   Past Medical History Past Medical History:  Diagnosis Date   Anxiety    Depression    Diabetes mellitus    Fibroids    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[place])    Hypertension    Stroke [facility])    Patient Active Problem List   Diagnosis Date Noted   Acute ischemic stroke ([place]) 11/22/2021   Acute CVA (cerebrovascular accident) ([place]) 11/15/2021   Abnormal LFTs 11/15/2021   Hyperlipidemia associated with type 2 diabetes mellitus ([place]) 09/07/2017   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[place]) 05/12/2017   Uncontrolled type 2 diabetes mellitus with hyperglycemia ([place]) 05/07/2017   Overweight (BMI 25.0-29.9) 09/20/2013   Difficulty sleeping 09/20/2013   Vitamin D deficiency 06/21/2013   Suicidal overdose ([place]) 12/06/2012   HTN (hypertension) 12/06/2012   Type 2 diabetes mellitus ([place]) 12/06/2012   Depression 07/13/2012   Anxiety 07/13/2012   Home Medication(s) Prior to Admission medications   Medication Sig Start Date End Date Taking? Authorizing Provider  acetaminophen (TYLENOL) 325 MG tablet Take 1-2 tablets (325-650 mg total) by mouth every 4 (four) hours as needed for mild pain. 12/06/21   Setzer, Edman Circle, PA-C  aspirin EC 81 MG tablet Take 1 tablet (81 mg total) by mouth daily. Swallow whole. 12/07/21   Setzer, Edman Circle, PA-C  atorvastatin (LIPITOR) 40 MG tablet Take 1  tablet (40 mg total) by mouth at bedtime. 01/21/22   Melvenia Beam, MD  blood glucose meter kit and supplies KIT Dispense based on patient and insurance preference. Use up to four times daily as directed. 09/16/21   Prosperi, Christian H, PA-C  diclofenac Sodium (VOLTAREN) 1 % GEL Apply 2 g topically 4 (four) times daily. 12/06/21   Setzer, Edman Circle, PA-C  FLUoxetine (PROZAC) 20 MG capsule Take 3 capsules (60 mg total) by mouth daily. 12/06/21   Setzer, Edman Circle, PA-C  Insulin Glargine (BASAGLAR KWIKPEN) 100 UNIT/ML Inject 15 Units into the skin daily. 12/06/21   Setzer, Edman Circle, PA-C  lisinopril-hydrochlorothiazide (ZESTORETIC) 20-25 MG tablet Take 1 tablet by mouth daily. 12/06/21   Setzer, Edman Circle, PA-C  pantoprazole (PROTONIX) 40 MG tablet Take 1 tablet (40 mg total) by mouth daily for 14 days. 12/30/21 01/13/22  Wynona Dove A, DO  potassium chloride SA (KLOR-CON M) 20 MEQ tablet Take 1 tablet (20 mEq total) by mouth 2 (two) times daily for 3 days. 12/30/21 01/02/22  Jeanell Sparrow, DO  sucralfate (CARAFATE) 1 g tablet Take 1 tablet (1 g total) by mouth 4 (four) times daily -  with meals and at bedtime for 7 days. 12/30/21 01/06/22  Jeanell Sparrow, DO  Tirzepatide [facility]) Inject into the skin.    [provider]  traZODone (DESYREL) 50 MG tablet Take 0.5-1 tablets (25-50 mg total) by mouth at bedtime as needed for sleep. 01/21/22   Melvenia Beam, MD  Past Surgical History Past Surgical History:  Procedure Laterality Date   CHOLECYSTECTOMY     LYMPH NODE DISSECTION     Family History Family History  Problem Relation Age of Onset   Hypertension Mother    Heart attack Mother    Huntington's disease Mother    Huntington's disease Maternal Grandmother    Stroke Neg Hx     Social History Social History   Tobacco Use   Smoking status: Never   Smokeless  tobacco: Never  Vaping Use   Vaping Use: Never used  Substance Use Topics   Alcohol use: No   Drug use: No   Allergies Metformin and related  Review of Systems Review of Systems  All other systems reviewed and are negative.   Physical Exam Vital Signs  I have reviewed the triage vital signs BP (!) 146/106 (BP Location: Right Arm)   Pulse (!) 106   Temp 99.1 F (37.3 C)   Resp 19   Ht _0  (1.702 m)   Wt 68 kg   SpO2 99%   BMI 23.48 kg/m  Physical Exam Vitals and nursing note reviewed.  Constitutional:      General: She is not in acute distress.    Appearance: She is well-developed.  HENT:     Head: Normocephalic and atraumatic.     Mouth/Throat:     Mouth: Mucous membranes are moist.  Eyes:     Pupils: Pupils are equal, round, and reactive to light.  Cardiovascular:     Rate and Rhythm: Normal rate and regular rhythm.     Heart sounds: No murmur heard. Pulmonary:     Effort: Pulmonary effort is normal. No respiratory distress.     Breath sounds: Normal breath sounds.  Abdominal:     General: Abdomen is flat.     Palpations: Abdomen is soft.     Tenderness: There is no abdominal tenderness.  Musculoskeletal:        General: No tenderness.     Right lower leg: No edema.     Left lower leg: No edema.     Comments: Mild midline cervical spine tenderness without step-off.  No midline  T, L-spine tenderness.  No chest wall tenderness or crepitus.  Full painless range of motion at the bilateral upper extremities including the shoulders, elbows, hand and fingers, and in the bilateral lower extremities including the hips, knees, ankle, toes.  Focal tenderness to the right wrist and to the right fourth and fifth toes, no midfoot tenderness no wounds  Skin:    General: Skin is warm and dry.  Neurological:     General: No focal deficit present.     Mental Status: She is alert. Mental status is at baseline.  Psychiatric:        Mood and Affect: Mood normal.         Behavior: Behavior normal.     ED Results and Treatments Labs (all labs ordered are listed, but only abnormal results are displayed) Labs Reviewed - No data to display  Radiology DG Wrist Complete Right  Result Date: 05/27/2022 CLINICAL DATA:  [place] while walking up the stairs last night EXAM: RIGHT WRIST - COMPLETE 3+ VIEW COMPARISON:  None Available. FINDINGS: Osseous mineralization normal. Joint spaces preserved. No acute fracture, dislocation, or bone destruction. IMPRESSION: No acute osseous abnormalities. Electronically Signed   By: Lavonia Dana M.D.   On: 05/27/2022 16:02   CT Head Wo Contrast  Result Date: 05/27/2022 CLINICAL DATA:  Neck trauma with midline tenderness.  Neck pain EXAM: CT HEAD WITHOUT CONTRAST CT CERVICAL SPINE WITHOUT CONTRAST TECHNIQUE: Multidetector CT imaging of the head and cervical spine was performed following the standard protocol without intravenous contrast. Multiplanar CT image reconstructions of the cervical spine were also generated. RADIATION DOSE REDUCTION: This exam was performed according to the departmental dose-optimization program which includes automated exposure control, adjustment of the mA and/or kV according to patient size and/or use of iterative reconstruction technique. COMPARISON:  10/10/2016 brain MRI FINDINGS: CT HEAD FINDINGS Brain: No evidence of acute infarction, hemorrhage, hydrocephalus, extra-axial collection or mass lesion/mass effect. Generalized cortical atrophy. Remote perforator infarct at the left basal ganglia. Vascular: No hyperdense vessel or unexpected calcification. Skull: Normal. Negative for fracture or focal lesion. Sinuses/Orbits: No acute finding. CT CERVICAL SPINE FINDINGS Alignment: Normal Skull base and vertebrae: The anterior and right lateral aspect of the C2 superior facet show step-off and cortical  irregularity. Normal alignment Soft tissues and spinal canal: No prevertebral fluid or swelling. No visible canal hematoma. Disc levels:  No significant degenerative change. Upper chest: Negative These results were called by telephone at the time of interpretation on 05/27/2022 at 2:19 pm to provider [facility] , who verbally acknowledged these results. IMPRESSION: 1. Small corner fracture at the C2 right superior facet. Normal spinal alignment. 2. No evidence of intracranial injury. 3. Advanced cortical atrophy for age. Electronically Signed   By: Jorje Guild M.D.   On: 05/27/2022 14:19   CT Cervical Spine Wo Contrast  Result Date: 05/27/2022 CLINICAL DATA:  Neck trauma with midline tenderness.  Neck pain EXAM: CT HEAD WITHOUT CONTRAST CT CERVICAL SPINE WITHOUT CONTRAST TECHNIQUE: Multidetector CT imaging of the head and cervical spine was performed following the standard protocol without intravenous contrast. Multiplanar CT image reconstructions of the cervical spine were also generated. RADIATION DOSE REDUCTION: This exam was performed according to the departmental dose-optimization program which includes automated exposure control, adjustment of the mA and/or kV according to patient size and/or use of iterative reconstruction technique. COMPARISON:  10/10/2016 brain MRI FINDINGS: CT HEAD FINDINGS Brain: No evidence of acute infarction, hemorrhage, hydrocephalus, extra-axial collection or mass lesion/mass effect. Generalized cortical atrophy. Remote perforator infarct at the left basal ganglia. Vascular: No hyperdense vessel or unexpected calcification. Skull: Normal. Negative for fracture or focal lesion. Sinuses/Orbits: No acute finding. CT CERVICAL SPINE FINDINGS Alignment: Normal Skull base and vertebrae: The anterior and right lateral aspect of the C2 superior facet show step-off and cortical irregularity. Normal alignment Soft tissues and spinal canal: No prevertebral fluid or swelling. No  visible canal hematoma. Disc levels:  No significant degenerative change. Upper chest: Negative These results were called by telephone at the time of interpretation on 05/27/2022 at 2:19 pm to provider [facility] , who verbally acknowledged these results. IMPRESSION: 1. Small corner fracture at the C2 right superior facet. Normal spinal alignment. 2. No evidence of intracranial injury. 3. Advanced cortical atrophy for age. Electronically Signed   By: Jorje Guild M.D.   On: 05/27/2022 14:19  DG Toe 5th Right  Result Date: 05/27/2022 CLINICAL DATA:  Fall with pain of the small toe EXAM: RIGHT FIFTH TOE COMPARISON:  None Available. FINDINGS: Oblique fracture of the mid to proximal portion of the proximal phalanx of the small toe. No definite disruption of the articular surface. Displacement of the fragments by only 1-2 mm. Oblique fracture also affecting the proximal aspect of the proximal phalanx of the fourth toe. No angulation or displacement IMPRESSION: Oblique fractures of the proximal phalanges of the fourth and small toes. Displacement of the fragments by only 1-2 mm at the fifth toe. Electronically Signed   By: Nelson Chimes M.D.   On: 05/27/2022 13:27    Pertinent labs & imaging results that were available during my care of the patient were reviewed by me and considered in my medical decision making (see MDM for details).  Medications Ordered in ED Medications  acetaminophen (TYLENOL) tablet 1,000 mg (1,000 mg Oral Given 05/27/22 1534)                                                                                                                                     Procedures Procedures  (including critical care time)  Medical Decision Making / ED Course   MDM:  51 year old presenting after fall.  Patient well-appearing, vital signs with very mild tachycardia likely due to pain.  Will give Tylenol.  Physical exam notable for tenderness to the foot, neck and right wrist.  CT  scan of the neck and head obtained in triage notable for nondisplaced C2 facet fracture, discussed with neurosurgery who recommends hard collar and follow-up in clinic, no need for further imaging such as MRI or CTA.  Neurologic exam is reassuring.  Patient also with evidence of proximal phalanx fractures to right toe 4 and 5, will place in postop shoe and advised follow-up with podiatry.  X-ray of the wrist obtained due to tenderness of the right wrist without evidence of fracture.  Discussed home pain control. Will discharge patient to home. All questions answered. Patient comfortable with plan of discharge. Return precautions discussed with patient and specified on the after visit summary.       Additional history obtained: -Additional history obtained from family and ems    Imaging Studies ordered: I ordered imaging studies including CT head and neck, XR right foot, XR right wrist On my interpretation imaging demonstrates Right 4th and 5th toe fracture I independently visualized and interpreted imaging. I agree with the radiologist interpretation   Medicines ordered and prescription drug management: Meds ordered this encounter  Medications   acetaminophen (TYLENOL) tablet 1,000 mg    -I have reviewed the patients home medicines and have made adjustments as needed   Consultations Obtained: I requested consultation with the neurosurgeon,  and discussed lab and imaging findings as well as pertinent plan - they recommend: outpatient f/u and hard collar   Cardiac Monitoring: The patient  was maintained on a cardiac monitor.  I personally viewed and interpreted the cardiac monitored which showed an underlying rhythm of: NSR  Reevaluation: After the interventions noted above, I reevaluated the patient and found that they have improved  Co morbidities that complicate the patient evaluation  Past Medical History:  Diagnosis Date   Anxiety    Depression    Diabetes mellitus     Fibroids    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[place])    Hypertension    Stroke [facility])       Dispostion: Disposition decision including need for hospitalization was considered, and patient discharged from emergency department.    Final Clinical Impression(s) / ED Diagnoses Final diagnoses:  Other closed nondisplaced fracture of second cervical vertebra, initial encounter ([place])  Closed nondisplaced fracture of proximal phalanx of lesser toe of right foot, initial encounter     This chart was dictated using voice recognition software.  Despite best efforts to proofread,  errors can occur which can change the documentation meaning.    Cristie Hem, MD 05/27/22 [phone]

## 2022-05-27 NOTE — Discharge Instructions (Signed)
We evaluated you after your fall.  You have a right toe fracture of your little toe and your fourth toe.  Please wear the protective shoe when walking.  Please call to follow-up with podiatry.  You also have a tiny fracture in your neck.  Please wear your neck collar at all times.  Please follow-up with the neurosurgeons in 1 to 2-weeks.  Please take Tylenol and Motrin for your symptoms at home.  You can take 650 mg of Tylenol every 6 hours and 600 mg of ibuprofen every 6 hours as needed for your symptoms.  You can take these medicines together as needed, either at the same time, or alternating every 3 hours.  Please return to the emergency department if you develop any severe pain, numbness or tingling, weakness, trouble walking, headaches, vision changes, trouble speaking, or any other concerning symptoms.

## 2022-05-27 NOTE — ED Triage Notes (Signed)
Pt reports falling while walking up the stairs last night. Endorses neck pain. States she landed on her head. Denies any blood thinners or LOC.

## 2022-06-03 ENCOUNTER — Encounter: Payer: Managed Care, Other (non HMO) | Admitting: Occupational Therapy

## 2022-06-03 ENCOUNTER — Ambulatory Visit (INDEPENDENT_AMBULATORY_CARE_PROVIDER_SITE_OTHER): Payer: Managed Care, Other (non HMO) | Admitting: Podiatry

## 2022-06-03 DIAGNOSIS — Z91199 Patient's noncompliance with other medical treatment and regimen due to unspecified reason: Secondary | ICD-10-CM

## 2022-06-04 NOTE — Progress Notes (Signed)
Patient was no-show for appointment today 

## 2022-09-04 ENCOUNTER — Ambulatory Visit
Admission: RE | Admit: 2022-09-04 | Discharge: 2022-09-04 | Disposition: A | Discharge status: Home / Self Care | Payer: Medicaid Other | Source: Ambulatory Visit | Attending: Family | Admitting: Family

## 2022-09-04 ENCOUNTER — Other Ambulatory Visit: Payer: Self-pay | Admitting: Family

## 2022-09-04 DIAGNOSIS — M545 Low back pain, unspecified: Secondary | ICD-10-CM

## 2022-09-29 ENCOUNTER — Ambulatory Visit: Payer: Medicaid Other | Attending: Speech Pathology | Admitting: Speech Pathology

## 2022-10-06 ENCOUNTER — Ambulatory Visit: Payer: Medicaid Other | Admitting: Speech Pathology

## 2023-01-04 IMAGING — CT CT HEAD CODE STROKE
2 of 4 series · 11 of 47 positions shown, 13 images · non-contrast
Comparison: No prior head CT, correlation is made with MRI head
10/10/2016

CLINICAL DATA: Code stroke.



[Series 5: ang axials · axial · 0.33mm/px · z∈[-175,-59]mm · 8 of 51 slices shown, 10 images]
[im 6/51  brain]
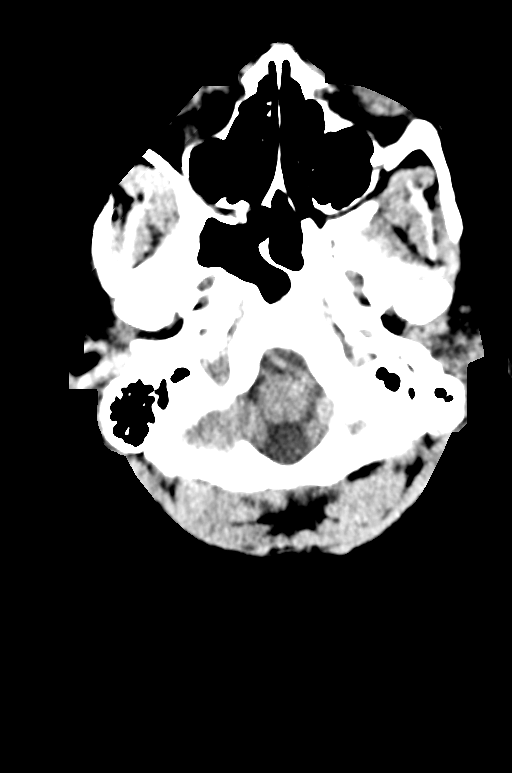
[im 6/51  bone]
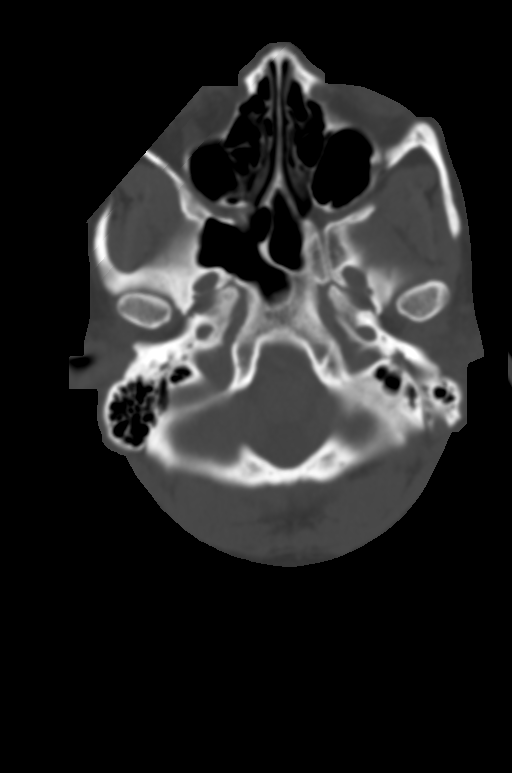
[im 12/51  brain]
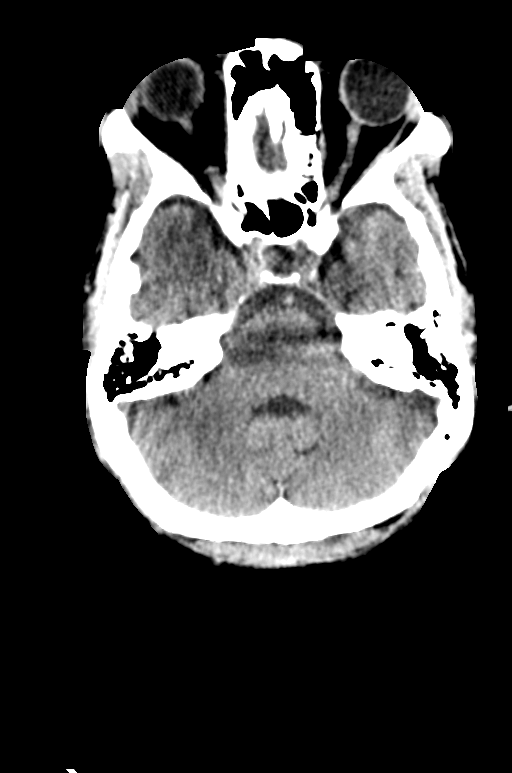
[im 17/51  brain]
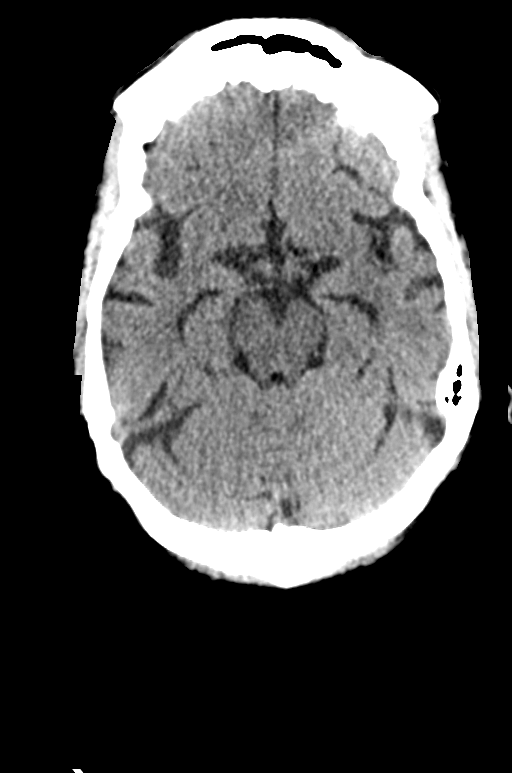
[im 23/51  brain]
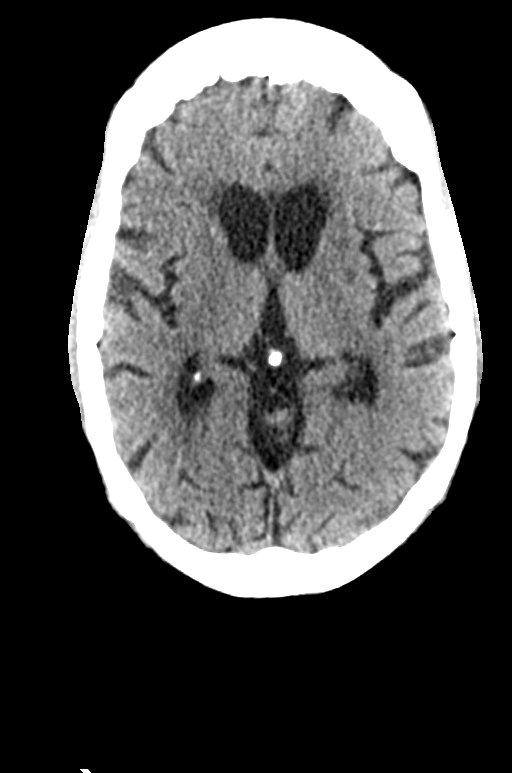
[im 28/51  brain]
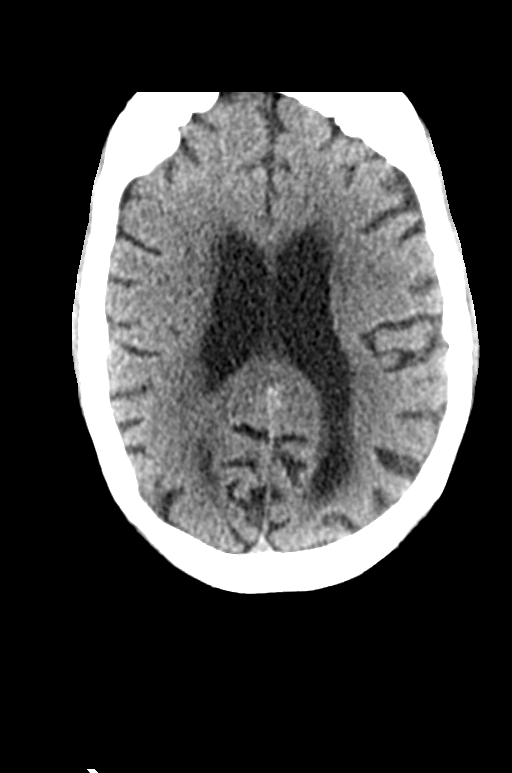
[im 28/51  bone]
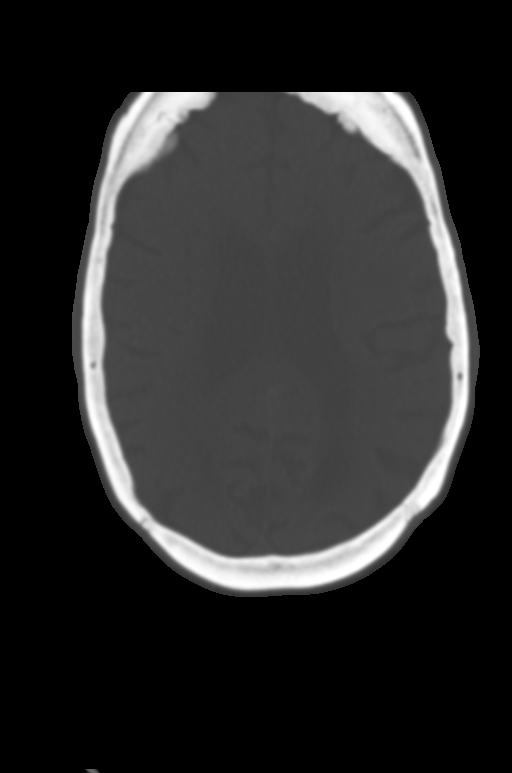
[im 34/51  brain]
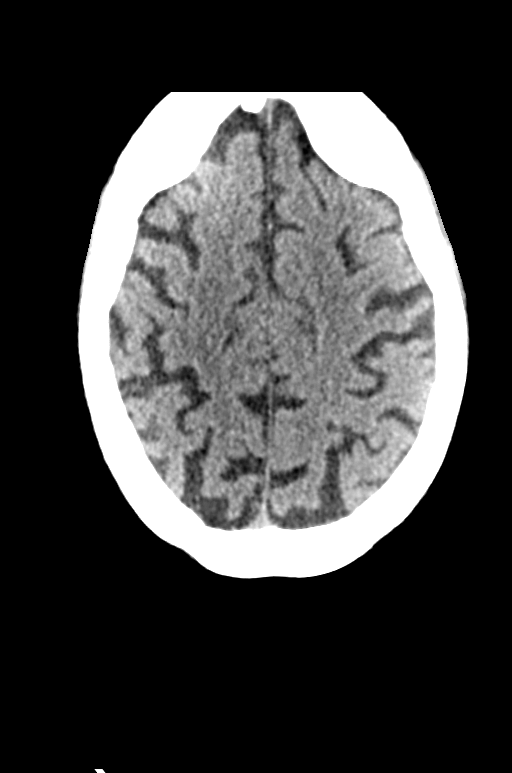
[im 39/51  brain]
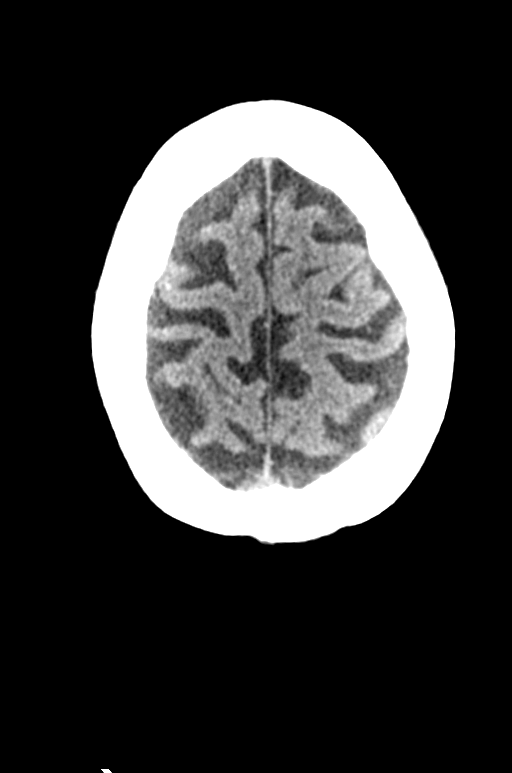
[im 45/51  brain]
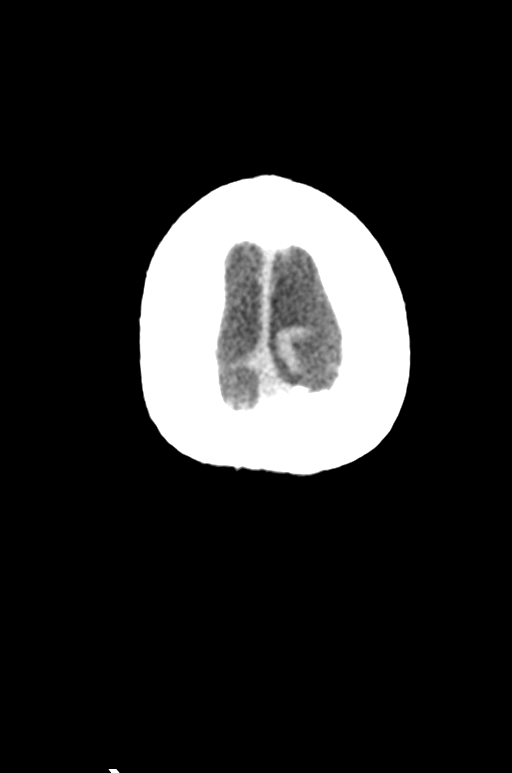

[Series 7: coronal soft tissue · coronal · 0.33mm/px · 3 of 63 slices shown]
[im 21/63  brain]
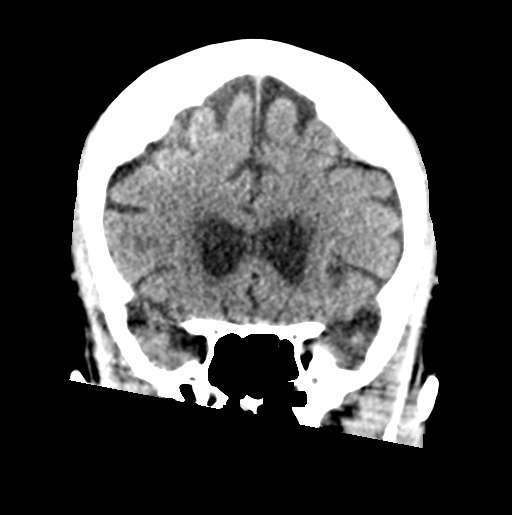
[im 28/63  brain]
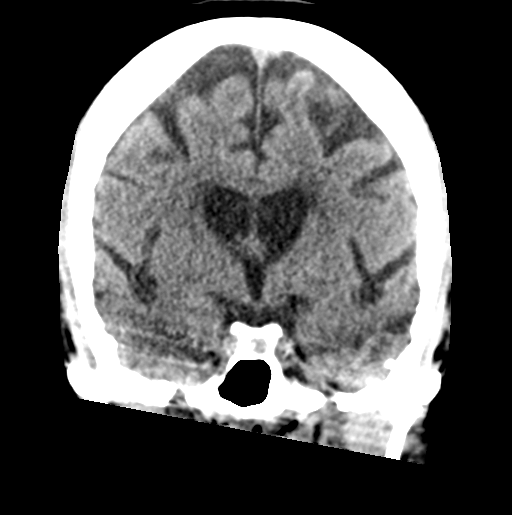
[im 35/63  brain]
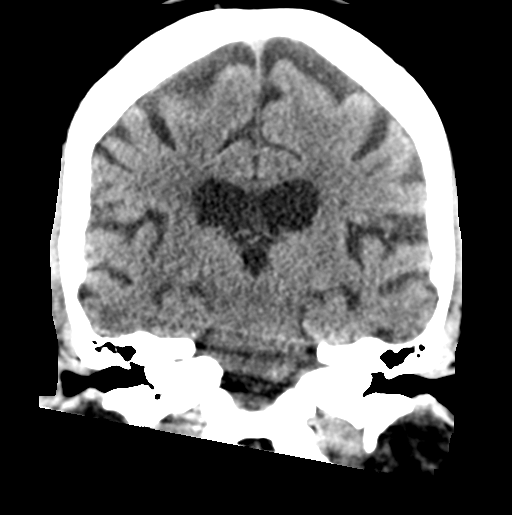

[11 of 47 positions shown; findings below may reference images not displayed]

FINDINGS: Brain: No evidence of acute infarction, hemorrhage, cerebral edema,
mass, mass effect, or midline shift. No hydrocephalus or extra-axial
collection. Mildly advanced cerebral atrophy for age.

Vascular: No hyperdense vessel.

Skull: Negative for fracture or focal lesion.

Sinuses/Orbits: No acute finding.

Other: The mastoid air cells are well aerated.

ASPECTS (Alberta Stroke Program Early CT Score)

- Ganglionic level infarction (caudate, lentiform nuclei, internal
capsule, insula, M1-M3 cortex): 7

- Supraganglionic infarction (M4-M6 cortex): 3

Total score (0-10 with 10 being normal): 10
IMPRESSION: 1. No acute intracranial process.
2. ASPECTS is 10

am to provider WINNIE via telephone, who verbally acknowledged these
results.

## 2023-03-02 ENCOUNTER — Emergency Department (HOSPITAL_COMMUNITY)
Admission: EM | Admit: 2023-03-02 | Discharge: 2023-03-02 | Disposition: A | Discharge status: Home / Self Care | Payer: Medicaid Other | Attending: Emergency Medicine | Admitting: Emergency Medicine

## 2023-03-02 ENCOUNTER — Emergency Department (HOSPITAL_COMMUNITY): Payer: Medicaid Other

## 2023-03-02 ENCOUNTER — Other Ambulatory Visit: Payer: Self-pay

## 2023-03-02 DIAGNOSIS — Y92009 Unspecified place in unspecified non-institutional (private) residence as the place of occurrence of the external cause: Secondary | ICD-10-CM | POA: Insufficient documentation

## 2023-03-02 DIAGNOSIS — Z7984 Long term (current) use of oral hypoglycemic drugs: Secondary | ICD-10-CM | POA: Insufficient documentation

## 2023-03-02 DIAGNOSIS — E119 Type 2 diabetes mellitus without complications: Secondary | ICD-10-CM | POA: Diagnosis not present

## 2023-03-02 DIAGNOSIS — Z794 Long term (current) use of insulin: Secondary | ICD-10-CM | POA: Insufficient documentation

## 2023-03-02 DIAGNOSIS — R0781 Pleurodynia: Secondary | ICD-10-CM | POA: Insufficient documentation

## 2023-03-02 DIAGNOSIS — M542 Cervicalgia: Secondary | ICD-10-CM | POA: Insufficient documentation

## 2023-03-02 DIAGNOSIS — M545 Low back pain, unspecified: Secondary | ICD-10-CM | POA: Insufficient documentation

## 2023-03-02 DIAGNOSIS — Z79899 Other long term (current) drug therapy: Secondary | ICD-10-CM | POA: Insufficient documentation

## 2023-03-02 DIAGNOSIS — I10 Essential (primary) hypertension: Secondary | ICD-10-CM | POA: Insufficient documentation

## 2023-03-02 DIAGNOSIS — Z7982 Long term (current) use of aspirin: Secondary | ICD-10-CM | POA: Insufficient documentation

## 2023-03-02 DIAGNOSIS — R42 Dizziness and giddiness: Secondary | ICD-10-CM | POA: Insufficient documentation

## 2023-03-02 DIAGNOSIS — W19XXXA Unspecified fall, initial encounter: Secondary | ICD-10-CM | POA: Insufficient documentation

## 2023-03-02 LAB — URINALYSIS, ROUTINE W REFLEX MICROSCOPIC
Bilirubin Urine: NEGATIVE
Glucose, UA: NEGATIVE mg/dL
Hgb urine dipstick: NEGATIVE
Ketones, ur: NEGATIVE mg/dL
Leukocytes,Ua: NEGATIVE
Nitrite: NEGATIVE
Protein, ur: NEGATIVE mg/dL
Specific Gravity, Urine: 1.023 (ref 1.005–1.030)
pH: 5 (ref 5.0–8.0)

## 2023-03-02 LAB — CBC WITH DIFFERENTIAL/PLATELET
Abs Immature Granulocytes: 0.02 10*3/uL (ref 0.00–0.07)
Basophils Absolute: 0 10*3/uL (ref 0.0–0.1)
Basophils Relative: 0 %
Eosinophils Absolute: 0 10*3/uL (ref 0.0–0.5)
Eosinophils Relative: 0 %
HCT: 37.4 % (ref 36.0–46.0)
Hemoglobin: 12.6 g/dL (ref 12.0–15.0)
Immature Granulocytes: 0 %
Lymphocytes Relative: 21 %
Lymphs Abs: 1.6 10*3/uL (ref 0.7–4.0)
MCH: 32.5 pg (ref 26.0–34.0)
MCHC: 33.7 g/dL (ref 30.0–36.0)
MCV: 96.4 fL (ref 80.0–100.0)
Monocytes Absolute: 0.4 10*3/uL (ref 0.1–1.0)
Monocytes Relative: 6 %
Neutro Abs: 5.5 10*3/uL (ref 1.7–7.7)
Neutrophils Relative %: 73 %
Platelets: 310 10*3/uL (ref 150–400)
RBC: 3.88 MIL/uL (ref 3.87–5.11)
RDW: 12.1 % (ref 11.5–15.5)
WBC: 7.6 10*3/uL (ref 4.0–10.5)
nRBC: 0 % (ref 0.0–0.2)

## 2023-03-02 LAB — COMPREHENSIVE METABOLIC PANEL
ALT: 15 U/L (ref 0–44)
AST: 16 U/L (ref 15–41)
Albumin: 3.5 g/dL (ref 3.5–5.0)
Alkaline Phosphatase: 50 U/L (ref 38–126)
Anion gap: 15 (ref 5–15)
BUN: 11 mg/dL (ref 6–20)
CO2: 22 mmol/L (ref 22–32)
Calcium: 9 mg/dL (ref 8.9–10.3)
Chloride: 102 mmol/L (ref 98–111)
Creatinine, Ser: 0.79 mg/dL (ref 0.44–1.00)
GFR, Estimated: >60 mL/min (ref >60)
Glucose, Bld: 73 mg/dL (ref 70–99)
Potassium: 3.2 mmol/L — ABNORMAL LOW (ref 3.5–5.1)
Sodium: 139 mmol/L (ref 135–145)
Total Bilirubin: 0.5 mg/dL (ref 0.3–1.2)
Total Protein: 6.9 g/dL (ref 6.5–8.1)

## 2023-03-02 MED ORDER — ACETAMINOPHEN 325 MG PO TABS
650.0000 mg | ORAL_TABLET | Freq: Four times a day (QID) | ORAL | Status: DC | PRN
  Administered 2023-03-02: 650 mg via ORAL
  Filled 2023-03-02: qty 2

## 2023-03-02 NOTE — ED Triage Notes (Signed)
Patient lost her balance and fell at home this evening , denies LOC/ambulatory , reports pain at right lower back radiating to right leg . Alert and oriented /respirations unlabored.

## 2023-03-02 NOTE — ED Provider Notes (Signed)
[place] EMERGENCY DEPARTMENT AT [facility] Provider Note   CSN: [id number] Arrival date & time: 03/02/23  0200     History  Chief Complaint  Patient presents with   Fall    Right Low Back Pain     Hayley Bryant is a 52 y.o. 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  She reports that she was getting up from sitting position to get her phone.  She reports she was dizzy prior to fall but has had dizziness over the past "few months when getting up too quickly".  She fell onto her right side into a fan that broke upon her falling.  She currently complains of right rib pain and neck pain. She denies LOC, head injury, fever, recent illness, blood thinner, visual disturbance.  HPI     Home Medications Prior to Admission medications   Medication Sig Start Date End Date Taking? Authorizing Provider  acetaminophen (TYLENOL) 325 MG tablet Take 1-2 tablets (325-650 mg total) by mouth every 4 (four) hours as needed for mild pain. 12/06/21   Setzer, Lynnell Jude, PA-C  aspirin EC 81 MG tablet Take 1 tablet (81 mg total) by mouth daily. Swallow whole. 12/07/21   Setzer, Lynnell Jude, PA-C  atorvastatin (LIPITOR) 40 MG tablet Take 1 tablet (40 mg total) by mouth at bedtime. 01/21/22   Anson Fret, MD  blood glucose meter kit and supplies KIT Dispense based on patient and insurance preference. Use up to four times daily as directed. 09/16/21   Prosperi, Christian H, PA-C  diclofenac Sodium (VOLTAREN) 1 % GEL Apply 2 g topically 4 (four) times daily. 12/06/21   Setzer, Lynnell Jude, PA-C  FLUoxetine (PROZAC) 20 MG capsule Take 3 capsules (60 mg total) by mouth daily. 12/06/21   Setzer, Lynnell Jude, PA-C  Insulin Glargine (BASAGLAR KWIKPEN) 100 UNIT/ML Inject 15 Units into  the skin daily. 12/06/21   Setzer, Lynnell Jude, PA-C  lisinopril-hydrochlorothiazide (ZESTORETIC) 20-25 MG tablet Take 1 tablet by mouth daily. 12/06/21   Setzer, Lynnell Jude, PA-C  pantoprazole (PROTONIX) 40 MG tablet Take 1 tablet (40 mg total) by mouth daily for 14 days. 12/30/21 01/13/22  Tanda Rockers A, DO  potassium chloride SA (KLOR-CON M) 20 MEQ tablet Take 1 tablet (20 mEq total) by mouth 2 (two) times daily for 3 days. 12/30/21 01/02/22  Sloan Leiter, DO  sucralfate (CARAFATE) 1 g tablet Take 1 tablet (1 g total) by mouth 4 (four) times daily -  with meals and at bedtime for 7 days. 12/30/21 01/06/22  Sloan Leiter, DO  Tirzepatide [facility]) Inject into the skin.    [provider]  traZODone (DESYREL) 50 MG tablet Take 0.5-1 tablets (25-50 mg total) by mouth at bedtime as needed for sleep. 01/21/22   Anson Fret, MD      Allergies    Metformin and related    Review of Systems   Review of Systems  Constitutional:  Negative for fever.  Respiratory:  Negative for chest tightness and shortness of breath.   Cardiovascular:  Negative for chest pain and leg swelling.  Gastrointestinal:  Negative for abdominal pain, diarrhea, nausea and vomiting.  Musculoskeletal:  Positive for neck pain.       Right lower rib pain    Physical Exam  Updated Vital Signs BP 132/88   Pulse 65   Temp 99.7 F (37.6 C) (Oral)   Resp 16   SpO2 98%  Physical Exam Vitals and nursing note reviewed.  Constitutional:      General: She is not in acute distress.    Appearance: Normal appearance. She is not ill-appearing.  HENT:     Head: Normocephalic and atraumatic.     Comments: No Battle sign No facial droop    Mouth/Throat:     Mouth: Mucous membranes are moist.     Pharynx: No oropharyngeal exudate or posterior oropharyngeal erythema.  Eyes:     General: No scleral icterus.       Right eye: No discharge.        Left eye: No discharge.     Extraocular Movements: Extraocular movements intact.      Conjunctiva/sclera: Conjunctivae normal.     Pupils: Pupils are equal, round, and reactive to light.     Comments: No hyphema, subconjunctival hemorrhage, teardrop pupil, fluid leakage from people  Neck:     Comments: No step-off, crepitus, deformity to neck or back spine Cardiovascular:     Rate and Rhythm: Normal rate.     Pulses: Normal pulses.  Pulmonary:     Effort: Pulmonary effort is normal. No respiratory distress.     Breath sounds: Normal breath sounds. No stridor. No wheezing or rhonchi.  Abdominal:     General: There is no distension.     Palpations: Abdomen is soft. There is no mass.     Tenderness: There is no abdominal tenderness. There is no guarding.  Musculoskeletal:     Cervical back: Normal range of motion and neck supple. Tenderness (Midline cervical) present. No rigidity.     Comments: TTP to right lower thoracic region UEs and LEs equal strength and full ROM No crepitus or deformity noted to joints or long bones Pelvis stable with no crepitus or TTP  Lymphadenopathy:     Cervical: No cervical adenopathy.  Skin:    General: Skin is warm.     Capillary Refill: Capillary refill takes less than 2 seconds.     Coloration: Skin is not jaundiced or pale.     Findings: No bruising.  Neurological:     Mental Status: She is alert. Mental status is at baseline.     Comments: Fully alert and oriented Cranial nerves intact, no UE or LE weakness Equal sensation    ED Results / Procedures / Treatments   Labs (all labs ordered are listed, but only abnormal results are displayed) Labs Reviewed  COMPREHENSIVE METABOLIC PANEL - Abnormal; Notable for the following components:      Result Value   Potassium 3.2 (*)    All other components within normal limits  URINALYSIS, ROUTINE W REFLEX MICROSCOPIC - Abnormal; Notable for the following components:   APPearance HAZY (*)    All other components within normal limits  CBC WITH DIFFERENTIAL/PLATELET     EKG None  Radiology CT Cervical Spine Wo Contrast  Result Date: 03/02/2023 CLINICAL DATA:  52 year old female with right rib pain status post fall at home over night. EXAM: CT CERVICAL SPINE WITHOUT CONTRAST TECHNIQUE: Multidetector CT imaging of the cervical spine was performed without intravenous contrast. Multiplanar CT image reconstructions were also generated. RADIATION DOSE REDUCTION: This exam was performed according to the departmental dose-optimization program which includes automated exposure control, adjustment of the mA and/or kV according to patient size and/or use of iterative reconstruction  technique. COMPARISON:  Head CT today.  Cervical spine CT 05/27/2022. FINDINGS: Alignment: Increase straightening of cervical lordosis since last year. Cervicothoracic junction alignment is within normal limits. Bilateral posterior element alignment is within normal limits. Skull base and vertebrae: Bone mineralization is within normal limits. Visualized skull base is intact. No atlanto-occipital dissociation. Congenital incomplete ossification of the posterior C1 ring. C1 and C2 now. Appear intact and aligned No acute osseous abnormality identified. Soft tissues and spinal canal: No prevertebral fluid or swelling. No visible canal hematoma. Minimal calcified carotid atherosclerosis in the neck. Disc levels: Very mild for age cervical spine degeneration. Capacious CT appearance of the spinal canal. Upper chest: Negative. IMPRESSION: No acute traumatic injury identified in the cervical spine. Electronically Signed   By: Odessa Fleming M.D.   On: 03/02/2023 08:08   CT Head Wo Contrast  Result Date: 03/02/2023 CLINICAL DATA:  52 year old female with right rib pain status post fall at home over night. EMR states "Huntington's disease." EXAM: CT HEAD WITHOUT CONTRAST TECHNIQUE: Contiguous axial images were obtained from the base of the skull through the vertex without intravenous contrast. RADIATION DOSE  REDUCTION: This exam was performed according to the departmental dose-optimization program which includes automated exposure control, adjustment of the mA and/or kV according to patient size and/or use of iterative reconstruction technique. COMPARISON:  Brain MRI 11/15/2021.  Head CT 05/27/2022. FINDINGS: Brain: Chronically age advanced cerebral volume loss, nonspecific. No midline shift, ventriculomegaly, mass effect, evidence of mass lesion, intracranial hemorrhage or evidence of cortically based acute infarction. Expected evolution of left basal ganglia lacunar infarct seen last year. Stable gray-white matter differentiation throughout the brain. Vascular: No suspicious intracranial vascular hyperdensity. Skull: Stable and intact.  Hyperostosis, normal variant. Sinuses/Orbits: Visualized paranasal sinuses and mastoids are clear. Other: Similar rightward gaze. No scalp soft tissue injury identified. IMPRESSION: 1. No acute intracranial abnormality or acute traumatic injury identified. 2. Age advanced cerebral atrophy, presumably secondary to EMR listed diagnosis of Huntington's disease. Cerebral Atrophy (ICD10-G31.9). Small chronic left basal ganglia lacunar infarct. Electronically Signed   By: Odessa Fleming M.D.   On: 03/02/2023 08:05   DG Chest 2 View  Result Date: 03/02/2023 CLINICAL DATA:  52 year old female with right rib pain status post fall at home over night. EXAM: CHEST - 2 VIEW COMPARISON:  Chest radiographs 12/30/2021 and earlier. FINDINGS: AP and lateral views at 0742 hours. Lung volumes and mediastinal contours are within normal limits. Visualized tracheal air column is within normal limits. Both lungs appear clear. No pneumothorax or pleural effusion. Stable cholecystectomy clips. Negative visible bowel gas. No acute osseous abnormality identified. IMPRESSION: No acute cardiopulmonary abnormality or acute traumatic injury identified. Electronically Signed   By: Odessa Fleming M.D.   On: 03/02/2023 08:00    DG Lumbar Spine Complete  Result Date: 03/02/2023 CLINICAL DATA:  Fall 2 hours ago with low back pain, initial encounter EXAM: LUMBAR SPINE - COMPLETE 4+ VIEW COMPARISON:  09/04/2022 FINDINGS: Five lumbar type vertebral bodies are well visualized. Vertebral body height is well maintained. No anterolisthesis is noted. No pars defects are seen. No soft tissue abnormality is noted. IMPRESSION: No acute abnormality noted. Electronically Signed   By: Alcide Clever M.D.   On: 03/02/2023 03:14    Procedures Procedures    Medications Ordered in ED Medications  acetaminophen (TYLENOL) tablet 650 mg (650 mg Oral Given 03/02/23 8657)    ED Course/ Medical Decision Making/ A&P  Medical Decision Making    Patient presents to the ED for concern of right lower thoracic cage pain and cervical neck pain since a fall at 0000 today, this involves an extensive number of treatment options, and is a complaint that carries with it a high risk of complications and morbidity.    The differential diagnosis of pain noted upon exam includes ICH, fracture, sprain, contusion.  The differential diagnosis of dizziness is orthostatic hypotension, electrolyte abnormalities, UTI, dehydration   Co morbidities that complicate the patient evaluation  Insulin-dependent type 2 diabetes Huntington disease CVA (11/17/21) with reported right-sided UE weakness   Additional history obtained:  Additional history obtained from  Nursing, Outside Medical Records, and Past Admission   External records from outside source obtained upon chart review   Lab Tests:  I Ordered, and personally interpreted labs.  The pertinent results include: No leukocytosis, electrolyte abnormality, anemia noted UA negative for acute infection   Imaging Studies ordered:  I ordered imaging studies including CT head, CT C-spine, chest x-ray I independently visualized and interpreted imaging which showed no  acute fracture nor ICH I agree with the radiologist interpretation   Cardiac Monitoring:  The patient was maintained on a cardiac monitor.  I personally viewed and interpreted the cardiac monitored which showed an underlying rhythm of: NSR with no STE or ischemia noted   Medicines ordered and prescription drug management:  I ordered medication including Tylenol for mild pain management Reevaluation of the patient after these medicines showed that the patient improved I have reviewed the patients home medicines and have made adjustments as needed   Problem List / ED Course:  Cervical neck pain Right lower rib pain Intermittent dizziness (reported occurring for "a few months when I get up too fast")   Reevaluation:  After the interventions noted above, I reevaluated the patient and found that they have :improved    Dispostion:  Upon evaluation, patient is resting comfortably in bed.  No acute signs of distress.  Patient is speaking in full and complete sentences. Not ill appearing. See HPI.  Patient complains of right lower rib pain and cervical neck pain following fall this morning.  Upon chart review patient's has had a small corner fracture at C2 right superior facet from previous fall on 05/27/2022.  However today there is no acute traumatic injury on cervical nor lumbar spine.  CT head is negative for ICH.  Chest x-ray is negative for acute cardiopulmonary abnormality or traumatic injury.  Right lower thoracic cage pain could be due to nondisplaced rib fracture that is not visualized on chest x-ray or contusion.  She denies abdominal pain or tenderness. Upon review of triage note, it reports there was complaints of pain at right lower back into right leg.  Upon physical exam and further questioning, patient denies right lower back or right leg pain or tenderness.  As patient reports intermittent dizziness and dizziness prior to fall, will obtain lab work and UA to r/o acute  infection.  Labs not significant for leukocytosis anemia, electrolyte abnormalities and CT negative for ICH to explain dizziness.  She reports "few months of dizziness when standing from sitting or laying position too quickly". UA negative for acute infection.    Upon reevaluation at 0658, temperature increased to 99.7 F from initial reading of 98.9 F 5 hours prior. She denies chest pain, SOB, diarrhea, respiratory symptoms, and urinary symptoms.  UA negative for acute infection.  Informed patient of mild elevation of temperature and discussed  taking Tylenol as needed for fever.  After consideration of the diagnostic results and the patients response to treatment, I feel that the patent would benefit from supportive pain control as needed at home and follow-up with PCP for routine medical maintenance and chronic intermittent dizziness likely related to rapid changes in position.  She reports improvement of pain following Tylenol.  Lab work, imaging, treatment plan, and return to emergency department precautions discussed with patient who expresses understanding and agrees with plan.          Final Clinical Impression(s) / ED Diagnoses Final diagnoses:  Fall, initial encounter    Rx / DC Orders ED Discharge Orders     None         Judithann Sheen, PA 03/02/23 0160    Tegeler, Canary Brim, MD 03/02/23 [phone]

## 2023-03-02 NOTE — Discharge Instructions (Addendum)
Thank you for letting us take care of you today.  Your lab work was negative for acute infection or electrolyte abnormality.  Your urine was not significant for infection.  We did not see a fracture on your x-rays.  No bleeding in the head.   I have included [facility] if you need a primary care doctor to follow-up.  These follow-up with them regarding dizziness that you have been experiencing over the past few months and routine medical maintenance.  Take Tylenol or ibuprofen as needed for pain.  Return to emergency department if you experience intractable vomiting causing you to be unable to keep down oral hydration, shortness of breath, chest pain, worsening of symptoms

## 2023-07-02 ENCOUNTER — Emergency Department (HOSPITAL_COMMUNITY)
Admission: EM | Admit: 2023-07-02 | Discharge: 2023-07-02 | Disposition: A | Discharge status: Home / Self Care | Payer: Medicaid Other | Attending: Emergency Medicine | Admitting: Emergency Medicine

## 2023-07-02 ENCOUNTER — Other Ambulatory Visit: Payer: Self-pay

## 2023-07-02 DIAGNOSIS — R590 Localized enlarged lymph nodes: Secondary | ICD-10-CM | POA: Insufficient documentation

## 2023-07-02 DIAGNOSIS — K029 Dental caries, unspecified: Secondary | ICD-10-CM | POA: Diagnosis not present

## 2023-07-02 DIAGNOSIS — E119 Type 2 diabetes mellitus without complications: Secondary | ICD-10-CM | POA: Diagnosis not present

## 2023-07-02 DIAGNOSIS — K0381 Cracked tooth: Secondary | ICD-10-CM | POA: Insufficient documentation

## 2023-07-02 DIAGNOSIS — Z8673 Personal history of transient ischemic attack (TIA), and cerebral infarction without residual deficits: Secondary | ICD-10-CM | POA: Diagnosis not present

## 2023-07-02 DIAGNOSIS — I10 Essential (primary) hypertension: Secondary | ICD-10-CM | POA: Insufficient documentation

## 2023-07-02 DIAGNOSIS — K0889 Other specified disorders of teeth and supporting structures: Secondary | ICD-10-CM | POA: Diagnosis present

## 2023-07-02 MED ORDER — IBUPROFEN 400 MG PO TABS
600.0000 mg | ORAL_TABLET | Freq: Once | ORAL | Status: AC
  Administered 2023-07-02: 600 mg via ORAL
  Filled 2023-07-02: qty 1

## 2023-07-02 MED ORDER — AMOXICILLIN-POT CLAVULANATE 875-125 MG PO TABS: 1.0000 | ORAL_TABLET | Freq: Two times a day (BID) | ORAL | 0 refills | Status: AC

## 2023-07-02 MED ORDER — NAPROXEN 500 MG PO TABS: 500.0000 mg | ORAL_TABLET | Freq: Two times a day (BID) | ORAL | 0 refills | Status: AC

## 2023-07-02 MED ORDER — ACETAMINOPHEN 500 MG PO TABS
1000.0000 mg | ORAL_TABLET | Freq: Once | ORAL | Status: AC
  Administered 2023-07-02: 1000 mg via ORAL
  Filled 2023-07-02: qty 2

## 2023-07-02 MED ORDER — KETOROLAC TROMETHAMINE 60 MG/2ML IM SOLN
60.0000 mg | Freq: Once | INTRAMUSCULAR | Status: AC
  Administered 2023-07-02: 60 mg via INTRAMUSCULAR
  Filled 2023-07-02: qty 2

## 2023-07-02 NOTE — ED Notes (Addendum)
EDP at [facility]. Pt alert, NAD, calm, interactive.

## 2023-07-02 NOTE — ED Provider Notes (Cosign Needed)
[place] EMERGENCY DEPARTMENT AT [facility] Provider Note  MDM   HPI/ROS:  Hayley Bryant is a 53 y.o. female with pertinent past medical history of type 2 diabetes, Huntington's disease who presents for evaluation of dental pain and facial swelling.  Patient reports a 2-3-week history of worsening right lower molar pain with associated right-sided facial swelling.  She has not tried taking any medications for the pain.  She denies any fevers, chills, sore throat, difficulty swallowing, numbness, tingling, dyspnea, chest pain.   Physical exam is notable for: - Right posterior lower molar fractured with exposure of the pulp and dental caries present.  Area is tender to palpation though not tender to percussion -No visible or palpable abscess -Mild right-sided facial swelling -Tender right submandibular lymphadenopathy -Tolerating secretions well with no signs of respiratory distress  On my initial evaluation, patient is:  -Vital signs stable. Patient afebrile, hemodynamically stable, and non-toxic appearing. -Additional history obtained from patient's son  This patient's current presentation, including their history and physical exam, is most consistent with dental infection.  No signs of abscess on exam.  Low suspicion for Ludwig's angina, PTA, RPA, deep space abscess based on history and exam.  Patient with patent airway and no signs or symptoms that would concern for impending airway compromise.  She was given Motrin and Tylenol with significant improvement of pain.  Discussed with patient need for close dental follow-up in the outpatient setting and she was provided with local dental resource guide.  Will treat for dental infection with Augmentin.  Patient was additionally advised to follow-up with her PCP closely.  She was given strict return precautions.  She expressed understanding.  Patient remained stable and had no acute events while under my care in the emergency  department.  Disposition:  I discussed the plan for discharge with the patient and/or their surrogate at bedside prior to discharge and they were in agreement with the plan and verbalized understanding of the return precautions provided. All questions answered to the best of my ability. Ultimately, the patient was discharged in stable condition with stable vital signs. I am reassured that they are capable of close follow up and good social support at home.   Clinical Impression: No diagnosis found.  Rx / DC Orders ED Discharge Orders     None       The plan for this patient was discussed with Dr. Dalene Seltzer, who voiced agreement and who oversaw evaluation and treatment of this patient.   Clinical Complexity A medically appropriate history, review of systems, and physical exam was performed.  My independent interpretations of EKG, labs, and radiology are documented in the ED course above.   If decision rules were used in this patient's evaluation, they are listed below.   Patient's presentation is most consistent with acute presentation with potential threat to life or bodily function.  Medical Decision Making Risk OTC drugs. Prescription drug management.    HPI/ROS      See MDM section for pertinent HPI and ROS. A complete ROS was performed with pertinent positives/negatives noted above.   Past Medical History:  Diagnosis Date   Anxiety    Depression    Diabetes mellitus    Fibroids    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HCC)    Hypertension    Stroke [facility])     Past Surgical History:  Procedure Laterality Date   CHOLECYSTECTOMY     LYMPH NODE DISSECTION        Physical Exam  Vitals:   07/02/23 1152 07/02/23 1555  BP: (!) 135/99 113/83  Pulse: 79 67  Resp: 17 17  Temp: 98.4 F (36.9 C) 98.5 F (36.9 C)  TempSrc: Oral Oral  SpO2: 99% 100%    Physical Exam Gen: NAD. Appears comfortable HENT: Conjunctiva clear, PERRL, EOMI. MMM. Right posterior lower molar  fractured with exposure of the pulp and dental caries present.  Area is tender to palpation though not tender to percussion.  No visible or palpable periapical abscess.  No trismus.  Normal texture of the floor of the mouth. Mild right-sided facial swelling. Tender right submandibular lymphadenopathy CV: RRR. No M/R/G Pulm: Lungs CTAB with no wheezing, rales, or rhonchi. Tolerating secretions well with no signs of respiratory distress GI: Abdomen soft, non-tender, non-distended.  MSK/Skin: No lower extremity edema. Extremities warm, well-perfused with 2+ pulses in all 4 extremities. Neuro: A&Ox3. GCS 15. Moves all extremities.     Procedures   If procedures were preformed on this patient, they are listed below:  Procedures   Mikeal Hawthorne, MD Emergency Medicine PGY-2   Please note that this documentation was produced with the assistance of voice-to-text technology and may contain errors.    Mikeal Hawthorne, MD 07/03/23 [phone]

## 2023-07-02 NOTE — ED Triage Notes (Signed)
Pt. Stated, I have a bad tooth bottom right for 2-3 weeks.

## 2023-07-15 ENCOUNTER — Emergency Department (HOSPITAL_COMMUNITY)
Admission: EM | Admit: 2023-07-15 | Discharge: 2023-07-16 | Disposition: A | Discharge status: Home / Self Care | Payer: Medicaid Other | Attending: Emergency Medicine | Admitting: Emergency Medicine

## 2023-07-15 ENCOUNTER — Emergency Department (HOSPITAL_COMMUNITY): Payer: Medicaid Other

## 2023-07-15 ENCOUNTER — Encounter (HOSPITAL_COMMUNITY): Payer: Self-pay | Admitting: *Deleted

## 2023-07-15 ENCOUNTER — Other Ambulatory Visit: Payer: Self-pay

## 2023-07-15 DIAGNOSIS — Z8673 Personal history of transient ischemic attack (TIA), and cerebral infarction without residual deficits: Secondary | ICD-10-CM | POA: Insufficient documentation

## 2023-07-15 DIAGNOSIS — S99922A Unspecified injury of left foot, initial encounter: Secondary | ICD-10-CM | POA: Diagnosis present

## 2023-07-15 DIAGNOSIS — S80212A Abrasion, left knee, initial encounter: Secondary | ICD-10-CM | POA: Diagnosis not present

## 2023-07-15 DIAGNOSIS — R519 Headache, unspecified: Secondary | ICD-10-CM | POA: Insufficient documentation

## 2023-07-15 DIAGNOSIS — W109XXA Fall (on) (from) unspecified stairs and steps, initial encounter: Secondary | ICD-10-CM | POA: Diagnosis not present

## 2023-07-15 DIAGNOSIS — Z7982 Long term (current) use of aspirin: Secondary | ICD-10-CM | POA: Diagnosis not present

## 2023-07-15 DIAGNOSIS — S92515A Nondisplaced fracture of proximal phalanx of left lesser toe(s), initial encounter for closed fracture: Secondary | ICD-10-CM | POA: Diagnosis not present

## 2023-07-15 DIAGNOSIS — Z79899 Other long term (current) drug therapy: Secondary | ICD-10-CM | POA: Insufficient documentation

## 2023-07-15 DIAGNOSIS — Z794 Long term (current) use of insulin: Secondary | ICD-10-CM | POA: Diagnosis not present

## 2023-07-15 DIAGNOSIS — I10 Essential (primary) hypertension: Secondary | ICD-10-CM | POA: Insufficient documentation

## 2023-07-15 DIAGNOSIS — E119 Type 2 diabetes mellitus without complications: Secondary | ICD-10-CM | POA: Diagnosis not present

## 2023-07-15 DIAGNOSIS — S00212A Abrasion of left eyelid and periocular area, initial encounter: Secondary | ICD-10-CM | POA: Insufficient documentation

## 2023-07-15 NOTE — ED Provider Triage Note (Signed)
Emergency Medicine Provider Triage Evaluation Note  DEEANA ATWATER , a 53 y.o. female  was evaluated in triage.  Pt complains of mechanical Fall down 4-5 steps.  Review of Systems  Positive: Hit head, L knee pain, HA, L foot pain, neck pain midline Negative: LOC, N/V/D, vision changes, blood thinner, CP, SHOB  Physical Exam  BP (!) 135/93 (BP Location: Right Arm)   Pulse 75   Temp 98.4 F (36.9 C)   Resp 16   Ht 5\' 9"  (1.753 m)   Wt 68 kg   LMP 06/18/2023   SpO2 100%   BMI 22.14 kg/m  Gen:   Awake, no distress  Resp:  Normal effort  MSK:   Moves extremities without difficulty  Other:    Medical Decision Making  Medically screening exam initiated at 7:29 PM.  Appropriate orders placed.  SARIKA BALDINI was informed that the remainder of the evaluation will be completed by another provider, this initial triage assessment does not replace that evaluation, and the importance of remaining in the ED until their evaluation is complete.  Imaging ordered   Gretta Began 07/15/23 1932

## 2023-07-15 NOTE — ED Triage Notes (Signed)
C-collar placed on the pt

## 2023-07-15 NOTE — ED Triage Notes (Signed)
The pt fell down some steps at 1700 today she is c/o pain in her neck  she has a headache and she is c/o lt foot pain  no loss of consciousness

## 2023-07-16 MED ORDER — OXYCODONE-ACETAMINOPHEN 5-325 MG PO TABS
1.0000 | ORAL_TABLET | Freq: Once | ORAL | Status: AC
  Administered 2023-07-16: 1 via ORAL
  Filled 2023-07-16: qty 1

## 2023-07-16 NOTE — ED Provider Notes (Signed)
[place] EMERGENCY DEPARTMENT AT [facility] Provider Note  CSN: [id number] Arrival date & time: 07/15/23 1851  Chief Complaint(s) Fall  HPI Hayley Bryant is a 53 y.o. female history of diabetes, prior stroke presenting to the emergency department with fall.  Patient reports that she tripped, fell down a few steps.  Reports pain to her left foot, left knee.  Did also strike her head, has mild headache.  Also has some mild neck pain.  Denies any chest pain, back pain.  No abdominal pain.  No pain in her arms or right lower extremity.  Has been ambulatory since the fall.   Past Medical History Past Medical History:  Diagnosis Date   Anxiety    Depression    Diabetes mellitus    Fibroids    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HCC)    Hypertension    Stroke [facility])    Patient Active Problem List   Diagnosis Date Noted   Acute ischemic stroke (HCC) 11/22/2021   Acute CVA (cerebrovascular accident) (HCC) 11/15/2021   Abnormal LFTs 11/15/2021   Hyperlipidemia associated with type 2 diabetes mellitus (HCC) 09/07/2017   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HCC) 05/12/2017   Uncontrolled type 2 diabetes mellitus with hyperglycemia (HCC) 05/07/2017   Overweight (BMI 25.0-29.9) 09/20/2013   Difficulty sleeping 09/20/2013   Vitamin D deficiency 06/21/2013   Suicidal overdose (HCC) 12/06/2012   HTN (hypertension) 12/06/2012   Type 2 diabetes mellitus (HCC) 12/06/2012   Depression 07/13/2012   Anxiety 07/13/2012   Home Medication(s) Prior to Admission medications   Medication Sig Start Date End Date Taking? Authorizing Provider  acetaminophen (TYLENOL) 325 MG tablet Take 1-2 tablets (325-650 mg total) by mouth every 4 (four) hours as needed for mild pain. 12/06/21   Setzer, Lynnell Jude, PA-C  aspirin EC 81 MG tablet Take 1 tablet (81 mg total) by mouth daily. Swallow whole. 12/07/21   Setzer, Lynnell Jude, PA-C  atorvastatin (LIPITOR) 40 MG tablet Take 1 tablet (40 mg total) by mouth at bedtime. 01/21/22    Anson Fret, MD  blood glucose meter kit and supplies KIT Dispense based on patient and insurance preference. Use up to four times daily as directed. 09/16/21   Prosperi, Christian H, PA-C  FLUoxetine (PROZAC) 20 MG capsule Take 3 capsules (60 mg total) by mouth daily. 12/06/21   Setzer, Lynnell Jude, PA-C  Insulin Glargine (BASAGLAR KWIKPEN) 100 UNIT/ML Inject 15 Units into the skin daily. 12/06/21   Setzer, Lynnell Jude, PA-C  lisinopril-hydrochlorothiazide (ZESTORETIC) 20-25 MG tablet Take 1 tablet by mouth daily. 12/06/21   Setzer, Lynnell Jude, PA-C  naproxen (NAPROSYN) 500 MG tablet Take 1 tablet (500 mg total) by mouth 2 (two) times daily. 07/02/23   Alvira Monday, MD  pantoprazole (PROTONIX) 40 MG tablet Take 1 tablet (40 mg total) by mouth daily for 14 days. 12/30/21 01/13/22  Tanda Rockers A, DO  potassium chloride SA (KLOR-CON M) 20 MEQ tablet Take 1 tablet (20 mEq total) by mouth 2 (two) times daily for 3 days. 12/30/21 01/02/22  Sloan Leiter, DO  sucralfate (CARAFATE) 1 g tablet Take 1 tablet (1 g total) by mouth 4 (four) times daily -  with meals and at bedtime for 7 days. 12/30/21 01/06/22  Sloan Leiter, DO  Tirzepatide [facility]) Inject into the skin.    [provider]  traZODone (DESYREL) 50 MG tablet Take 0.5-1 tablets (25-50 mg total) by mouth at bedtime as needed for sleep. 01/21/22   Anson Fret,  MD                                                                                                                                    Past Surgical History Past Surgical History:  Procedure Laterality Date   CHOLECYSTECTOMY     LYMPH NODE DISSECTION     Family History Family History  Problem Relation Age of Onset   Hypertension Mother    Heart attack Mother    Huntington's disease Mother    Huntington's disease Maternal Grandmother    Stroke Neg Hx     Social History Social History   Tobacco Use   Smoking status: Never   Smokeless tobacco: Never  Vaping Use   Vaping  status: Never Used  Substance Use Topics   Alcohol use: No   Drug use: No   Allergies Metformin and related  Review of Systems Review of Systems  All other systems reviewed and are negative.   Physical Exam Vital Signs  I have reviewed the triage vital signs BP 109/70 (BP Location: Right Arm)   Pulse 82   Temp 98.4 F (36.9 C) (Oral)   Resp 19   Ht 5\' 9"  (1.753 m)   Wt 68 kg   LMP 06/18/2023   SpO2 98%   BMI 22.14 kg/m  Physical Exam Vitals and nursing note reviewed.  Constitutional:      General: She is not in acute distress.    Appearance: She is well-developed.  HENT:     Head: Normocephalic.     Comments: Very slight abrasion to the left eyebrow, no wound    Mouth/Throat:     Mouth: Mucous membranes are moist.  Eyes:     Pupils: Pupils are equal, round, and reactive to light.  Cardiovascular:     Rate and Rhythm: Normal rate and regular rhythm.     Heart sounds: No murmur heard. Pulmonary:     Effort: Pulmonary effort is normal. No respiratory distress.     Breath sounds: Normal breath sounds.  Abdominal:     General: Abdomen is flat.     Palpations: Abdomen is soft.     Tenderness: There is no abdominal tenderness.  Musculoskeletal:        General: No tenderness.     Right lower leg: No edema.     Left lower leg: No edema.     Comments: No midline C, T, L-spine tenderness.  No chest wall tenderness or crepitus.  Full painless range of motion at the bilateral upper extremities including the shoulders, elbows, wrists, hand and fingers, and in the bilateral lower extremities including the hips, knees, ankle.  No focal bony tenderness, injury or deformity, with the exception of the left little toe which is mildly tender.  No other tenderness to the left foot   Skin:    General: Skin is warm and dry.     Comments: Small abrasion  to the left knee  Neurological:     General: No focal deficit present.     Mental Status: She is alert. Mental status is at  baseline.  Psychiatric:        Mood and Affect: Mood normal.        Behavior: Behavior normal.     ED Results and Treatments Labs (all labs ordered are listed, but only abnormal results are displayed) Labs Reviewed - No data to display                                                                                                                        Radiology CT Head Wo Contrast Result Date: 07/15/2023 CLINICAL DATA:  Head trauma, focal neuro findings (Age 31-64y); Neck trauma, midline tenderness (Age 9-64y) EXAM: CT HEAD WITHOUT CONTRAST CT CERVICAL SPINE WITHOUT CONTRAST TECHNIQUE: Multidetector CT imaging of the head and cervical spine was performed following the standard protocol without intravenous contrast. Multiplanar CT image reconstructions of the cervical spine were also generated. RADIATION DOSE REDUCTION: This exam was performed according to the departmental dose-optimization program which includes automated exposure control, adjustment of the mA and/or kV according to patient size and/or use of iterative reconstruction technique. COMPARISON:  CT head and CT cervical spine 03/02/2023. FINDINGS: CT HEAD FINDINGS Brain: No evidence of acute infarction, hemorrhage, hydrocephalus, extra-axial collection or mass lesion/mass effect. Vascular: No hyperdense vessel. Skull: No acute fracture. Sinuses/Orbits: Clear sinuses.  No acute orbital findings. Other: No mastoid effusions. CT CERVICAL SPINE FINDINGS Alignment: Straightening.  No substantial sagittal subluxation. Skull base and vertebrae: No acute fracture. Soft tissues and spinal canal: No prevertebral fluid or swelling. No visible canal hematoma. Disc levels:  No significant bony degenerative change. Upper chest: Visualized lung apices are clear. IMPRESSION: No acute intracranial or cervical spine finding. Electronically Signed   By: Feliberto Harts M.D.   On: 07/15/2023 21:02   CT Cervical Spine Wo Contrast Result Date:  07/15/2023 CLINICAL DATA:  Head trauma, focal neuro findings (Age 66-64y); Neck trauma, midline tenderness (Age 24-64y) EXAM: CT HEAD WITHOUT CONTRAST CT CERVICAL SPINE WITHOUT CONTRAST TECHNIQUE: Multidetector CT imaging of the head and cervical spine was performed following the standard protocol without intravenous contrast. Multiplanar CT image reconstructions of the cervical spine were also generated. RADIATION DOSE REDUCTION: This exam was performed according to the departmental dose-optimization program which includes automated exposure control, adjustment of the mA and/or kV according to patient size and/or use of iterative reconstruction technique. COMPARISON:  CT head and CT cervical spine 03/02/2023. FINDINGS: CT HEAD FINDINGS Brain: No evidence of acute infarction, hemorrhage, hydrocephalus, extra-axial collection or mass lesion/mass effect. Vascular: No hyperdense vessel. Skull: No acute fracture. Sinuses/Orbits: Clear sinuses.  No acute orbital findings. Other: No mastoid effusions. CT CERVICAL SPINE FINDINGS Alignment: Straightening.  No substantial sagittal subluxation. Skull base and vertebrae: No acute fracture. Soft tissues and spinal canal: No prevertebral fluid or swelling. No visible canal hematoma. Disc levels:  No significant bony  degenerative change. Upper chest: Visualized lung apices are clear. IMPRESSION: No acute intracranial or cervical spine finding. Electronically Signed   By: Feliberto Harts M.D.   On: 07/15/2023 21:02   DG Foot Complete Left Result Date: 07/15/2023 CLINICAL DATA:  Pain in foot and knee after fall EXAM: LEFT FOOT - COMPLETE 3+ VIEW COMPARISON:  None Available. FINDINGS: Acute nondisplaced intra-articular fracture of the base of the fifth toe proximal phalanx. Adjacent soft tissue swelling. No dislocation. IMPRESSION: Acute nondisplaced intra-articular fracture of the base of the fifth toe proximal phalanx. Electronically Signed   By: Minerva Fester M.D.   On:  07/15/2023 20:51   DG Knee Complete 4 Views Left Result Date: 07/15/2023 CLINICAL DATA:  Pain in foot and knee after fall EXAM: LEFT KNEE - COMPLETE 4+ VIEW COMPARISON:  None Available. FINDINGS: No acute fracture or dislocation. Chronic osseous fragment at the tibial tuberosity. No knee joint effusion. Soft tissues are unremarkable. IMPRESSION: Negative. Electronically Signed   By: Minerva Fester M.D.   On: 07/15/2023 20:50    Pertinent labs & imaging results that were available during my care of the patient were reviewed by me and considered in my medical decision making (see MDM for details).  Medications Ordered in ED Medications  oxyCODONE-acetaminophen (PERCOCET/ROXICET) 5-325 MG per tablet 1 tablet (has no administration in time range)                                                                                                                                     Procedures Procedures  (including critical care time)  Medical Decision Making / ED Course   MDM:  53 year old with fall.  Patient well-appearing, physical examination with some tenderness left toe, abrasion to the left knee, abrasion to the left eyebrow.  Imaging obtained, notable for left little toe fracture to the proximal phalanx.  Will buddy tape and place in postop shoe.  Advised follow-up with orthopedic surgery.  Patient did have some knee pain and abrasion to the knee, x-ray negative, she has been ambulatory and weightbearing, low concern for occult fracture.  Patient also did strike her head, had some neck pain as well, CT head and CT cervical spine negative for acute traumatic injury.  Physical examination without other signs of trauma.  Patient reports that her fall was due to a trip rather than any medical process such as a syncopal event or near syncopal event.  Will discharge patient to home. All questions answered. Patient comfortable with plan of discharge. Return precautions discussed with patient and  specified on the after visit summary.       Additional history obtained: -Additional history obtained from family -External records from outside source obtained and reviewed including: Chart review including previous notes, labs, imaging, consultation notes including prior notes   Imaging Studies ordered: I ordered imaging studies including CT head, neck, XR knee and foot On my interpretation imaging demonstrates  proximal phalanx L little toe fx  I independently visualized and interpreted imaging. I agree with the radiologist interpretation   Medicines ordered and prescription drug management: Meds ordered this encounter  Medications   oxyCODONE-acetaminophen (PERCOCET/ROXICET) 5-325 MG per tablet 1 tablet    Refill:  0    -I have reviewed the patients home medicines and have made adjustments as needed    Reevaluation: After the interventions noted above, I reevaluated the patient and found that their symptoms have improved  Co morbidities that complicate the patient evaluation  Past Medical History:  Diagnosis Date   Anxiety    Depression    Diabetes mellitus    Fibroids    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HCC)    Hypertension    Stroke (HCC)       Dispostion: Disposition decision including need for hospitalization was considered, and patient discharged from emergency department.    Final Clinical Impression(s) / ED Diagnoses Final diagnoses:  Closed nondisplaced fracture of proximal phalanx of lesser toe of left foot, initial encounter     This chart was dictated using voice recognition software.  Despite best efforts to proofread,  errors can occur which can change the documentation meaning.    Lonell Grandchild, MD 07/16/23 [phone]

## 2023-07-16 NOTE — Discharge Instructions (Addendum)
We evaluated you for your fall.  Your testing shows that you have a broken toe (little toe).  We have taped your toes together and placed you in a special shoe.  Please follow-up with orthopedic surgery.  It is okay to continue to walk with this type of fracture.  Please take Tylenol and Motrin for your symptoms at home.  You can take 1000 mg of Tylenol every 6 hours and 600 mg of ibuprofen every 6 hours as needed for your symptoms.  You can take these medicines together as needed, either at the same time, or alternating every 3 hours.  Please return for any new or worsening symptoms such as severe pain, inability to walk, or any other concerning symptoms.

## 2024-01-13 ENCOUNTER — Encounter (HOSPITAL_COMMUNITY): Payer: Self-pay

## 2024-01-13 ENCOUNTER — Emergency Department (HOSPITAL_COMMUNITY)
Admission: EM | Admit: 2024-01-13 | Discharge: 2024-01-13 | Disposition: A | Discharge status: Home / Self Care | Payer: MEDICAID | Attending: Emergency Medicine | Admitting: Emergency Medicine

## 2024-01-13 ENCOUNTER — Emergency Department (HOSPITAL_COMMUNITY): Payer: MEDICAID

## 2024-01-13 ENCOUNTER — Other Ambulatory Visit: Payer: Self-pay

## 2024-01-13 DIAGNOSIS — Z7982 Long term (current) use of aspirin: Secondary | ICD-10-CM | POA: Insufficient documentation

## 2024-01-13 DIAGNOSIS — S01511A Laceration without foreign body of lip, initial encounter: Secondary | ICD-10-CM

## 2024-01-13 DIAGNOSIS — M542 Cervicalgia: Secondary | ICD-10-CM | POA: Diagnosis not present

## 2024-01-13 DIAGNOSIS — Z794 Long term (current) use of insulin: Secondary | ICD-10-CM | POA: Insufficient documentation

## 2024-01-13 DIAGNOSIS — S0990XA Unspecified injury of head, initial encounter: Secondary | ICD-10-CM | POA: Insufficient documentation

## 2024-01-13 DIAGNOSIS — W182XXA Fall in (into) shower or empty bathtub, initial encounter: Secondary | ICD-10-CM | POA: Diagnosis not present

## 2024-01-13 DIAGNOSIS — Y92002 Bathroom of unspecified non-institutional (private) residence single-family (private) house as the place of occurrence of the external cause: Secondary | ICD-10-CM | POA: Insufficient documentation

## 2024-01-13 DIAGNOSIS — S0083XA Contusion of other part of head, initial encounter: Secondary | ICD-10-CM

## 2024-01-13 DIAGNOSIS — S0993XA Unspecified injury of face, initial encounter: Secondary | ICD-10-CM | POA: Diagnosis present

## 2024-01-13 MED ORDER — ACETAMINOPHEN 500 MG PO TABS
1000.0000 mg | ORAL_TABLET | Freq: Once | ORAL | Status: AC
  Administered 2024-01-13 (×2): 1000 mg via ORAL
  Filled 2024-01-13: qty 2

## 2024-01-13 MED ORDER — AMOXICILLIN 500 MG PO CAPS: 500.0000 mg | ORAL_CAPSULE | Freq: Three times a day (TID) | ORAL | 0 refills | Status: AC

## 2024-01-13 NOTE — ED Notes (Signed)
 Pt has provided urine sample if needed, at bedside to send to main lab.

## 2024-01-13 NOTE — ED Provider Notes (Signed)
 [place] EMERGENCY DEPARTMENT AT [facility] Provider Note   CSN: [id number] Arrival date & time: 01/13/24  0216     Patient presents with: Hayley   [name] is a 53 y.o. female.   Presents to the emergency department from home by ambulance.  Patient had a mechanical fall while in the bathroom.  She fell forward and hit her head and mouth on the bathtub.  No loss of consciousness.  Complains of a laceration to the lower lip and right sided neck pain.       Prior to Admission medications   Medication Sig Start Date End Date Taking? Authorizing Provider  amoxicillin  (AMOXIL ) 500 MG capsule Take 1 capsule (500 mg total) by mouth 3 (three) times daily. 01/13/24  Yes Waver Dibiasio, Lonni PARAS, MD  acetaminophen  (TYLENOL ) 325 MG tablet Take 1-2 tablets (325-650 mg total) by mouth every 4 (four) hours as needed for mild pain. 12/06/21   Setzer, Nena PARAS, PA-C  aspirin  EC 81 MG tablet Take 1 tablet (81 mg total) by mouth daily. Swallow whole. 12/07/21   Setzer, Nena PARAS, PA-C  atorvastatin  (LIPITOR) 40 MG tablet Take 1 tablet (40 mg total) by mouth at bedtime. 01/21/22   Ines Onetha NOVAK, MD  blood glucose meter kit and supplies KIT Dispense based on patient and insurance preference. Use up to four times daily as directed. 09/16/21   Prosperi, Christian H, PA-C  FLUoxetine  (PROZAC ) 20 MG capsule Take 3 capsules (60 mg total) by mouth daily. 12/06/21   Setzer, Sandra J, PA-C  Insulin  Glargine (BASAGLAR  KWIKPEN) 100 UNIT/ML Inject 15 Units into the skin daily. 12/06/21   Setzer, Sandra J, PA-C  lisinopril -hydrochlorothiazide  (ZESTORETIC ) 20-25 MG tablet Take 1 tablet by mouth daily. 12/06/21   Setzer, Sandra J, PA-C  naproxen  (NAPROSYN ) 500 MG tablet Take 1 tablet (500 mg total) by mouth 2 (two) times daily. 07/02/23   Dreama Longs, MD  pantoprazole  (PROTONIX ) 40 MG tablet Take 1 tablet (40 mg total) by mouth daily for 14 days. 12/30/21 01/13/22  Elnor Jayson LABOR, DO  potassium chloride  SA  (KLOR-CON  M) 20 MEQ tablet Take 1 tablet (20 mEq total) by mouth 2 (two) times daily for 3 days. 12/30/21 01/02/22  Elnor Jayson LABOR, DO  sucralfate  (CARAFATE ) 1 g tablet Take 1 tablet (1 g total) by mouth 4 (four) times daily -  with meals and at bedtime for 7 days. 12/30/21 01/06/22  Elnor Jayson LABOR, DO  Tirzepatide (MOUNJARO [place]) Inject into the skin.    [provider]  traZODone  (DESYREL ) 50 MG tablet Take 0.5-1 tablets (25-50 mg total) by mouth at bedtime as needed for sleep. 01/21/22   Ines Onetha NOVAK, MD    Allergies: Metformin and related    Review of Systems  Updated Vital Signs BP 133/80   Pulse 68   Temp 98.4 F (36.9 C) (Oral)   Resp 14   SpO2 100%   Physical Exam Vitals and nursing note reviewed.  Constitutional:      General: She is not in acute distress.    Appearance: She is well-developed.  HENT:     Head: Normocephalic. Laceration (Macerated wound right side of lower lip, does not cross vermilion bordern (primarily intraoral)) present.     Mouth/Throat:     Mouth: Mucous membranes are moist.   Eyes:     General: Vision grossly intact. Gaze aligned appropriately.     Extraocular Movements: Extraocular movements intact.     Conjunctiva/sclera: Conjunctivae  normal.  Cardiovascular:     Rate and Rhythm: Normal rate and regular rhythm.     Pulses: Normal pulses.     Heart sounds: Normal heart sounds, S1 normal and S2 normal. No murmur heard.    No friction rub. No gallop.  Pulmonary:     Effort: Pulmonary effort is normal. No respiratory distress.     Breath sounds: Normal breath sounds.  Abdominal:     General: Bowel sounds are normal.     Palpations: Abdomen is soft.     Tenderness: There is no abdominal tenderness. There is no guarding or rebound.     Hernia: No hernia is present.  Musculoskeletal:        General: No swelling.     Cervical back: Normal range of motion and neck supple. Pain with movement and muscular tenderness present. No spinous process  tenderness. Normal range of motion.     Right lower leg: No edema.     Left lower leg: No edema.  Skin:    General: Skin is warm and dry.     Capillary Refill: Capillary refill takes less than 2 seconds.     Findings: No ecchymosis, erythema, rash or wound.  Neurological:     General: No focal deficit present.     Mental Status: She is alert and oriented to person, place, and time.     GCS: GCS eye subscore is 4. GCS verbal subscore is 5. GCS motor subscore is 6.     Cranial Nerves: Cranial nerves 2-12 are intact.     Sensory: Sensation is intact.     Motor: Motor function is intact.     Coordination: Coordination is intact.  Psychiatric:        Attention and Perception: Attention normal.        Mood and Affect: Mood normal.        Speech: Speech normal.        Behavior: Behavior normal.     (all labs ordered are listed, but only abnormal results are displayed) Labs Reviewed - No data to display  EKG: None  Radiology: CT HEAD WO CONTRAST ( ) Result Date: 01/13/2024 CLINICAL DATA:  Neck trauma, dangerous injury mechanism (Age 64-64y); Head trauma, moderate-severe; Facial trauma, blunt EXAM: CT HEAD WITHOUT CONTRAST CT MAXILLOFACIAL WITHOUT CONTRAST CT CERVICAL SPINE WITHOUT CONTRAST TECHNIQUE: Multidetector CT imaging of the head, cervical spine, and maxillofacial structures were performed using the standard protocol without intravenous contrast. Multiplanar CT image reconstructions of the cervical spine and maxillofacial structures were also generated. RADIATION DOSE REDUCTION: This exam was performed according to the departmental dose-optimization program which includes automated exposure control, adjustment of the mA and/or kV according to patient size and/or use of iterative reconstruction technique. COMPARISON:  None Available. FINDINGS: CT HEAD FINDINGS Brain: Normal anatomic configuration. Moderate global parenchymal volume loss is relatively advanced given the patient's age.  Mild periventricular white matter changes are present likely reflecting the sequela of small vessel ischemia. 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 No abnormal intra or extra-axial mass lesion or fluid collection. No abnormal mass effect or midline shift. No evidence of acute intracranial hemorrhage or infarct. Borderline ventriculomegaly is commensurate with the degree of parenchymal volume loss and likely represents ex vacuo dilation. Cerebellum is relatively spared and is unremarkable. Vascular: No asymmetric hyperdense vasculature at the skull base. Skull: Intact Other: Mastoid air cells and middle ear cavities are clear. CT MAXILLOFACIAL FINDINGS Osseous: No fracture or mandibular dislocation. No destructive  process. Numerous absent teeth. Periapical erosions and large dental carie involving the of the residual right mandibular molar. Orbits: Negative. No traumatic or inflammatory finding. Sinuses: Clear. Soft tissues: There is asymmetric soft tissue swelling involving the right parasagittal lower lip. Facial soft tissues are otherwise unremarkable. CT CERVICAL SPINE FINDINGS Alignment: Normal. Skull base and vertebrae: No acute fracture. No primary bone lesion or focal pathologic process. Soft tissues and spinal canal: No prevertebral fluid or swelling. No visible canal hematoma. Disc levels: Ureter mild endplate remodeling and disc space narrowing is seen at C5-6 and C6-7 in keeping with changes of mild degenerative disc disease. Prevertebral soft tissues are not thickened. Spinal canal is widely patent. No significant neuroforaminal narrowing. Upper chest: Negative. Other: None IMPRESSION: 1. No acute intracranial abnormality. No calvarial fracture. 2. Moderate global parenchymal volume loss, relatively advanced given the patient's age. 3. No acute facial fracture. 4. No acute fracture or listhesis of the cervical spine. 5. Mild degenerative disc disease at C5-6 and C6-7. 6. Asymmetric soft  tissue swelling involving the right parasagittal lower lip. Electronically Signed   By: Dorethia Molt M.D.   On: 01/13/2024 03:18   CT MAXILLOFACIAL WO CONTRAST Result Date: 01/13/2024 CLINICAL DATA:  Neck trauma, dangerous injury mechanism (Age 74-64y); Head trauma, moderate-severe; Facial trauma, blunt EXAM: CT HEAD WITHOUT CONTRAST CT MAXILLOFACIAL WITHOUT CONTRAST CT CERVICAL SPINE WITHOUT CONTRAST TECHNIQUE: Multidetector CT imaging of the head, cervical spine, and maxillofacial structures were performed using the standard protocol without intravenous contrast. Multiplanar CT image reconstructions of the cervical spine and maxillofacial structures were also generated. RADIATION DOSE REDUCTION: This exam was performed according to the departmental dose-optimization program which includes automated exposure control, adjustment of the mA and/or kV according to patient size and/or use of iterative reconstruction technique. COMPARISON:  None Available. FINDINGS: CT HEAD FINDINGS Brain: Normal anatomic configuration. Moderate global parenchymal volume loss is relatively advanced given the patient's age. Mild periventricular white matter changes are present likely reflecting the sequela of small vessel ischemia. 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 No abnormal intra or extra-axial mass lesion or fluid collection. No abnormal mass effect or midline shift. No evidence of acute intracranial hemorrhage or infarct. Borderline ventriculomegaly is commensurate with the degree of parenchymal volume loss and likely represents ex vacuo dilation. Cerebellum is relatively spared and is unremarkable. Vascular: No asymmetric hyperdense vasculature at the skull base. Skull: Intact Other: Mastoid air cells and middle ear cavities are clear. CT MAXILLOFACIAL FINDINGS Osseous: No fracture or mandibular dislocation. No destructive process. Numerous absent teeth. Periapical erosions and large dental carie involving  the of the residual right mandibular molar. Orbits: Negative. No traumatic or inflammatory finding. Sinuses: Clear. Soft tissues: There is asymmetric soft tissue swelling involving the right parasagittal lower lip. Facial soft tissues are otherwise unremarkable. CT CERVICAL SPINE FINDINGS Alignment: Normal. Skull base and vertebrae: No acute fracture. No primary bone lesion or focal pathologic process. Soft tissues and spinal canal: No prevertebral fluid or swelling. No visible canal hematoma. Disc levels: Ureter mild endplate remodeling and disc space narrowing is seen at C5-6 and C6-7 in keeping with changes of mild degenerative disc disease. Prevertebral soft tissues are not thickened. Spinal canal is widely patent. No significant neuroforaminal narrowing. Upper chest: Negative. Other: None IMPRESSION: 1. No acute intracranial abnormality. No calvarial fracture. 2. Moderate global parenchymal volume loss, relatively advanced given the patient's age. 3. No acute facial fracture. 4. No acute fracture or listhesis of the cervical spine. 5. Mild  degenerative disc disease at C5-6 and C6-7. 6. Asymmetric soft tissue swelling involving the right parasagittal lower lip. Electronically Signed   By: Dorethia Molt M.D.   On: 01/13/2024 03:18   CT CERVICAL SPINE WO CONTRAST Result Date: 01/13/2024 CLINICAL DATA:  Neck trauma, dangerous injury mechanism (Age 58-64y); Head trauma, moderate-severe; Facial trauma, blunt EXAM: CT HEAD WITHOUT CONTRAST CT MAXILLOFACIAL WITHOUT CONTRAST CT CERVICAL SPINE WITHOUT CONTRAST TECHNIQUE: Multidetector CT imaging of the head, cervical spine, and maxillofacial structures were performed using the standard protocol without intravenous contrast. Multiplanar CT image reconstructions of the cervical spine and maxillofacial structures were also generated. RADIATION DOSE REDUCTION: This exam was performed according to the departmental dose-optimization program which includes automated  exposure control, adjustment of the mA and/or kV according to patient size and/or use of iterative reconstruction technique. COMPARISON:  None Available. FINDINGS: CT HEAD FINDINGS Brain: Normal anatomic configuration. Moderate global parenchymal volume loss is relatively advanced given the patient's age. Mild periventricular white matter changes are present likely reflecting the sequela of small vessel ischemia. 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 No abnormal intra or extra-axial mass lesion or fluid collection. No abnormal mass effect or midline shift. No evidence of acute intracranial hemorrhage or infarct. Borderline ventriculomegaly is commensurate with the degree of parenchymal volume loss and likely represents ex vacuo dilation. Cerebellum is relatively spared and is unremarkable. Vascular: No asymmetric hyperdense vasculature at the skull base. Skull: Intact Other: Mastoid air cells and middle ear cavities are clear. CT MAXILLOFACIAL FINDINGS Osseous: No fracture or mandibular dislocation. No destructive process. Numerous absent teeth. Periapical erosions and large dental carie involving the of the residual right mandibular molar. Orbits: Negative. No traumatic or inflammatory finding. Sinuses: Clear. Soft tissues: There is asymmetric soft tissue swelling involving the right parasagittal lower lip. Facial soft tissues are otherwise unremarkable. CT CERVICAL SPINE FINDINGS Alignment: Normal. Skull base and vertebrae: No acute fracture. No primary bone lesion or focal pathologic process. Soft tissues and spinal canal: No prevertebral fluid or swelling. No visible canal hematoma. Disc levels: Ureter mild endplate remodeling and disc space narrowing is seen at C5-6 and C6-7 in keeping with changes of mild degenerative disc disease. Prevertebral soft tissues are not thickened. Spinal canal is widely patent. No significant neuroforaminal narrowing. Upper chest: Negative. Other: None IMPRESSION:  1. No acute intracranial abnormality. No calvarial fracture. 2. Moderate global parenchymal volume loss, relatively advanced given the patient's age. 3. No acute facial fracture. 4. No acute fracture or listhesis of the cervical spine. 5. Mild degenerative disc disease at C5-6 and C6-7. 6. Asymmetric soft tissue swelling involving the right parasagittal lower lip. Electronically Signed   By: Dorethia Molt M.D.   On: 01/13/2024 03:18     Procedures   Medications Ordered in the ED - No data to display                                  Medical Decision Making Amount and/or Complexity of Data Reviewed Radiology: ordered.   Presents to the department for evaluation after ground-level fall.  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  Patient struck her face on the bathtub when she fell in her bathroom tonight.  CT head, maxillofacial bones, cervical spine performed.  No acute abnormalities noted.  Patient with a superficial, macerated intraoral laceration of the lower lip that does not require  repair.  Will cover her empirically with antibiotics.     Final diagnoses:  Contusion of face, initial encounter  Lip laceration, initial encounter    ED Discharge Orders          Ordered    amoxicillin  (AMOXIL ) 500 MG capsule  3 times daily        01/13/24 0418               Haze Lonni PARAS, MD 01/13/24 [phone]

## 2024-01-13 NOTE — ED Triage Notes (Signed)
 Pt reports mechanical fall in bathroom unwitnessed, pt fell forward falling into tub hitting her face, pt has a lip lac, pt endorses r sided neck pain. Pt has hx of huntington's with more progressive falls lately. Denies LOC.
